# Patient Record
Sex: Male | Born: 1975 | Race: White | Hispanic: No | Marital: Married | Smoking: Never smoker
Health system: Southern US, Community
[De-identification: ages and names within clinical notes are randomized; demographics above are authoritative.]

## PROBLEM LIST (undated history)

## (undated) DIAGNOSIS — T7840XA Allergy, unspecified, initial encounter: Secondary | ICD-10-CM

## (undated) DIAGNOSIS — E119 Type 2 diabetes mellitus without complications: Secondary | ICD-10-CM

## (undated) DIAGNOSIS — Z8489 Family history of other specified conditions: Secondary | ICD-10-CM

## (undated) DIAGNOSIS — F32A Depression, unspecified: Secondary | ICD-10-CM

## (undated) DIAGNOSIS — K623 Rectal prolapse: Secondary | ICD-10-CM

## (undated) DIAGNOSIS — K219 Gastro-esophageal reflux disease without esophagitis: Secondary | ICD-10-CM

## (undated) DIAGNOSIS — S82852A Displaced trimalleolar fracture of left lower leg, initial encounter for closed fracture: Secondary | ICD-10-CM

## (undated) DIAGNOSIS — S83207A Unspecified tear of unspecified meniscus, current injury, left knee, initial encounter: Secondary | ICD-10-CM

## (undated) DIAGNOSIS — F329 Major depressive disorder, single episode, unspecified: Secondary | ICD-10-CM

## (undated) DIAGNOSIS — G4730 Sleep apnea, unspecified: Secondary | ICD-10-CM

## (undated) DIAGNOSIS — E785 Hyperlipidemia, unspecified: Secondary | ICD-10-CM

## (undated) DIAGNOSIS — F419 Anxiety disorder, unspecified: Secondary | ICD-10-CM

## (undated) DIAGNOSIS — I10 Essential (primary) hypertension: Secondary | ICD-10-CM

## (undated) DIAGNOSIS — J302 Other seasonal allergic rhinitis: Secondary | ICD-10-CM

## (undated) HISTORY — DX: Allergy, unspecified, initial encounter: T78.40XA

## (undated) HISTORY — DX: Gastro-esophageal reflux disease without esophagitis: K21.9

## (undated) HISTORY — DX: Rectal prolapse: K62.3

## (undated) HISTORY — DX: Unspecified tear of unspecified meniscus, current injury, left knee, initial encounter: S83.207A

## (undated) HISTORY — PX: FOOT SURGERY: SHX648

## (undated) HISTORY — DX: Anxiety disorder, unspecified: F41.9

## (undated) HISTORY — DX: Sleep apnea, unspecified: G47.30

---

## 2005-08-07 DIAGNOSIS — S83207A Unspecified tear of unspecified meniscus, current injury, left knee, initial encounter: Secondary | ICD-10-CM

## 2005-08-07 HISTORY — DX: Unspecified tear of unspecified meniscus, current injury, left knee, initial encounter: S83.207A

## 2008-11-07 ENCOUNTER — Emergency Department (HOSPITAL_COMMUNITY): Admission: EM | Admit: 2008-11-07 | Discharge: 2008-11-07 | Payer: Self-pay | Admitting: Emergency Medicine

## 2008-11-17 ENCOUNTER — Emergency Department (HOSPITAL_COMMUNITY): Admission: EM | Admit: 2008-11-17 | Discharge: 2008-11-17 | Payer: Self-pay | Admitting: Emergency Medicine

## 2008-12-26 ENCOUNTER — Inpatient Hospital Stay (HOSPITAL_COMMUNITY): Admission: EM | Admit: 2008-12-26 | Discharge: 2008-12-27 | Payer: Self-pay | Admitting: Emergency Medicine

## 2010-06-01 IMAGING — CR DG CHEST 1V PORT
1 series · 1 of 1 positions shown · non-contrast
Comparison: None.

CLINICAL DATA: Chest pain

CHEST - 1 VIEW

[view not recorded]
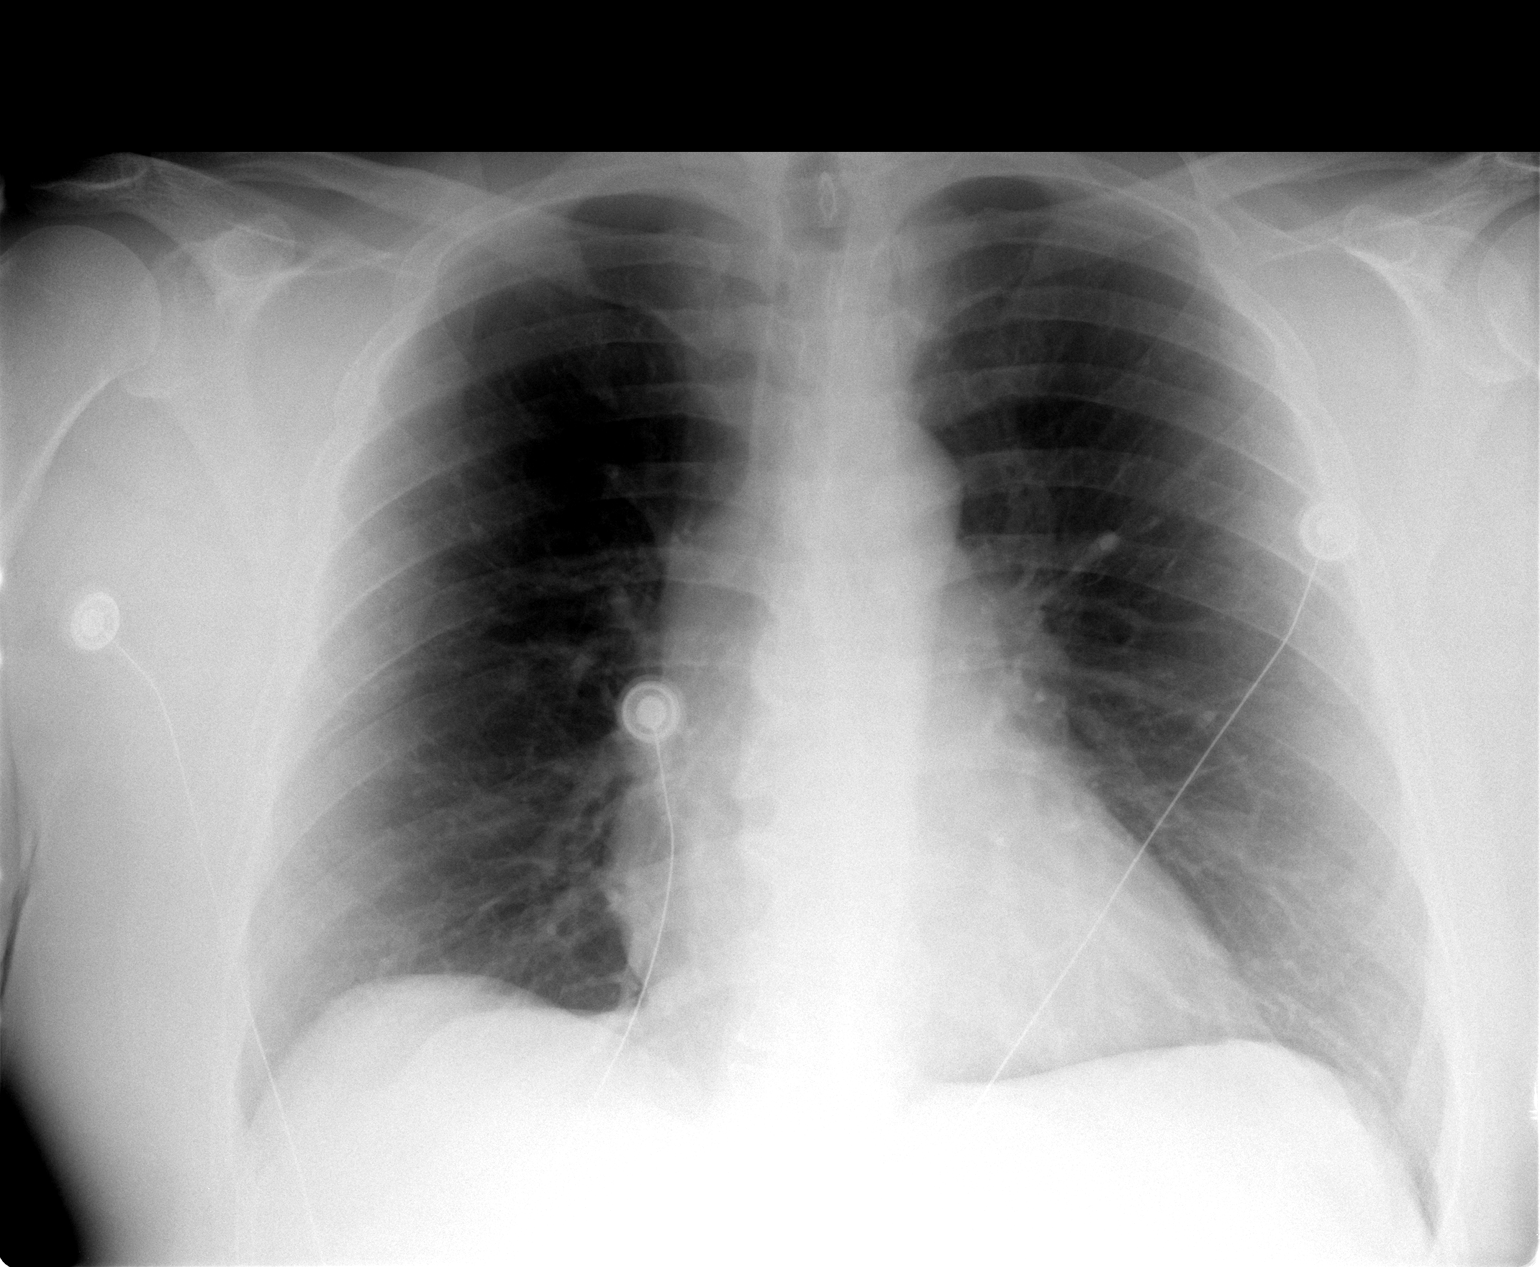

[1 of 1 positions shown; findings below may reference images not displayed]

FINDINGS: The heart size and mediastinal contours are within normal
limits.  Both lungs are clear.
IMPRESSION: No active disease.

## 2010-11-15 LAB — DIFFERENTIAL
Eosinophils Relative: 1 % (ref 0–5)
Lymphocytes Relative: 49 % — ABNORMAL HIGH (ref 12–46)
Lymphs Abs: 2.7 10*3/uL (ref 0.7–4.0)
Monocytes Absolute: 0.4 10*3/uL (ref 0.1–1.0)

## 2010-11-15 LAB — BASIC METABOLIC PANEL
BUN: 13 mg/dL (ref 6–23)
CO2: 27 mEq/L (ref 19–32)
Calcium: 9.1 mg/dL (ref 8.4–10.5)
Chloride: 101 mEq/L (ref 96–112)
Creatinine, Ser: 1.03 mg/dL (ref 0.4–1.5)
GFR calc Af Amer: >60 mL/min (ref >60)
GFR calc non Af Amer: >60 mL/min (ref >60)
Glucose, Bld: 128 mg/dL — ABNORMAL HIGH (ref 70–99)
Potassium: 3.6 mEq/L (ref 3.5–5.1)
Sodium: 135 mEq/L (ref 135–145)

## 2010-11-15 LAB — CARDIAC PANEL(CRET KIN+CKTOT+MB+TROPI)
CK, MB: 2.9 ng/mL (ref 0.3–4.0)
CK, MB: 3.8 ng/mL (ref 0.3–4.0)
Relative Index: 1.5 (ref 0.0–2.5)
Relative Index: 1.6 (ref 0.0–2.5)
Total CK: 167 U/L (ref 7–232)
Total CK: 197 U/L (ref 7–232)
Troponin I: <0.01 ng/mL (ref 0.00–0.06)
Troponin I: <0.01 ng/mL (ref 0.00–0.06)
Troponin I: <0.01 ng/mL (ref 0.00–0.06)

## 2010-11-15 LAB — CBC
HCT: 43.2 % (ref 39.0–52.0)
Hemoglobin: 14.8 g/dL (ref 13.0–17.0)
MCHC: 34.2 g/dL (ref 30.0–36.0)
MCV: 87 fL (ref 78.0–100.0)
Platelets: 256 10*3/uL (ref 150–400)
RBC: 4.97 MIL/uL (ref 4.22–5.81)
RDW: 13.1 % (ref 11.5–15.5)
WBC: 5.6 10*3/uL (ref 4.0–10.5)

## 2010-11-15 LAB — LIPID PANEL
Cholesterol: 145 mg/dL (ref 0–200)
HDL: 19 mg/dL — ABNORMAL LOW (ref >39)
LDL Cholesterol: UNDETERMINED mg/dL (ref 0–99)
Total CHOL/HDL Ratio: 7.6 RATIO
Triglycerides: 431 mg/dL — ABNORMAL HIGH (ref <150)
VLDL: UNDETERMINED mg/dL (ref 0–40)

## 2010-11-15 LAB — GLUCOSE, CAPILLARY
Glucose-Capillary: 132 mg/dL — ABNORMAL HIGH (ref 70–99)
Glucose-Capillary: 139 mg/dL — ABNORMAL HIGH (ref 70–99)
Glucose-Capillary: 143 mg/dL — ABNORMAL HIGH (ref 70–99)

## 2010-11-15 LAB — TROPONIN I: Troponin I: <0.01 ng/mL (ref 0.00–0.06)

## 2010-11-16 LAB — GLUCOSE, CAPILLARY: Glucose-Capillary: 170 mg/dL — ABNORMAL HIGH (ref 70–99)

## 2010-12-20 NOTE — Discharge Summary (Signed)
NAME:  Brad Pineda, Brad Pineda NO.:  [id number]   MEDICAL RECORD NO.:  [id number]          PATIENT TYPE:  INP   LOCATION:  [place]                         FACILITY:  [facility]   PHYSICIAN:  Jacquelynn Cree, M.D.   DATE OF BIRTH:  [birth year]   DATE OF ADMISSION:  12/26/2008  DATE OF DISCHARGE:  12/27/2008                               DISCHARGE SUMMARY   PRIMARY CARE PHYSICIAN:  Dr. Brunetta Jeans at [facility].   DISCHARGE DIAGNOSES:  1. Atypical chest pain.  2. History of hypertension.  3. Dyslipidemia.  4. Type 2 diabetes.  5. Overweight.   DISCHARGE MEDICATIONS:  1. Metformin 500 mg p.o. b.i.d.  2. Pravastatin 40 mg p.o. daily.  3. Paxil 20 mg p.o. daily.  4. Hydrochlorothiazide/lisinopril 12.5/10 mg p.o. daily.  5. Tricor 145 mg p.o. daily.  6. Aspirin 81 mg p.o. daily.   CONSULTATIONS:  None.   Brief admission HPI:  The patient is a 35 year old male with past  medical history of hypertension, type 2 diabetes, and dyslipidemia who  presented to the hospital with chief complaint of stabbing type chest  pain that was atypical and nonexertional.  The patient was admitted for  acute coronary syndrome rule-out given his medical comorbidities.  For  the full details, please see the dictated report done by Dr. Hal Hope.   PROCEDURES AND DIAGNOSTIC STUDIES:  1. CT angiogram of the chest on Dec 26, 2008 was negative for      pulmonary embolism.  2. Chest X-Ray on Dec 24, 2008 showed no active disease.   DISCHARGE LABORATORY VALUES:  Cholesterol was 145, triglycerides 431,  HDL 19, LDL not calculated.  TSH was 0.993.  Hemoglobin A1c was 5.9%.  Cardiac markers were negative times two sets.  However, the first set of  markers did show nonspecific elevation of total CK at 284 and total CK  MB at 4.8.  The index was low at 1.7.  Chemistries were unremarkable  except for a mildly elevated glucose of 128.  CBC was within normal  limits.   HOSPITAL COURSE:  1. Atypical  chest pain:  The patient was admitted secondary to his      comorbidities to rule out acute coronary syndrome.  There is no      family history of early coronary disease.  The patient's risk      factor profile included dyslipidemia, hypertension, type 2      diabetes.  It appears that his blood pressure is well-controlled      and that his diabetes is well-controlled.  A review of his      cholesterol profile does show inadequate control of the      triglycerides and the patient was subsequently put on Tricor for      better control of this.  Given the fact that he is chest pain-free,      that his pain was markedly atypical, that he has had negative      troponins, and that his 12-lead EKG showed normal sinus rhythm with      no ST or T-wave changes, the  patient is felt to be a very low risk      for a cardiac source of his pain.  The patient is therefore being      discharged home with instructions to follow up with his primary      care physician, Dr. Gretel Acre, if he should experience any problems      with ongoing chest pain to be set up for a stress test.  2. Hypertension:  The patient's blood pressure has been well      controlled on his outpatient regimen.  3. Dyslipidemia:  The patient does have an adequate triglyceride      control and he has been put on TriCor to achieve better      triglyceride control.  He was also advised to maintain a heart-      healthy diet and to incorporate exercise into his daily routine.  4. Type 2 diabetes:  The patient's hemoglobin A1c is 5.9% which      corresponds to a mean plasma glucose of 123.  He has excellent      control.  5. Overweight:  The patient does indicate that he exercises regularly      and he has been encouraged to continue to do this.   DISPOSITION:  The patient is medically stable and will be discharged  home to follow up with his primary care physician for his routine  medical care and for any return of problems with chest  pain.   Time spent coordinating care for discharge and discharge instructions  equals 25 minutes.      Jacquelynn Cree, M.D.  Electronically Signed     CR/MEDQ  D:  12/27/2008  T:  12/27/2008  Job:  [id number]   cc:   Dellis Filbert, MD [facility]

## 2010-12-20 NOTE — H&P (Signed)
NAME:  EVERRETT, LACASSE NO.:  [id number]   MEDICAL RECORD NO.:  [id number]          PATIENT TYPE:  EMS   LOCATION:  ED                           [facility]ACILITY[facility]  [facility]   PHYSICIAN:  Rise Patience, MDDATE OF BIRTH:  1976-07-25   DATE OF ADMISSION:  12/26/2008  DATE OF DISCHARGE:                              HISTORY & PHYSICAL   PRIMARY CARE PHYSICIAN:  Brunetta Jeans at [facility].   CHIEF COMPLAINT:  Chest pain.   HISTORY OF PRESENT ILLNESS:  A 35 year old male with a history of  hypertension, diabetes mellitus type 2 and hyperlipidemia presented to  the ER complaining of chest pain.  The patient started out having chest  pain early in the a.m., 6 a.m.; he was walking and he started having  some stabbing type of chest pain, diffusely over the anterior chest.  It  is not particularly related to walking or taking a rest.  It lasts a few  seconds and returns every 15 minutes and he decided to come to the ER.  Presently, the patient has chest pain and has been admitted for further  evaluation.  The patient denies any associated shortness of breath,  nausea, vomiting, diaphoresis, dizziness or loss of consciousness or any  loss of function of limbs.  Denies any headaches, fever or chills,  cough, dysuria, discharge or diarrhea.  Denies any abdominal pain.   PAST MEDICAL HISTORY:  1. Hypertension.  2. Hyperlipidemia.  3. Diabetes mellitus type 2.   PAST SURGICAL HISTORY:  None.   MEDICATIONS ON ADMISSION:  1. Metformin 5 mg p.o. b.i.d.  2. Pravastatin 40 mg daily.  3. Paxil 20 mg daily.  4. Hydrochlorothiazide/lisinopril 1.5/10 mg daily.   ALLERGIES:  NO KNOWN DRUG ALLERGIES.   FAMILY HISTORY:  Noncontributory.  Specifically there is no history of  CAD.   SOCIAL HISTORY:  The patient denies smoking cigarettes, drinking alcohol  or using illegal drugs.   REVIEW OF SYSTEMS:  Per history of presenting illness.  Nothing else of  significance.   PHYSICAL  EXAMINATION:  The patient is examined at bedside, not in acute  distress.  VITAL SIGNS: Blood pressure is 124/78, pulse 86 per minute, temperature  100, respirations 18 per minute, O2 saturation 98%. Marland Kitchen  HEENT: Anicteric.  No pallor.  CHEST:  Bilateral air entry present.  No rhonchi or crepitation.  HEART:  S1, S2 hear.  ABDOMEN:  Soft, nontender.  Bowel sounds heard.  CNS:  Alert, awake and oriented to time, place and person.  Moves upper  and lower extremities 5/5.  EXTREMITIES:  Peripheral pulses felt.  No edema.   LABS:  EKG normal sinus rhythm with no acute ST changes.  Chest X-Ray:  No active disease.  CBC: WBC is 5.6, hemoglobin 14.8, hematocrit 43.2,  platelets 256, neutrophils 43%, glucose 139.  Basic metabolic panel:  Sodium 096, potassium 3.3, chloride 101, carbon dioxide 27, glucose 128,  BUN 13, creatinine 1, CK 284, CK-MB 4.8, troponin-I less than 0.01.   ASSESSMENT:  1. Chest pain, rule-out ACS.  2. Hypertension.  3. Diabetes mellitus type  2.  4. Hyperlipidemia. Marland Kitchen   PLAN:  Admit the patient to Telemetry, cycle cardiac markers.  We will  get a 2-D echocardiogram.  Given the patient's risk factors, the patient  will definitely need a stress test.  We will also get a CT of the chest  to rule out any dissection or embolism.      Rise Patience, MD  Electronically Signed     ANK/MEDQ  D:  12/26/2008  T:  12/26/2008  Job:  [id number]

## 2011-08-21 ENCOUNTER — Encounter: Payer: Self-pay | Admitting: Physician Assistant

## 2011-08-21 DIAGNOSIS — E782 Mixed hyperlipidemia: Secondary | ICD-10-CM | POA: Insufficient documentation

## 2011-08-21 DIAGNOSIS — I10 Essential (primary) hypertension: Secondary | ICD-10-CM | POA: Insufficient documentation

## 2011-08-21 DIAGNOSIS — F419 Anxiety disorder, unspecified: Secondary | ICD-10-CM | POA: Insufficient documentation

## 2011-08-21 DIAGNOSIS — K219 Gastro-esophageal reflux disease without esophagitis: Secondary | ICD-10-CM | POA: Insufficient documentation

## 2011-08-21 DIAGNOSIS — E1165 Type 2 diabetes mellitus with hyperglycemia: Secondary | ICD-10-CM | POA: Insufficient documentation

## 2011-10-17 ENCOUNTER — Ambulatory Visit (INDEPENDENT_AMBULATORY_CARE_PROVIDER_SITE_OTHER): Payer: BC Managed Care – PPO | Admitting: Physician Assistant

## 2011-10-17 ENCOUNTER — Encounter: Payer: Self-pay | Admitting: Physician Assistant

## 2011-10-17 VITALS — BP 121/82 | HR 71 | Temp 97.9°F | Resp 18 | Ht 68.0 in | Wt 212.4 lb

## 2011-10-17 DIAGNOSIS — I10 Essential (primary) hypertension: Secondary | ICD-10-CM

## 2011-10-17 DIAGNOSIS — E782 Mixed hyperlipidemia: Secondary | ICD-10-CM

## 2011-10-17 DIAGNOSIS — Z79899 Other long term (current) drug therapy: Secondary | ICD-10-CM

## 2011-10-17 DIAGNOSIS — E785 Hyperlipidemia, unspecified: Secondary | ICD-10-CM

## 2011-10-17 DIAGNOSIS — E119 Type 2 diabetes mellitus without complications: Secondary | ICD-10-CM

## 2011-10-17 LAB — COMPREHENSIVE METABOLIC PANEL
AST: 32 U/L (ref 0–37)
BUN: 15 mg/dL (ref 6–23)
CO2: 26 mEq/L (ref 19–32)
Calcium: 9.5 mg/dL (ref 8.4–10.5)
Chloride: 101 mEq/L (ref 96–112)
Creat: 1.02 mg/dL (ref 0.50–1.35)
Glucose, Bld: 195 mg/dL — ABNORMAL HIGH (ref 70–99)

## 2011-10-17 LAB — LIPID PANEL
Cholesterol: 131 mg/dL (ref 0–200)
HDL: 24 mg/dL — ABNORMAL LOW (ref >39)
Total CHOL/HDL Ratio: 5.5 Ratio
Triglycerides: 205 mg/dL — ABNORMAL HIGH (ref <150)

## 2011-10-17 MED ORDER — METFORMIN HCL 500 MG PO TABS: 500.0000 mg | ORAL_TABLET | Freq: Two times a day (BID) | ORAL | Status: DC

## 2011-10-17 MED ORDER — METFORMIN HCL 500 MG PO TABS: 1000.0000 mg | ORAL_TABLET | Freq: Two times a day (BID) | ORAL | Status: DC

## 2011-10-17 MED ORDER — NIACIN ER (ANTIHYPERLIPIDEMIC) 500 MG PO TBCR: 500.0000 mg | EXTENDED_RELEASE_TABLET | Freq: Every day | ORAL | Status: DC

## 2011-10-17 MED ORDER — HYDROCHLOROTHIAZIDE 25 MG PO TABS: 25.0000 mg | ORAL_TABLET | Freq: Every day | ORAL | Status: DC

## 2011-10-17 MED ORDER — LOSARTAN POTASSIUM 50 MG PO TABS: 50.0000 mg | ORAL_TABLET | Freq: Every day | ORAL | Status: DC

## 2011-10-17 NOTE — Progress Notes (Signed)
  Subjective:    Patient ID: Brad Pineda, male    DOB: 11/18/1975, 36 y.o.   MRN: [id number]  HPI This patient presents for follow-up of DM, type 2, HTN, elevated lipids.  He reports feeling well, without changes in his life at home or work.  Does not check his blood sugar at home.   Review of Systems  Constitutional: Negative for activity change, appetite change, fatigue and unexpected weight change.  HENT: Negative for hearing loss, ear pain, congestion, sore throat, rhinorrhea, sneezing, mouth sores, trouble swallowing, neck stiffness, dental problem, postnasal drip and tinnitus.   Eyes: Negative for photophobia, pain, redness and visual disturbance.  Respiratory: Negative for cough, chest tightness and shortness of breath.   Cardiovascular: Negative for chest pain, palpitations and leg swelling.  Gastrointestinal: Negative for nausea, vomiting, abdominal pain, diarrhea, constipation and blood in stool.  Genitourinary: Negative for dysuria, urgency, frequency and hematuria.  Musculoskeletal: Negative for myalgias, arthralgias and gait problem.  Skin: Negative for rash.  Neurological: Negative for dizziness, speech difficulty, weakness, light-headedness, numbness and headaches.  Hematological: Negative for adenopathy.  Psychiatric/Behavioral: Negative for confusion and sleep disturbance. The patient is not nervous/anxious.        Objective:   Physical Exam  Vital signs noted. Well-developed, well nourished WM who is awake, alert and oriented, in NAD. HEENT: [place]/AT, PERRL, EOMI.  Sclera and conjunctiva are clear.  EAC are patent, TMs are normal in appearance. Nasal mucosa is pink and moist. OP is clear. Neck: supple, non-tender, no lymphadenopathey, thyromegaly. Heart: RRR, no murmur Lungs: CTA Abdomen: normo-active bowel sounds, supple, non-tender, no mass or organomegaly. Extremities: no cyanosis, clubbing or edema. Skin: warm and dry without rash.       Assessment & Plan:

## 2011-10-17 NOTE — Assessment & Plan Note (Signed)
Controlled.  Continue current treatment plan.

## 2011-10-17 NOTE — Patient Instructions (Signed)
Keep working on healthy eating and regular exercise!

## 2011-10-17 NOTE — Assessment & Plan Note (Signed)
Await labs today.  Continue current treatment plan.

## 2011-10-17 NOTE — Assessment & Plan Note (Signed)
Uncontrolled today with A1C 9.0%.  That's up from 6.0% a year ago!  Increase metformin to 1000 mg BID and re-educated on the importance of healthy eating choices and regular exercise.

## 2011-10-19 ENCOUNTER — Other Ambulatory Visit: Payer: Self-pay | Admitting: *Deleted

## 2011-10-20 ENCOUNTER — Encounter: Payer: Self-pay | Admitting: Physician Assistant

## 2011-10-23 ENCOUNTER — Other Ambulatory Visit: Payer: Self-pay | Admitting: Physician Assistant

## 2011-10-25 ENCOUNTER — Other Ambulatory Visit: Payer: Self-pay | Admitting: Physician Assistant

## 2011-12-07 ENCOUNTER — Encounter: Payer: Self-pay | Admitting: Physician Assistant

## 2011-12-09 ENCOUNTER — Other Ambulatory Visit: Payer: Self-pay | Admitting: Physician Assistant

## 2012-01-23 ENCOUNTER — Encounter: Payer: Self-pay | Admitting: Physician Assistant

## 2012-01-23 ENCOUNTER — Ambulatory Visit (INDEPENDENT_AMBULATORY_CARE_PROVIDER_SITE_OTHER): Payer: BC Managed Care – PPO | Admitting: Physician Assistant

## 2012-01-23 VITALS — BP 127/84 | HR 92 | Temp 98.4°F | Resp 16 | Ht 68.0 in | Wt 206.2 lb

## 2012-01-23 DIAGNOSIS — E785 Hyperlipidemia, unspecified: Secondary | ICD-10-CM

## 2012-01-23 DIAGNOSIS — I10 Essential (primary) hypertension: Secondary | ICD-10-CM

## 2012-01-23 DIAGNOSIS — E782 Mixed hyperlipidemia: Secondary | ICD-10-CM

## 2012-01-23 DIAGNOSIS — K219 Gastro-esophageal reflux disease without esophagitis: Secondary | ICD-10-CM

## 2012-01-23 DIAGNOSIS — F419 Anxiety disorder, unspecified: Secondary | ICD-10-CM

## 2012-01-23 DIAGNOSIS — E119 Type 2 diabetes mellitus without complications: Secondary | ICD-10-CM

## 2012-01-23 LAB — LIPID PANEL
HDL: 25 mg/dL — ABNORMAL LOW (ref >39)
LDL Cholesterol: 70 mg/dL (ref 0–99)
VLDL: 47 mg/dL — ABNORMAL HIGH (ref 0–40)

## 2012-01-23 LAB — COMPREHENSIVE METABOLIC PANEL
AST: 32 U/L (ref 0–37)
Albumin: 4.8 g/dL (ref 3.5–5.2)
Alkaline Phosphatase: 51 U/L (ref 39–117)
BUN: 19 mg/dL (ref 6–23)
Potassium: 3.6 mEq/L (ref 3.5–5.3)
Sodium: 135 mEq/L (ref 135–145)
Total Bilirubin: 0.8 mg/dL (ref 0.3–1.2)

## 2012-01-23 LAB — GLUCOSE, POCT (MANUAL RESULT ENTRY): POC Glucose: 199 mg/dl — AB (ref 70–99)

## 2012-01-23 MED ORDER — PRAVASTATIN SODIUM 40 MG PO TABS: 40.0000 mg | ORAL_TABLET | Freq: Every evening | ORAL | Status: DC

## 2012-01-23 MED ORDER — SITAGLIPTIN PHOSPHATE 100 MG PO TABS: 100.0000 mg | ORAL_TABLET | Freq: Every day | ORAL | Status: DC

## 2012-01-23 NOTE — Progress Notes (Signed)
  Subjective:    Patient ID: Brad Pineda, male    DOB: August 03, 1976, 36 y.o.   MRN: [id number]  HPI Presents for re-evaluation of DM type 2, HTN, elevated lipids (with steatohepatitis), GERD, anxiety.  Nausea and vomiting with increased dose of metformin, so went back to 500 mg BID.  Dizziness if blood sugar drops (when delays/skips meals). Doesn't check frequently.  Not current with DDS and eye exams, due to cost.  Review of Systems No chest pain, SOB, HA, dizziness, vision change, N/V, diarrhea, constipation, dysuria, urinary urgency or frequency, myalgias, arthralgias or rash.     Objective:   Physical Exam  Vital signs noted. Well-developed, well nourished WM who is awake, alert and oriented, in NAD. HEENT: [place]/AT, sclera and conjunctiva are clear.   Neck: supple, non-tender, no lymphadenopathy, thyromegaly. Heart: RRR, no murmur Lungs: CTA Extremities: no cyanosis, clubbing or edema. Skin: warm and dry without rash.  Results for orders placed in visit on 01/23/12  GLUCOSE, POCT (MANUAL RESULT ENTRY)      Component Value Range   POC Glucose 199 (*) 70 - 99 mg/dl  POCT GLYCOSYLATED HEMOGLOBIN (HGB A1C)      Component Value Range   Hemoglobin A1C 9.6          Assessment & Plan:

## 2012-01-23 NOTE — Assessment & Plan Note (Signed)
Continue increasing efforts for healthy lifestyle.  Continue current regimen.

## 2012-01-23 NOTE — Assessment & Plan Note (Signed)
Continue current treatment.

## 2012-01-23 NOTE — Assessment & Plan Note (Signed)
Controlled. Continue increasing efforts for healthy lifestyle.  Continue current regimen.

## 2012-01-23 NOTE — Assessment & Plan Note (Signed)
Intolerant to increased dose of metformin. Add Januvia, 100 mg daily.  Continue metformin at 500 mg BID.  Consider change to Janumet, or a third dose of metformin 500 mg midday.

## 2012-01-24 ENCOUNTER — Encounter: Payer: Self-pay | Admitting: Physician Assistant

## 2012-02-24 ENCOUNTER — Ambulatory Visit (INDEPENDENT_AMBULATORY_CARE_PROVIDER_SITE_OTHER): Payer: BC Managed Care – PPO | Admitting: Internal Medicine

## 2012-02-24 ENCOUNTER — Encounter: Payer: Self-pay | Admitting: Internal Medicine

## 2012-02-24 VITALS — BP 130/88 | HR 86 | Temp 98.1°F | Resp 16 | Ht 68.0 in | Wt 207.6 lb

## 2012-02-24 DIAGNOSIS — J40 Bronchitis, not specified as acute or chronic: Secondary | ICD-10-CM

## 2012-02-24 DIAGNOSIS — R5383 Other fatigue: Secondary | ICD-10-CM

## 2012-02-24 DIAGNOSIS — E119 Type 2 diabetes mellitus without complications: Secondary | ICD-10-CM

## 2012-02-24 DIAGNOSIS — R5381 Other malaise: Secondary | ICD-10-CM

## 2012-02-24 LAB — POCT CBC
HCT, POC: 50.5 % (ref 43.5–53.7)
Hemoglobin: 15.7 g/dL (ref 14.1–18.1)
MCH, POC: 27.8 pg (ref 27–31.2)
MCV: 89.5 fL (ref 80–97)
MID (cbc): 0.5 (ref 0–0.9)
Platelet Count, POC: 8.1 10*3/uL — AB (ref 142–424)
RBC: 5.64 M/uL (ref 4.69–6.13)
WBC: 9.6 10*3/uL (ref 4.6–10.2)

## 2012-02-24 LAB — POCT GLYCOSYLATED HEMOGLOBIN (HGB A1C): Hemoglobin A1C: 8.4

## 2012-02-24 MED ORDER — HYDROCODONE-ACETAMINOPHEN 7.5-500 MG/15ML PO SOLN
5.0000 mL | Freq: Four times a day (QID) | ORAL | Status: AC | PRN
Start: 2012-02-24 — End: 2012-03-05

## 2012-02-24 MED ORDER — AZITHROMYCIN 500 MG PO TABS: 500.0000 mg | ORAL_TABLET | Freq: Every day | ORAL | Status: AC

## 2012-02-24 NOTE — Progress Notes (Signed)
36 year old [place] is here today with complaints of a sorethroat, cough- brown, dizziness, hoarseness, headache,nasal - clear for 3-4 days.  Pt states he has no other complaints.

## 2012-02-24 NOTE — Progress Notes (Signed)
  Subjective:    Patient ID: Brad Pineda, male    DOB: March 16, 1976, 36 y.o.   MRN: [id number]  HPI Has cough green brown sputum, no sob or cp. NIDDM not to goal by his report. Last visit reviewed.   Review of Systems See list    Objective:   Physical Exam HEENT nl Lungs rhonchi, no rales  Results for orders placed in visit on 02/24/12  POCT CBC      Component Value Range   WBC 9.6  4.6 - 10.2 K/uL   Lymph, poc 3.4  0.6 - 3.4   POC LYMPH PERCENT 34.9  10 - 50 %L   MID (cbc) 0.5  0 - 0.9   POC MID % 4.8  0 - 12 %M   POC Granulocyte 5.8  2 - 6.9   Granulocyte percent 60.3  37 - 80 %G   RBC 5.64  4.69 - 6.13 M/uL   Hemoglobin 15.7  14.1 - 18.1 g/dL   HCT, POC 50.5  43.5 - 53.7 %   MCV 89.5  80 - 97 fL   MCH, POC 27.8  27 - 31.2 pg   MCHC 31.1 (*) 31.8 - 35.4 g/dL   RDW, POC 345     Platelet Count, POC 8.1 (*) 142 - 424 K/uL   MPV    0 - 99.8 fL  GLUCOSE, POCT (MANUAL RESULT ENTRY)      Component Value Range   POC Glucose 170 (*) 70 - 99 mg/dl  POCT GLYCOSYLATED HEMOGLOBIN (HGB A1C)      Component Value Range   Hemoglobin A1C 8.4          Assessment & Plan:  Bronchitis NIDDM

## 2012-02-24 NOTE — Patient Instructions (Addendum)

## 2012-02-29 ENCOUNTER — Ambulatory Visit: Payer: BC Managed Care – PPO

## 2012-02-29 ENCOUNTER — Ambulatory Visit (INDEPENDENT_AMBULATORY_CARE_PROVIDER_SITE_OTHER): Payer: BC Managed Care – PPO | Admitting: Emergency Medicine

## 2012-02-29 VITALS — BP 134/60 | HR 96 | Temp 98.6°F | Resp 18 | Ht 68.0 in | Wt 208.0 lb

## 2012-02-29 DIAGNOSIS — R059 Cough, unspecified: Secondary | ICD-10-CM

## 2012-02-29 DIAGNOSIS — R05 Cough: Secondary | ICD-10-CM

## 2012-02-29 DIAGNOSIS — J309 Allergic rhinitis, unspecified: Secondary | ICD-10-CM

## 2012-02-29 MED ORDER — IPRATROPIUM BROMIDE 0.03 % NA SOLN: 2.0000 | Freq: Two times a day (BID) | NASAL | Status: DC

## 2012-02-29 MED ORDER — AMOXICILLIN 875 MG PO TABS: 875.0000 mg | ORAL_TABLET | Freq: Two times a day (BID) | ORAL | Status: AC

## 2012-02-29 MED ORDER — HYDROCOD POLST-CHLORPHEN POLST 10-8 MG/5ML PO LQCR: 5.0000 mL | Freq: Two times a day (BID) | ORAL | Status: DC | PRN

## 2012-02-29 NOTE — Progress Notes (Signed)
  Subjective:    Patient ID: Brad Pineda, male    DOB: 1976/01/25, 36 y.o.   MRN: [id number]  HPI Patient presents for follow up of congestion. He was seen on 7/20 and was given a Z-pack.  Says he feels slightly better but still has nasal congestion, dry cough, sinus pressure, and fatigue.  He has been taking Lortab as needed for cough. Denies fever, chills, nausea, vomiting, otalgia, or wheezing.  Concerned that he did not get an X-ray at last visit.  Also is concerned about why he seems to be getting so many infections (1-2/year)    Review of Systems  All other systems reviewed and are negative.       Objective:   Physical Exam  Constitutional: He is oriented to person, place, and time. He appears well-developed and well-nourished.  HENT:  Head: Normocephalic and atraumatic.  Right Ear: External ear normal.  Left Ear: External ear normal.  Mouth/Throat: Oropharynx is clear and moist. No oropharyngeal exudate.  Eyes: Conjunctivae are normal.  Neck: Normal range of motion.  Cardiovascular: Normal rate, regular rhythm and normal heart sounds.   Pulmonary/Chest: Effort normal and breath sounds normal.  Musculoskeletal: Normal range of motion.  Lymphadenopathy:    He has no cervical adenopathy.  Neurological: He is alert and oriented to person, place, and time.  Psychiatric: He has a normal mood and affect. His behavior is normal. Judgment and thought content normal.    [facility] reading (PRIMARY) by  Dr. Everlene Farrier as normal CXR.       Assessment & Plan:   1. Cough  DG Chest 2 View, amoxicillin (AMOXIL) 875 MG tablet, chlorpheniramine-HYDROcodone (TUSSIONEX PENNKINETIC ER) 10-8 MG/5ML LQCR  2. Allergic rhinitis  ipratropium (ATROVENT) 0.03 % nasal spray   Will treat with Amoxicillin 875 mg bid x 7 days.  Take Tussionex as needed for cough Use Atrovent NS to help with drainage. Take OTC Zyrtec daily also If no improvement after completion of amoxicillin, refer to ENT for further  evaluation

## 2012-04-30 ENCOUNTER — Ambulatory Visit (INDEPENDENT_AMBULATORY_CARE_PROVIDER_SITE_OTHER): Payer: BC Managed Care – PPO | Admitting: Physician Assistant

## 2012-04-30 ENCOUNTER — Other Ambulatory Visit: Payer: Self-pay | Admitting: Internal Medicine

## 2012-04-30 ENCOUNTER — Encounter: Payer: Self-pay | Admitting: Physician Assistant

## 2012-04-30 VITALS — BP 124/84 | HR 90 | Temp 99.0°F | Resp 16 | Ht 67.0 in | Wt 209.2 lb

## 2012-04-30 DIAGNOSIS — I10 Essential (primary) hypertension: Secondary | ICD-10-CM

## 2012-04-30 DIAGNOSIS — E782 Mixed hyperlipidemia: Secondary | ICD-10-CM

## 2012-04-30 DIAGNOSIS — Z23 Encounter for immunization: Secondary | ICD-10-CM

## 2012-04-30 DIAGNOSIS — E119 Type 2 diabetes mellitus without complications: Secondary | ICD-10-CM

## 2012-04-30 LAB — COMPREHENSIVE METABOLIC PANEL
ALT: 66 U/L — ABNORMAL HIGH (ref 0–53)
CO2: 27 mEq/L (ref 19–32)
Creat: 0.97 mg/dL (ref 0.50–1.35)
Total Bilirubin: 0.6 mg/dL (ref 0.3–1.2)

## 2012-04-30 LAB — GLUCOSE, POCT (MANUAL RESULT ENTRY): POC Glucose: 188 mg/dl — AB (ref 70–99)

## 2012-04-30 LAB — LIPID PANEL
Cholesterol: 129 mg/dL (ref 0–200)
HDL: 26 mg/dL — ABNORMAL LOW (ref >39)
Total CHOL/HDL Ratio: 5 Ratio
VLDL: 29 mg/dL (ref 0–40)

## 2012-04-30 LAB — POCT GLYCOSYLATED HEMOGLOBIN (HGB A1C): Hemoglobin A1C: 8

## 2012-04-30 MED ORDER — SITAGLIPTIN PHOSPHATE 100 MG PO TABS: 100.0000 mg | ORAL_TABLET | Freq: Every day | ORAL | Status: DC

## 2012-04-30 MED ORDER — FENOFIBRATE 145 MG PO TABS: 145.0000 mg | ORAL_TABLET | Freq: Every day | ORAL | Status: DC

## 2012-04-30 NOTE — Assessment & Plan Note (Signed)
Reinforced the need to exercise and make healthy eating choices.  Encouraged him to check his glucose at home, which can reveal areas where he can make changes.  Goal A1C is <7.0%.  RTC 3 months.  Will increase medication regimen at that time if needed.

## 2012-04-30 NOTE — Patient Instructions (Signed)
Start checking your blood sugar, so you can use the results to change the way you eat and exercise and get healthier.

## 2012-04-30 NOTE — Assessment & Plan Note (Signed)
Controlled.  Continue current treatment.  RTC 3 months.

## 2012-04-30 NOTE — Assessment & Plan Note (Signed)
Continue current treatment pending lab results.

## 2012-04-30 NOTE — Progress Notes (Signed)
Subjective:    Patient ID: Brad Pineda, male    DOB: 06/23/1976, 36 y.o.   MRN: [id number]  HPI This 36 y.o. male presents for evaluation of DM, HTN, Lipids.  Frequency of home glucose monitoring: NONE Sees a dentist Q6 months, eye specialist annually. Checks his feet daily. Is current with influenza vaccine. Is current with pneumococcal vaccine.   Review of Systems Denies chest pain, shortness of breath, HA, dizziness, vision change, nausea, vomiting, diarrhea, constipation, melena, hematochezia, dysuria, increased urinary urgency or frequency, increased hunger or thirst, unintentional weight change, unexplained myalgias or arthralgias, rash.  Past Medical History  Diagnosis Date  . Type II or unspecified type diabetes mellitus without mention of complication, uncontrolled   . Essential hypertension, benign   . Mixed hyperlipidemia   . NASH (nonalcoholic steatohepatitis) 04/2007  . GERD (gastroesophageal reflux disease)   . Anxiety   . Glaucoma   . History of meniscal tear 2007    History reviewed. No pertinent past surgical history.  Prior to Admission medications   Medication Sig Start Date End Date Taking? Authorizing Provider  aspirin 81 MG tablet Take 81 mg by mouth once. Takes 3 qd   Yes Historical Provider, MD  fenofibrate (TRICOR) 145 MG tablet TAKE 1 TABLET EVERY DAY 10/23/11  Yes Kemper Durie, PA-C  fish oil-omega-3 fatty acids 1000 MG capsule Take 3 g by mouth daily.   Yes Historical Provider, MD  hydrochlorothiazide (HYDRODIURIL) 25 MG tablet Take 1 tablet (25 mg total) by mouth daily. 10/17/11  Yes Janequa Kipnis S Ryanne Morand, PA-C  ipratropium (ATROVENT) 0.03 % nasal spray Place 2 sprays into the nose 2 (two) times daily. 02/29/12 02/28/13 Yes Heather M Marte, PA-C  lansoprazole (PREVACID) 15 MG capsule Take 15 mg by mouth daily. Rotates with Zegrid OTC 20 mg   Yes Historical Provider, MD  losartan (COZAAR) 50 MG tablet Take 1 tablet (50 mg total) by mouth daily. 10/17/11   Yes Ephrata Verville S Joeziah Voit, PA-C  metFORMIN (GLUCOPHAGE) 500 MG tablet TAKE 1 TABLET TWICE A DAY WITH FOOD 12/09/11  Yes Shakia Sebastiano S Javel Hersh, PA-C  Multiple Vitamins-Minerals (MULTIVITAL PO) Take 1 tablet by mouth daily.   Yes Historical Provider, MD  niacin (NIASPAN) 500 MG CR tablet Take 1 tablet (500 mg total) by mouth at bedtime. 10/17/11  Yes Linley Moskal S Kielee Care, PA-C  Omeprazole-Sodium Bicarbonate (ZEGERID PO) Take by mouth once.   Yes Historical Provider, MD  PARoxetine (PAXIL) 40 MG tablet TAKE 1 TABLET EVERY DAY 10/23/11  Yes Kemper Durie, PA-C  pravastatin (PRAVACHOL) 40 MG tablet Take 1 tablet (40 mg total) by mouth every evening. 01/23/12  Yes Lusia Greis S Ralph Brouwer, PA-C  sitaGLIPtin (JANUVIA) 100 MG tablet Take 1 tablet (100 mg total) by mouth daily. 01/23/12 01/22/13 Yes Edmundo Tedesco S Manuelito Poage, PA-C    No Known Allergies  History   Social History  . Marital Status: Married    Spouse Name: [name]    Number of Children: 0  . Years of Education: N/A   Occupational History  .  [employer]   Social History Main Topics  . Smoking status: Never Smoker   . Smokeless tobacco: Never Used  . Alcohol Use: No  . Drug Use: No  . Sexually Active: Yes -- Male partner(s)   Family History  Problem Relation Age of Onset  . Kidney disease Mother   . Diabetes Father   . Hypertension Father        Objective:   Physical Exam  Blood  pressure 124/84, pulse 90, temperature 99 F (37.2 C), temperature source Oral, resp. rate 16, height 5' 7"  (1.702 m), weight 209 lb 3.2 oz (94.892 kg), SpO2 97.00%. Body mass index is 32.77 kg/(m^2). Well-developed, well nourished WM who is awake, alert and oriented, in NAD. HEENT: [place]/AT, PERRL, EOMI.  Sclera and conjunctiva are clear.  EAC are patent, TMs are normal in appearance. Nasal mucosa is pink and moist. OP is clear. Neck: supple, non-tender, no lymphadenopathy, thyromegaly. Heart: RRR, no murmur Lungs: normal effort, CTA Extremities: no cyanosis, clubbing or  edema. Skin: warm and dry without rash. See DM foot exam.  Results for orders placed in visit on 04/30/12  GLUCOSE, POCT (MANUAL RESULT ENTRY)      Component Value Range   POC Glucose 188 (*) 70 - 99 mg/dl  POCT GLYCOSYLATED HEMOGLOBIN (HGB A1C)      Component Value Range   Hemoglobin A1C 8.0         Assessment & Plan:   1. Type II or unspecified type diabetes mellitus without mention of complication, uncontrolled  POCT glucose (manual entry), POCT glycosylated hemoglobin (Hb A1C), Comprehensive metabolic panel, Microalbumin, urine  2. Essential hypertension, benign    3. Mixed hyperlipidemia  Lipid panel, fenofibrate (TRICOR) 145 MG tablet  4. Type II or unspecified type diabetes mellitus without mention of complication, not stated as uncontrolled  sitaGLIPtin (JANUVIA) 100 MG tablet  5. Need for prophylactic vaccination and inoculation against influenza  Flu vaccine greater than or equal to 3yo preservative free IM

## 2012-05-02 ENCOUNTER — Encounter: Payer: Self-pay | Admitting: Physician Assistant

## 2012-05-19 ENCOUNTER — Other Ambulatory Visit: Payer: Self-pay | Admitting: Physician Assistant

## 2012-07-23 ENCOUNTER — Ambulatory Visit (INDEPENDENT_AMBULATORY_CARE_PROVIDER_SITE_OTHER): Payer: BC Managed Care – PPO | Admitting: Physician Assistant

## 2012-07-23 ENCOUNTER — Encounter: Payer: Self-pay | Admitting: Physician Assistant

## 2012-07-23 VITALS — BP 120/84 | HR 87 | Temp 97.5°F | Resp 16 | Ht 68.5 in | Wt 216.0 lb

## 2012-07-23 DIAGNOSIS — I10 Essential (primary) hypertension: Secondary | ICD-10-CM

## 2012-07-23 DIAGNOSIS — IMO0001 Reserved for inherently not codable concepts without codable children: Secondary | ICD-10-CM

## 2012-07-23 DIAGNOSIS — K219 Gastro-esophageal reflux disease without esophagitis: Secondary | ICD-10-CM

## 2012-07-23 DIAGNOSIS — R809 Proteinuria, unspecified: Secondary | ICD-10-CM

## 2012-07-23 DIAGNOSIS — F411 Generalized anxiety disorder: Secondary | ICD-10-CM

## 2012-07-23 DIAGNOSIS — E782 Mixed hyperlipidemia: Secondary | ICD-10-CM

## 2012-07-23 DIAGNOSIS — F419 Anxiety disorder, unspecified: Secondary | ICD-10-CM

## 2012-07-23 LAB — LIPID PANEL
Cholesterol: 128 mg/dL (ref 0–200)
Total CHOL/HDL Ratio: 5.1 Ratio
Triglycerides: 170 mg/dL — ABNORMAL HIGH (ref <150)
VLDL: 34 mg/dL (ref 0–40)

## 2012-07-23 LAB — COMPREHENSIVE METABOLIC PANEL
CO2: 24 mEq/L (ref 19–32)
Creat: 1.03 mg/dL (ref 0.50–1.35)
Glucose, Bld: 184 mg/dL — ABNORMAL HIGH (ref 70–99)
Total Bilirubin: 0.7 mg/dL (ref 0.3–1.2)

## 2012-07-23 NOTE — Patient Instructions (Signed)
GO TO THE GYM! MAKE HEALTHY EATING CHOICES!

## 2012-07-23 NOTE — Progress Notes (Signed)
Subjective:    Patient ID: Brad Pineda, male    DOB: 1976-07-05, 36 y.o.   MRN: [id number]  HPI This 36 y.o. male presents for evaluation of chronic medical problems: Patient Active Problem List  Diagnosis  . Type II or unspecified type diabetes mellitus without mention of complication, uncontrolled  . Essential hypertension, benign  . Mixed hyperlipidemia  . GERD (gastroesophageal reflux disease)  . Anxiety   Frequency of home glucose monitoring: not checking Sees a dentist Q6 months (next visit 09/2012), eye specialist annually (next visit spring 2014). Checks feet daily. Is current with influenza vaccine. Is current with pneumococcal vaccine.  Also, several weeks ago at work, he bumped into something and developed a tender lump under the RIGHT knee cap.  The tenderness resolved, but he still has a bump there.  Past Medical History  Diagnosis Date  . Type II or unspecified type diabetes mellitus without mention of complication, uncontrolled   . Essential hypertension, benign   . Mixed hyperlipidemia   . NASH (nonalcoholic steatohepatitis) 04/2007  . GERD (gastroesophageal reflux disease)   . Anxiety   . Glaucoma(365)   . History of meniscal tear 2007    History reviewed. No pertinent past surgical history.  Prior to Admission medications   Medication Sig Start Date End Date Taking? Authorizing Provider  aspirin 81 MG tablet Take 81 mg by mouth once. Takes 3 qd   Yes Historical Provider, MD  fenofibrate (TRICOR) 145 MG tablet Take 1 tablet (145 mg total) by mouth daily. 04/30/12  Yes Yajayra Feldt S Amish Mintzer, PA-C  fish oil-omega-3 fatty acids 1000 MG capsule Take 3 g by mouth daily.   Yes Historical Provider, MD  hydrochlorothiazide (HYDRODIURIL) 25 MG tablet Take 1 tablet (25 mg total) by mouth daily. 10/17/11  Yes Naavya Postma S Zyion Doxtater, PA-C  ipratropium (ATROVENT) 0.03 % nasal spray Place 2 sprays into the nose 2 (two) times daily. 02/29/12 02/28/13 Yes Heather M Marte, PA-C  JANUVIA  100 MG tablet TAKE 1 TABLET (100 MG TOTAL) BY MOUTH DAILY. 05/19/12  Yes Eleanore Kurtis Bushman, PA-C  lansoprazole (PREVACID) 15 MG capsule Take 15 mg by mouth daily. Rotates with Zegrid OTC 20 mg   Yes Historical Provider, MD  losartan (COZAAR) 50 MG tablet Take 1 tablet (50 mg total) by mouth daily. 10/17/11  Yes Aayra Hornbaker S Ladavia Lindenbaum, PA-C  metFORMIN (GLUCOPHAGE) 500 MG tablet TAKE 1 TABLET TWICE A DAY WITH FOOD 12/09/11  Yes Larone Kliethermes S Lucile Hillmann, PA-C  Multiple Vitamins-Minerals (MULTIVITAL PO) Take 1 tablet by mouth daily.   Yes Historical Provider, MD  niacin (NIASPAN) 500 MG CR tablet Take 1 tablet (500 mg total) by mouth at bedtime. 10/17/11  Yes Pruitt Taboada S Lyrical Sowle, PA-C  Omeprazole-Sodium Bicarbonate (ZEGERID PO) Take by mouth once.   Yes Historical Provider, MD  PARoxetine (PAXIL) 40 MG tablet TAKE 1 TABLET EVERY DAY 10/23/11  Yes Kemper Durie, PA-C  pravastatin (PRAVACHOL) 40 MG tablet Take 1 tablet (40 mg total) by mouth every evening. 01/23/12  Yes Donnalee Cellucci S Renesmee Raine, PA-C    No Known Allergies  History   Social History  . Marital Status: Married    Spouse Name: [name]    Number of Children: 0  . 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 Smoking status: Never Smoker   . Smokeless tobacco: Never Used  . Alcohol Use: No  . Drug Use: No  . Sexually Active:  Yes -- Male partner(s)   Other Topics Concern  . Not on file   Social History Narrative   Lives with his wife.    Family History  Problem Relation Age of Onset  . Hypertension Mother   . Diabetes Mother   . Kidney disease Father     Review of Systems Denies chest pain, shortness of breath, HA, dizziness, vision change, nausea, vomiting, diarrhea, constipation, melena, hematochezia, dysuria, increased urinary urgency or frequency, increased hunger or thirst, unintentional weight change, unexplained myalgias or arthralgias, rash.     Objective:   Physical  Exam Blood pressure 120/84, pulse 87, temperature 97.5 F (36.4 C), resp. rate 16, height 5' 8.5" (1.74 m), weight 216 lb (97.977 kg). Body mass index is 32.36 kg/(m^2). Well-developed, well nourished WM who is awake, alert and oriented, in NAD. HEENT: [place]/AT, PERRL, EOMI.  Sclera and conjunctiva are clear.  EAC are patent, TMs are normal in appearance. Nasal mucosa is pink and moist. OP is clear. Neck: supple, non-tender, no lymphadenopathy, thyromegaly. Heart: RRR, no murmur Lungs: normal effort, CTA Abdomen: normo-active bowel sounds, supple, non-tender, no mass or organomegaly. Extremities: no cyanosis, clubbing or edema. There is a firm knot at the insertion of the patellar tendon on the tibia.  Non-tender.  No color changes. Skin: warm and dry without rash. Psychologic: good mood and appropriate affect, normal speech and behavior.  Results for orders placed in visit on 07/23/12  GLUCOSE, POCT (MANUAL RESULT ENTRY)      Component Value Range   POC Glucose 199 (*) 70 - 99 mg/dl  POCT GLYCOSYLATED HEMOGLOBIN (HGB A1C)      Component Value Range   Hemoglobin A1C 8.7        Assessment & Plan:   1. Type II or unspecified type diabetes mellitus without mention of complication, uncontrolled  POCT glucose (manual entry), POCT glycosylated hemoglobin (Hb A1C), Comprehensive metabolic panel; EXERCISE and HEALTHY EATING!!!  If no better at next visit, plan to D/C Januvia and start Victoza.  2. Essential hypertension, benign  Controlled.  Continue current treatment  3. Mixed hyperlipidemia  Lipid panel. Continue current treatment pending lab results.  4. Anxiety  Controlled.  Continue current regimen  5. GERD (gastroesophageal reflux disease)  Controlled.  6. Microalbuminuria  Microalbumin, urine

## 2012-07-24 ENCOUNTER — Encounter: Payer: Self-pay | Admitting: Physician Assistant

## 2012-07-24 MED ORDER — ENALAPRIL MALEATE 5 MG PO TABS: 5.0000 mg | ORAL_TABLET | Freq: Every day | ORAL | Status: DC

## 2012-07-24 NOTE — Addendum Note (Signed)
Addended by: Fara Chute on: 07/24/2012 12:47 PM   Modules accepted: Orders

## 2012-07-27 ENCOUNTER — Other Ambulatory Visit: Payer: Self-pay | Admitting: Physician Assistant

## 2012-08-01 ENCOUNTER — Other Ambulatory Visit: Payer: Self-pay | Admitting: Internal Medicine

## 2012-08-25 ENCOUNTER — Ambulatory Visit (INDEPENDENT_AMBULATORY_CARE_PROVIDER_SITE_OTHER): Payer: Managed Care, Other (non HMO) | Admitting: Family Medicine

## 2012-08-25 VITALS — BP 136/79 | HR 138 | Temp 98.6°F | Resp 16 | Ht 68.5 in | Wt 212.0 lb

## 2012-08-25 DIAGNOSIS — R05 Cough: Secondary | ICD-10-CM

## 2012-08-25 DIAGNOSIS — J069 Acute upper respiratory infection, unspecified: Secondary | ICD-10-CM

## 2012-08-25 DIAGNOSIS — E119 Type 2 diabetes mellitus without complications: Secondary | ICD-10-CM

## 2012-08-25 LAB — POCT CBC
HCT, POC: 47.5 % (ref 43.5–53.7)
Lymph, poc: 2.3 (ref 0.6–3.4)
MCHC: 32.2 g/dL (ref 31.8–35.4)
MCV: 88.9 fL (ref 80–97)
POC LYMPH PERCENT: 38.4 %L (ref 10–50)
RDW, POC: 13.4 %

## 2012-08-25 LAB — POCT URINALYSIS DIPSTICK
Blood, UA: NEGATIVE
Glucose, UA: 500
Nitrite, UA: NEGATIVE
Protein, UA: NEGATIVE
Urobilinogen, UA: 1
pH, UA: 6

## 2012-08-25 LAB — COMPREHENSIVE METABOLIC PANEL
AST: 40 U/L — ABNORMAL HIGH (ref 0–37)
Albumin: 4.7 g/dL (ref 3.5–5.2)
BUN: 15 mg/dL (ref 6–23)
Calcium: 9.4 mg/dL (ref 8.4–10.5)
Chloride: 96 mEq/L (ref 96–112)
Potassium: 4 mEq/L (ref 3.5–5.3)

## 2012-08-25 LAB — POCT INFLUENZA A/B: Influenza B, POC: NEGATIVE

## 2012-08-25 MED ORDER — GLIPIZIDE ER 5 MG PO TB24: ORAL_TABLET | ORAL | Status: DC

## 2012-08-25 MED ORDER — AMOXICILLIN-POT CLAVULANATE 875-125 MG PO TABS: 1.0000 | ORAL_TABLET | Freq: Two times a day (BID) | ORAL | Status: DC

## 2012-08-25 NOTE — Progress Notes (Signed)
[name], [name]  [address]   [phone]  Subjective:    Patient ID: Brad Pineda, male    DOB: [birth year], 37 y.o.   MRN: [id number]  HPIThis 37 y.o. male presents for evaluation of cough, sinus congestion.  Onset three days ago.  No fever; no chills/sweats.  +HA.  No ear pain.  +ST scratchy.  +rhinorrhea; +nasal congestion.  +PND.  +coughing; +SOB; no n/v/d.  No rash. Taking decongestant yesterday; Zyrtec-D two days ago with some relief.  No sugar check with acute illness.  S/p flu vaccine.  +body aches mild.    DMII:  Not checking sugars regularly; starting with nocturia x 3-4 for past two weeks.  Usually baseline nocturia x 1-2.  Polyuria and polydipsia for past few months.  Compliance with Januvia; taking Metformin.  Previously took higher dose of Metformin but suffered with GI side effects.  Has glucometer at home.  B:  Soup chicken noodle, whole sleeve of crackers, Gatorade.  Lunch:  Sub[employer] ham and [place], baked chips, diet coke.  Snack: none  Supper:  Three hamburgers from [facility], gatorade.   Snack: ice cream.      Review of Systems  Constitutional: Negative for fever, chills, diaphoresis and fatigue.  HENT: Positive for congestion, sore throat, rhinorrhea, sneezing and postnasal drip. Negative for ear pain and sinus pressure.   Respiratory: Positive for cough and shortness of breath. Negative for wheezing and stridor.   Gastrointestinal: Negative for nausea, vomiting and diarrhea.  Skin: Negative for rash.  Neurological: Negative for headaches.        Past Medical History  Diagnosis Date  . Type II or unspecified type diabetes mellitus without mention of complication, uncontrolled   . Essential hypertension, benign   . Mixed hyperlipidemia   . NASH (nonalcoholic steatohepatitis) 04/2007  . GERD (gastroesophageal reflux disease)   . Anxiety   . Glaucoma(365)   . History of meniscal tear 2007    History reviewed. No pertinent past surgical  history.  Prior to Admission medications   Medication Sig Start Date End Date Taking? Authorizing Provider  aspirin 81 MG tablet Take 81 mg by mouth once. Takes 3 qd    Historical Provider, MD  enalapril (VASOTEC) 5 MG tablet Take 1 tablet (5 mg total) by mouth daily. 07/24/12   Chelle S Jeffery, PA-C  fenofibrate (TRICOR) 145 MG tablet Take 1 tablet (145 mg total) by mouth daily. 04/30/12   Chelle Janalee Dane, PA-C  fish oil-omega-3 fatty acids 1000 MG capsule Take 3 g by mouth daily.    Historical Provider, MD  hydrochlorothiazide (HYDRODIURIL) 25 MG tablet Take 1 tablet (25 mg total) by mouth daily. 10/17/11   Chelle S Jeffery, PA-C  ipratropium (ATROVENT) 0.03 % nasal spray Place 2 sprays into the nose 2 (two) times daily. 02/29/12 02/28/13  Heather M Marte, PA-C  JANUVIA 100 MG tablet TAKE 1 TABLET (100 MG TOTAL) BY MOUTH DAILY. 05/19/12   Eleanore Kurtis Bushman, PA-C  lansoprazole (PREVACID) 15 MG capsule Take 15 mg by mouth daily. Rotates with Zegrid OTC 20 mg    Historical Provider, MD  losartan (COZAAR) 50 MG tablet Take 1 tablet (50 mg total) by mouth daily. 10/17/11   Chelle Janalee Dane, PA-C  metFORMIN (GLUCOPHAGE) 500 MG tablet TAKE 1 TABLET TWICE A DAY WITH FOOD 12/09/11   Chelle S Jeffery, PA-C  Multiple Vitamins-Minerals (MULTIVITAL PO) Take 1 tablet by mouth daily.    Historical Provider, MD  NIASPAN 500 MG CR tablet TAKE ONE TABLET BY MOUTH ONE TIME DAILY 07/27/12   Argentina Donovan, PA-C  Omeprazole-Sodium Bicarbonate (ZEGERID PO) Take by mouth once.    Historical Provider, MD  PARoxetine (PAXIL) 40 MG tablet TAKE 1 TABLET EVERY DAY 08/01/12   Chelle S Jeffery, PA-C  pravastatin (PRAVACHOL) 40 MG tablet Take 1 tablet (40 mg total) by mouth every evening. 01/23/12   Chelle Janalee Dane, PA-C    No Known Allergies  History   Social History  . Marital Status: Married    Spouse Name: [name]    Number of Children: 0  . 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 Smoking status: Never Smoker   . Smokeless tobacco: Never Used  . Alcohol Use: No  . Drug Use: No  . Sexually Active: Yes -- Male partner(s)   Other Topics Concern  . Not on file   Social History Narrative   Lives with his wife.    Family History  Problem Relation Age of Onset  . Hypertension Mother   . Diabetes Mother   . Kidney disease Father     Objective:   Physical Exam  Nursing note and vitals reviewed. Constitutional: He is oriented to person, place, and time. He appears well-developed and well-nourished. No distress.  HENT:  Head: Normocephalic and atraumatic.  Right Ear: External ear normal.  Left Ear: External ear normal.  Nose: Nose normal.  Mouth/Throat: Oropharynx is clear and moist.  Eyes: Conjunctivae normal are normal. Pupils are equal, round, and reactive to light.  Neck: Normal range of motion. Neck supple.  Cardiovascular: Regular rhythm and normal heart sounds.        Pulse 110.  Pulmonary/Chest: Effort normal and breath sounds normal. He has no decreased breath sounds. He has no wheezes. He has no rhonchi. He has no rales.  Lymphadenopathy:    He has no cervical adenopathy.  Neurological: He is alert and oriented to person, place, and time.  Skin: Skin is warm and dry. No rash noted. He is not diaphoretic.  Psychiatric: He has a normal mood and affect. His behavior is normal.   Results for orders placed in visit on 08/25/12  POCT CBC      Component Value Range   WBC 6.0  4.6 - 10.2 K/uL   Lymph, poc 2.3  0.6 - 3.4   POC LYMPH PERCENT 38.4  10 - 50 %L   MID (cbc) 0.3  0 - 0.9   POC MID % 5.2  0 - 12 %M   POC Granulocyte 3.4  2 - 6.9   Granulocyte percent 56.4  37 - 80 %G   RBC 5.34  4.69 - 6.13 M/uL   Hemoglobin 15.3  14.1 - 18.1 g/dL   HCT, POC 47.5  43.5 - 53.7 %   MCV 88.9  80 - 97 fL   MCH, POC 28.7  27 - 31.2 pg   MCHC 32.2  31.8 - 35.4 g/dL   RDW, POC 13.4     Platelet Count, POC 296  142 -  424 K/uL   MPV 8.5  0 - 99.8 fL  POCT INFLUENZA A/B      Component Value Range   Influenza A, POC Negative     Influenza B, POC Negative    GLUCOSE, POCT (MANUAL RESULT ENTRY)      Component Value Range   POC  Glucose 351 (*) 70 - 99 mg/dl        Assessment & Plan:   1. Diabetes  POCT glucose (manual entry), Comprehensive metabolic panel, glipiZIDE (GLUCOTROL XL) 5 MG 24 hr tablet, POCT urinalysis dipstick  2. Cough  POCT CBC, POCT Influenza A/B  3. Acute upper respiratory infections of unspecified site  amoxicillin-clavulanate (AUGMENTIN) 875-125 MG per tablet     1.  URI:  New.  Treat with Augmentin 882m bid.  Continue Zyrtec D and Atrovent nasal spray. 2.  DMII: uncontrolled.  ADD Glipizide ER 555mdaily.  Check sugars daily.  Dietary modification reviewed in detail; to avoid sweetened beverages; to avoid excessive carbohydrate intake.  Contact office for sugars > 400.    Meds ordered this encounter  Medications  . amoxicillin-clavulanate (AUGMENTIN) 875-125 MG per tablet    Sig: Take 1 tablet by mouth 2 (two) times daily.    Dispense:  20 tablet    Refill:  0  . glipiZIDE (GLUCOTROL XL) 5 MG 24 hr tablet    Sig: One tablet daily with largest meal of the day.    Dispense:  30 tablet    Refill:  5

## 2012-08-25 NOTE — Patient Instructions (Addendum)
1. Diabetes  POCT glucose (manual entry), Comprehensive metabolic panel, glipiZIDE (GLUCOTROL XL) 5 MG 24 hr tablet, POCT urinalysis dipstick  2. Cough  POCT CBC, POCT Influenza A/B  3. Acute upper respiratory infections of unspecified site  amoxicillin-clavulanate (AUGMENTIN) 875-125 MG per tablet     1. CALL OFFICE FOR SUGAR > 400. 2.  CHECK SUGAR DAILY.

## 2012-10-11 ENCOUNTER — Other Ambulatory Visit: Payer: Self-pay | Admitting: Physician Assistant

## 2012-10-29 ENCOUNTER — Other Ambulatory Visit: Payer: Self-pay | Admitting: *Deleted

## 2012-10-29 ENCOUNTER — Telehealth: Payer: Self-pay | Admitting: *Deleted

## 2012-10-29 ENCOUNTER — Ambulatory Visit (INDEPENDENT_AMBULATORY_CARE_PROVIDER_SITE_OTHER): Payer: Managed Care, Other (non HMO) | Admitting: Physician Assistant

## 2012-10-29 ENCOUNTER — Encounter: Payer: Self-pay | Admitting: Physician Assistant

## 2012-10-29 VITALS — BP 118/78 | HR 90 | Temp 97.9°F | Resp 16 | Ht 67.0 in | Wt 215.0 lb

## 2012-10-29 DIAGNOSIS — F518 Other sleep disorders not due to a substance or known physiological condition: Secondary | ICD-10-CM

## 2012-10-29 DIAGNOSIS — R809 Proteinuria, unspecified: Secondary | ICD-10-CM

## 2012-10-29 DIAGNOSIS — F419 Anxiety disorder, unspecified: Secondary | ICD-10-CM

## 2012-10-29 DIAGNOSIS — G479 Sleep disorder, unspecified: Secondary | ICD-10-CM

## 2012-10-29 DIAGNOSIS — IMO0001 Reserved for inherently not codable concepts without codable children: Secondary | ICD-10-CM

## 2012-10-29 DIAGNOSIS — K219 Gastro-esophageal reflux disease without esophagitis: Secondary | ICD-10-CM

## 2012-10-29 DIAGNOSIS — I10 Essential (primary) hypertension: Secondary | ICD-10-CM

## 2012-10-29 DIAGNOSIS — G4710 Hypersomnia, unspecified: Secondary | ICD-10-CM

## 2012-10-29 DIAGNOSIS — G4761 Periodic limb movement disorder: Secondary | ICD-10-CM

## 2012-10-29 DIAGNOSIS — R0683 Snoring: Secondary | ICD-10-CM

## 2012-10-29 DIAGNOSIS — E782 Mixed hyperlipidemia: Secondary | ICD-10-CM

## 2012-10-29 DIAGNOSIS — F411 Generalized anxiety disorder: Secondary | ICD-10-CM

## 2012-10-29 LAB — COMPREHENSIVE METABOLIC PANEL
Albumin: 4.9 g/dL (ref 3.5–5.2)
Alkaline Phosphatase: 41 U/L (ref 39–117)
BUN: 20 mg/dL (ref 6–23)
CO2: 28 mEq/L (ref 19–32)
Calcium: 9.9 mg/dL (ref 8.4–10.5)
Chloride: 98 mEq/L (ref 96–112)
Glucose, Bld: 132 mg/dL — ABNORMAL HIGH (ref 70–99)
Potassium: 4.1 mEq/L (ref 3.5–5.3)
Sodium: 135 mEq/L (ref 135–145)
Total Protein: 7.1 g/dL (ref 6.0–8.3)

## 2012-10-29 LAB — LIPID PANEL
HDL: 29 mg/dL — ABNORMAL LOW (ref >39)
LDL Cholesterol: 82 mg/dL (ref 0–99)
Triglycerides: 123 mg/dL (ref <150)

## 2012-10-29 MED ORDER — HYDROCHLOROTHIAZIDE 25 MG PO TABS: 25.0000 mg | ORAL_TABLET | Freq: Every day | ORAL | Status: DC

## 2012-10-29 MED ORDER — LOSARTAN POTASSIUM 50 MG PO TABS: 50.0000 mg | ORAL_TABLET | Freq: Every day | ORAL | Status: DC

## 2012-10-29 MED ORDER — METFORMIN HCL 500 MG PO TABS: 500.0000 mg | ORAL_TABLET | Freq: Two times a day (BID) | ORAL | Status: DC

## 2012-10-29 MED ORDER — ENALAPRIL MALEATE 5 MG PO TABS: 5.0000 mg | ORAL_TABLET | Freq: Every day | ORAL | Status: DC

## 2012-10-29 NOTE — Patient Instructions (Signed)
Keep up the good work-healthy eating and regular exercise are the key.  Continue your current medications. If you have not heard anything regarding the referral in 10 days, please contact our office.

## 2012-10-29 NOTE — Progress Notes (Signed)
Subjective:    Patient ID: Brad Pineda, male    DOB: [birth year], 37 y.o.   MRN: [id number]  HPI This 37 y.o. male presents for evaluation of DM type 2, HTN, hyperlipidemia.   He was seen in January for sinusitis.  Glucose was quite elevated, so glipizide ER was added to his regimen.  He's tolerating it without difficulty.    His dentist, who has made him a mouth guard due to bruxism, has suggested that he may have sleep apnea.  He reportedly snores, but denies apnea reports from his wife.  He wakes every couple of hours during the night, either to urinate or "just because I wake up." Doesn't feel well rested upon waking in the morning.  Frequency of home glucose monitoring: not checking  Sees a dentist Q6 months, eye specialist annually.  Checks feet daily.  Is current with influenza vaccine.  Is current with pneumococcal vaccine.  Past Medical History  Diagnosis Date  . Type II or unspecified type diabetes mellitus without mention of complication, uncontrolled   . Essential hypertension, benign   . Mixed hyperlipidemia   . NASH (nonalcoholic steatohepatitis) 04/2007  . GERD (gastroesophageal reflux disease)   . Anxiety   . Glaucoma(365)   . History of meniscal tear 2007    History reviewed. No pertinent past surgical history.  Prior to Admission medications   Medication Sig Start Date End Date Taking? Authorizing Provider  aspirin 81 MG tablet Take 81 mg by mouth once. Takes 3 qd   Yes Historical Provider, MD  cetirizine (ZYRTEC) 10 MG tablet Take 10 mg by mouth daily.   Yes Historical Provider, MD  enalapril (VASOTEC) 5 MG tablet Take 1 tablet (5 mg total) by mouth daily. 07/24/12  Yes Gaetano Romberger S Janard Culp, PA-C  fenofibrate (TRICOR) 145 MG tablet Take 1 tablet (145 mg total) by mouth daily. 04/30/12  Yes Ylianna Almanzar S Fue Cervenka, PA-C  fish oil-omega-3 fatty acids 1000 MG capsule Take 3 g by mouth daily.   Yes Historical Provider, MD  glipiZIDE (GLUCOTROL XL) 5 MG 24 hr tablet One  tablet daily with largest meal of the day. 08/25/12  Yes Wardell Honour, MD  hydrochlorothiazide (HYDRODIURIL) 25 MG tablet Take 1 tablet (25 mg total) by mouth daily. 10/17/11  Yes Fannie Alomar S Avelina Mcclurkin, PA-C  ipratropium (ATROVENT) 0.03 % nasal spray Place 2 sprays into the nose 2 (two) times daily. 02/29/12 02/28/13 Yes Heather M Marte, PA-C  JANUVIA 100 MG tablet TAKE 1 TABLET (100 MG TOTAL) BY MOUTH DAILY. 05/19/12  Yes Eleanore E Egan, PA-C  losartan (COZAAR) 50 MG tablet Take 1 tablet (50 mg total) by mouth daily. 10/17/11  Yes Zalen Sequeira S Dashon Mcintire, PA-C  metFORMIN (GLUCOPHAGE) 500 MG tablet TAKE 1 TABLET TWICE A DAY WITH FOOD 12/09/11  Yes Teresha Hanks S Daphene Chisholm, PA-C  Multiple Vitamins-Minerals (MULTIVITAL PO) Take 1 tablet by mouth daily.   Yes Historical Provider, MD  NIASPAN 500 MG CR tablet TAKE ONE TABLET BY MOUTH ONE TIME DAILY 07/27/12  Yes Dionne Bucy McClung, PA-C  Omeprazole-Sodium Bicarbonate (ZEGERID PO) Take by mouth once.   Yes Historical Provider, MD  PARoxetine (PAXIL) 40 MG tablet TAKE 1 TABLET EVERY DAY 08/01/12  Yes Lulia Schriner S Kayonna Lawniczak, PA-C  pravastatin (PRAVACHOL) 40 MG tablet TAKE 1 TABLET (40 MG TOTAL) BY MOUTH EVERY EVENING. 10/11/12  Yes Traycen Goyer S Cayde Held, PA-C    No Known Allergies  History   Social History  . Marital Status: Married    Spouse Name: [name]  Number of Children: 0  . 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 Smoking status: Never Smoker   . Smokeless tobacco: Never Used  . Alcohol Use: No  . Drug Use: No  . Sexually Active: Yes -- Male partner(s)   Other Topics Concern  . Not on file   Social History Narrative   Lives with his wife.    Family History  Problem Relation Age of Onset  . Hypertension Mother   . Diabetes Mother   . Kidney disease Father      Review of Systems Denies chest pain, shortness of breath, HA, dizziness, vision change, nausea, vomiting, diarrhea,  constipation, melena, hematochezia, dysuria, increased urinary urgency or frequency, increased hunger or thirst, unintentional weight change, unexplained myalgias or arthralgias, rash.     Objective:   Physical Exam Blood pressure 118/78, pulse 90, temperature 97.9 F (36.6 C), temperature source Oral, resp. rate 16, height 5' 7"  (1.702 m), weight 215 lb (97.523 kg), SpO2 96.00%. Body mass index is 33.67 kg/(m^2). Well-developed, well nourished WM who is awake, alert and oriented, in NAD. HEENT: [place]/AT, PERRL, EOMI.  Sclera and conjunctiva are clear.  EAC are patent, TMs are normal in appearance. Nasal mucosa is pink and moist. OP is clear. Neck: supple, non-tender, no lymphadenopathy, thyromegaly. Heart: RRR, no murmur Lungs: normal effort, CTA Abdomen: normo-active bowel sounds, supple, non-tender, no mass or organomegaly. Extremities: no cyanosis, clubbing or edema. See DM foot exam. Skin: warm and dry without rash. Psychologic: good mood and appropriate affect, normal speech and behavior.    Results for orders placed in visit on 10/29/12  GLUCOSE, POCT (MANUAL RESULT ENTRY)      Result Value Range   POC Glucose 132 (*) 70 - 99 mg/dl  POCT GLYCOSYLATED HEMOGLOBIN (HGB A1C)      Result Value Range   Hemoglobin A1C 7.9         Assessment & Plan:  Type II or unspecified type diabetes mellitus without mention of complication, uncontrolled - Plan: POCT glucose (manual entry), POCT glycosylated hemoglobin (Hb A1C), Comprehensive metabolic panel, Microalbumin, urine, enalapril (VASOTEC) 5 MG tablet, metFORMIN (GLUCOPHAGE) 500 MG tablet. He's unclear why his A1C has improved since January, and is surprised when I suggest that it's due to the addition of glipizide. He's going to re-invest in healthy eating and regular exercise, and if we don't see continued improvement, will increase the glipizide dose from 5 mg to 10 mg.  Essential hypertension, benign - Plan: enalapril (VASOTEC) 5 MG  tablet, hydrochlorothiazide (HYDRODIURIL) 25 MG tablet, losartan (COZAAR) 50 MG tablet  Mixed hyperlipidemia - Plan: Lipid panel  GERD (gastroesophageal reflux disease) - stable  Anxiety - stable.  Sleep disorder - Plan: Ambulatory referral to Sleep Studies. If he does have OSA and can be treated, I would expect to see an improvement in BP and glucose.

## 2012-10-29 NOTE — Telephone Encounter (Signed)
Spoke with pt, administered Epworth Sleepiness Scale:  How likely are you to doze in the following situations: 0 = not likely, 1 = slight chance, 2 = moderate chance, 3 = high chance  Sitting and Reading? 2 Watching Television? 2 Sitting inactive in a public place (theater or meeting)? 3 Lying down in the afternoon when circumstances permit? 3 Sitting and talking to someone? 1 Sitting quietly after lunch without alcohol? 1 In a car, while stopped for a few minutes in traffic? 0 As a passenger in a car for an hour without a break? 1  Total = 13/24  Pt reports the following unwanted behaviors during sleep:  Sleep talking, kicking and jerking or legs during sleep.  Discussed with pt [employer]'s auth process, due to the potential for PLMD a request will be made for an in-lab study which can take several days.  Explained to him that if they deny the auth, we may need to bring him in for a Sleep Consult appt with sleep specialist to determine additional information or proceed with Home Sleep Test which is approved through [facility] easily.  Pt understood, asked questions about our location, wrote down our phone number, will call if further questions.  -sh

## 2012-11-06 NOTE — Addendum Note (Signed)
Addended by: Constance Goltz on: 11/06/2012 07:22 PM   Modules accepted: Orders

## 2012-11-29 ENCOUNTER — Other Ambulatory Visit: Payer: Self-pay | Admitting: Physician Assistant

## 2012-11-30 ENCOUNTER — Other Ambulatory Visit: Payer: Self-pay | Admitting: Physician Assistant

## 2012-12-02 ENCOUNTER — Telehealth: Payer: Self-pay | Admitting: *Deleted

## 2012-12-02 NOTE — Telephone Encounter (Signed)
Asked patient to call back to schedule HST set up appt.  Explained Christella Scheuermann will cover 100% after $25 copay.  Asked him to call back ASAP. -sh

## 2012-12-02 NOTE — Telephone Encounter (Signed)
Message copied by Cathi Roan on Mon Dec 02, 2012  2:48 PM ------      Message from: Dory Horn      Created: Mon Dec 02, 2012 12:58 PM      Regarding: [place] patient has been approved for home sleep test.  He will have a $25 copayment and coverage is 100%.  Please call pt to set up, thanks! ------

## 2012-12-04 ENCOUNTER — Ambulatory Visit (INDEPENDENT_AMBULATORY_CARE_PROVIDER_SITE_OTHER): Payer: Managed Care, Other (non HMO) | Admitting: *Deleted

## 2012-12-04 DIAGNOSIS — G4710 Hypersomnia, unspecified: Secondary | ICD-10-CM

## 2012-12-04 DIAGNOSIS — R0989 Other specified symptoms and signs involving the circulatory and respiratory systems: Secondary | ICD-10-CM

## 2012-12-04 DIAGNOSIS — G4733 Obstructive sleep apnea (adult) (pediatric): Secondary | ICD-10-CM

## 2012-12-04 DIAGNOSIS — R0683 Snoring: Secondary | ICD-10-CM

## 2012-12-04 NOTE — Addendum Note (Signed)
Addended by: Jacklynn Bue B on: 12/04/2012 02:11 PM   Modules accepted: Orders

## 2012-12-04 NOTE — Progress Notes (Signed)
Patient arrives for HST instruction.  Patient is given written instructions, picture instructions, and a demonstration on how to use HST unit.  All questions / concerns were addressed by technologist.  Financial responsibility was explained - pt has [employer], they would not authorize an in lab attended Split Night study which was originally ordered despite the patient having history of PLMD.  Christella Scheuermann will pay 100% after $25 copay for HST to screen for OSA.  Follow up information was given to patient regarding how results would be received.  Pt will be scheduled ASAP to discuss results and recommendations with Dr. Rexene Alberts or Dr. Brett Fairy.  Copay was collected, consent obtained.  Pt planning on doing study tonight and will return unit tomorrow for analysis. -S. Manasvi Dickard, RPSGT

## 2012-12-10 ENCOUNTER — Ambulatory Visit (INDEPENDENT_AMBULATORY_CARE_PROVIDER_SITE_OTHER): Payer: Managed Care, Other (non HMO) | Admitting: Neurology

## 2012-12-10 ENCOUNTER — Encounter: Payer: Self-pay | Admitting: Neurology

## 2012-12-10 VITALS — BP 126/76 | HR 68 | Ht 68.0 in | Wt 217.0 lb

## 2012-12-10 DIAGNOSIS — G4733 Obstructive sleep apnea (adult) (pediatric): Secondary | ICD-10-CM | POA: Insufficient documentation

## 2012-12-10 DIAGNOSIS — G4730 Sleep apnea, unspecified: Secondary | ICD-10-CM

## 2012-12-10 DIAGNOSIS — G4731 Primary central sleep apnea: Secondary | ICD-10-CM

## 2012-12-10 NOTE — Progress Notes (Signed)
See media tab for full report  Pt showed overall AHI of 27/hr with O2 desaturations as low as 79%. Patient spent 51 minutes of test with saturations below 89% and 26 minutes with saturations below 88%.     This represents moderate to severe sleep apnea with obstructive and central components.  10 apneas were scored, 6 were obstructive apnea, 4 were central apneas  Long hypopneas and significant flow limitations and snoring were observed in record associated with oxygen desaturations.

## 2012-12-10 NOTE — Patient Instructions (Signed)
Exercise to Lose Weight Exercise and a healthy diet may help you lose weight. Your doctor may suggest specific exercises. EXERCISE IDEAS AND TIPS  Choose low-cost things you enjoy doing, such as walking, bicycling, or exercising to workout videos.  Take stairs instead of the elevator.  Walk during your lunch break.  Park your car further away from work or school.  Go to a gym or an exercise class.  Start with 5 to 10 minutes of exercise each day. Build up to 30 minutes of exercise 4 to 6 days a week.  Wear shoes with good support and comfortable clothes.  Stretch before and after working out.  Work out until you breathe harder and your heart beats faster.  Drink extra water when you exercise.  Do not do so much that you hurt yourself, feel dizzy, or get very short of breath. Exercises that burn about 150 calories:  Running 1  miles in 15 minutes.  Playing volleyball for 45 to 60 minutes.  Washing and waxing a car for 45 to 60 minutes.  Playing touch football for 45 minutes.  Walking 1  miles in 35 minutes.  Pushing a stroller 1  miles in 30 minutes.  Playing basketball for 30 minutes.  Raking leaves for 30 minutes.  Bicycling 5 miles in 30 minutes.  Walking 2 miles in 30 minutes.  Dancing for 30 minutes.  Shoveling snow for 15 minutes.  Swimming laps for 20 minutes.  Walking up stairs for 15 minutes.  Bicycling 4 miles in 15 minutes.  Gardening for 30 to 45 minutes.  Jumping rope for 15 minutes.  Washing windows or floors for 45 to 60 minutes. Document Released: 08/26/2010 Document Revised: 10/16/2011 Document Reviewed: 08/26/2010 [facility] Patient Information 2013 [place], [place]. 1200 Calorie Diabetic Diet The 1200 calorie diabetic diet limits calories to 1200 each day. Following this diet and making healthy meal choices can help improve overall health. It controls blood glucose (sugar) levels and can also help lower blood pressure and cholesterol.   SERVING SIZES Measuring foods and serving sizes helps to make sure you are getting the right amount of food. The list below tells how big or small some common serving sizes are.   1 oz.........4 stacked dice.  3 oz........Marland KitchenDeck of cards.  1 tsp.......Marland KitchenTip of little finger.  1 tbs......Marland KitchenMarland KitchenThumb.  2 tbs.......Marland KitchenGolf ball.   cup......Marland KitchenHalf of a fist.  1 cup.......Marland KitchenA fist. GUIDELINES FOR CHOOSING FOODS The goal of this diet is to eat a variety of foods and limit calories to 1200 each day. This can be done by choosing foods that are low in calories and fat. The diet also suggests eating small amounts of food frequently. Doing this helps control your blood glucose levels, so they do not get too high or too low. Each meal or snack may include a protein food source to help you feel more satisfied. Try to eat about the same amount of food around the same time each day. This includes weekend days, travel days, and days off work. Space your meals about 4 to 5 hours apart, and add a snack between them, if you wish.  For example, a daily food plan could include breakfast, a morning snack, lunch, dinner, and an evening snack. Healthy meals and snacks have different types of foods, including whole grains, vegetables, fruits, lean meats, poultry, fish, and dairy products. As you plan your meals, select a variety of foods. Choose from the bread and starch, vegetable, fruit, dairy, and meat/protein groups. Examples  of foods from each group are listed below, with their suggested serving sizes. Use measuring cups and spoons to become familiar with what a healthy portion looks like. Bread and Starch Each serving equals 15 grams of carbohydrate.  1 slice bread.   bagel.   cup cold cereal (unsweetened).   cup hot cereal or mashed potatoes.  1 small potato (size of a computer mouse).   cup cooked pasta or rice.   English muffin.  1 cup broth-based soup.  3 cups of popcorn.  4 to 6 whole-wheat  crackers.   cup cooked beans, peas, or corn. Vegetables Each serving equals 5 grams of carbohydrate.   cup cooked vegetables.  1 cup raw vegetables.   cup tomato or vegetable juice. Fruit Each serving equals 15 grams of carbohydrate.  1 small apple or orange.  1  cup watermelon or strawberries.   cup applesauce (no sugar added).  2 tbs raisins.   banana.   cup canned fruit, packed in water or in its own juice.   cup unsweetened fruit juice. Dairy Each serving equals 12 to 15 grams of carbohydrate.  1 cup fat-free milk.  6 oz artificially sweetened yogurt or plain yogurt.  1 cup low-fat buttermilk.  1 cup soy milk.  1 cup almond milk. Meat/Protein  1 large egg.  2 to 3 oz meat, poultry, or fish.   cup low-fat cottage cheese.  1 tbs peanut butter.  1 oz low-fat cheese.   cup tuna, packed in water.   cup tofu. Fat  1 tsp oil.  1 tsp trans-fat-free margarine.  1 tsp butter.  1 tsp mayonnaise.  2 tbs avocado.  1 tbs salad dressing.  1 tbs cream cheese.  2 tbs sour cream. SAMPLE 1200 CALORIE DIET PLAN Breakfast  1 cup fat-free milk (1 carb serving).  1 small orange (1 carb serving).  1 scrambled egg.  1 slice whole-wheat toast (1 carb serving). Lunch  Sandwich  2 slices whole-wheat bread (2 carb servings).  2 oz lean meat.  2 tsp reduced fat mayonnaise.  1 lettuce leaf.  2 slices tomato.  1 cup carrot sticks. Afternoon Snack  1 small apple (1 carb serving).  1 string cheese. Dinner  2 oz meat.  1 small baked potato (1 carb serving).  1 tsp trans-fat-free margarine.  1 cup steamed broccoli.  1 cup fat-free milk (1 carb serving). Evening Snack   small banana (1 carb serving).  6 vanilla wafers (1 carb serving). MEAL PLAN You can use this worksheet to help you make a daily meal plan based on the 1200 calorie diabetic diet suggestions. If you are using this plan to help you control your blood  glucose, you may interchange carbohydrate containing foods (dairy, starches, and fruits). Select a variety of fresh foods of varying colors and flavors. The total amount of carbohydrate in your meals or snacks is more important than making sure you include all of the food groups every time you eat. You can choose from approximately this many of the following foods to build your day's meals:  6 Starches.  3 Vegetables.  2 Fruits.  2 Dairy.  4 to 6 oz Meat/Protein.  Up to 3 Fats. Your dietician can use this worksheet to help you decide how many servings and which types of foods are right for you. BREAKFAST Food Group and Servings / Food Choice Starch _________________________________________________________ Dairy __________________________________________________________ Fruit ___________________________________________________________ Meat/Protein____________________________________________________ Fat ____________________________________________________________ LUNCH Food Group and Servings / Food Choice  Starch _________________________________________________________ Meat/Protein ___________________________________________________ Vegetables _____________________________________________________ Fruit __________________________________________________________ Dairy __________________________________________________________ Fat ____________________________________________________________ Wilhemina Bonito Food Group and Servings / Food Choice Dairy __________________________________________________________ Hortense Ramal ___________________________________________________________ Starch __________________________________________________________ Meat/Protein____________________________________________________ Wonda Cheng Food Group and Servings / Food Choice Starch _________________________________________________________ Meat/Protein ___________________________________________________ Dairy  __________________________________________________________ Vegetables _____________________________________________________ Fruit __________________________________________________________ Fat ____________________________________________________________ Fermin Schwab Food Group and Servings / Food Choice Fruit ___________________________________________________________ Meat/Protein ____________________________________________________ Dairy __________________________________________________________ Starch __________________________________________________________ DAILY TOTALS Starches _________________________ Vegetables _______________________ Fruits ____________________________ Dairy ____________________________ Meat/Protein______________________ Fats _____________________________ Document Released: 02/13/2005 Document Revised: 10/16/2011 Document Reviewed: 06/10/2009 [employer] Patient Information 2013 [place], [place].

## 2012-12-10 NOTE — Progress Notes (Addendum)
[facility]  Provider:  Dr Brett Fairy Referring Provider: Damita Lack Primary Care Physician:  JEFFERY,CHELLE, PA-C  SLEEP DISORDERED BREATHING.  HPI:  Brad Pineda is a 37 y.o. male here as a referral from [place] for evaluation of possible sleep apnea.   Prior to today's visit patient underwent a calm sleep test, but diagonals apnea was 27 apneas per hour of sleep. This would place the patient in the moderate sleep apnea category. It appears that all events were obstructive and not central apneas. The patient has an elevated body mass index, a medical history of diabetes mellitus, and depression. He is also treated for mixed hyperlipidemia and anxiety-depression. The patient endorsed the Epworth  sleepiness score at 13 point in March when he saw his primary care provider. Sleepiness score was endorsed at 5 points. 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 This in addition will contribute to his sleep disorder. He reports weight gain over the last 2 years and has elevated body mass index of the main risk factor for oSA. He endorses nocturia, loud snoring but not witnessed apneas. The patient reports that he pops in his sleep. Again his age as he confirmed the diagnosis of apnea on 12/05/2012. He used to drink caffeine in the morning to increase his alertness and wakefulness. The patient has an average bedtime at about midnight to 1 AM, but after he initiates sleep wakes promptly and often can only sleep one hour at a time. His sleep was highly fragmented as reported and was found to all related likely to apnea.  The patient reports that he is a supine sleeper.    Review of Systems: Out of a complete 14 system review, the patient complains of only the following symptoms, and all other reviewed systems are negative. Fragmented sleep, non restorative sleep, snoring. Nocturia.  Had headaches in childhood, but none since his mid twenties.  Vivid dreams.    patient reports at this time no  difficulties with sleepiness interfering with the operation of machines or a car.    History   Social History  . Marital Status: Married    Spouse Name: [name]    Number of Children: 0  . 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 Smoking status: Never Smoker   . Smokeless tobacco: Never Used  . Alcohol Use: No  . Drug Use: No  . Sexually Active: Yes -- Male partner(s)   Other Topics Concern  . Not on file   Social History Narrative   Lives with his wife.    Family History  Problem Relation Age of Onset  . Hypertension Mother   . Diabetes Mother   . Kidney disease Father     Past Medical History  Diagnosis Date  . Type II or unspecified type diabetes mellitus without mention of complication, uncontrolled   . Essential hypertension, benign   . Mixed hyperlipidemia   . NASH (nonalcoholic steatohepatitis) 04/2007  . GERD (gastroesophageal reflux disease)   . Anxiety   . Glaucoma(89)   . History of meniscal tear 2007    History reviewed. No pertinent past surgical history.  Current Outpatient Prescriptions  Medication Sig Dispense Refill  . cetirizine (ZYRTEC) 10 MG tablet Take 10 mg by mouth daily.      . enalapril (VASOTEC) 5 MG tablet Take 1 tablet (5 mg total) by mouth daily.  90 tablet  3  . hydrochlorothiazide (HYDRODIURIL) 25 MG tablet Take 1 tablet (25 mg total) by mouth daily.  90 tablet  3  . ipratropium (ATROVENT) 0.03 % nasal spray Place 2 sprays into the nose 2 (two) times daily.  30 mL  5  . JANUVIA 100 MG tablet TAKE 1 TABLET EVERY DAY  30 tablet  5  . losartan (COZAAR) 50 MG tablet Take 1 tablet (50 mg total) by mouth daily.  90 tablet  3  . metFORMIN (GLUCOPHAGE) 500 MG tablet Take 1 tablet (500 mg total) by mouth 2 (two) times daily with a meal.  180 tablet  3  . NIASPAN 500 MG CR tablet TAKE ONE TABLET BY MOUTH ONE TIME  DAILY  90 tablet  2  . PARoxetine (PAXIL) 40 MG tablet TAKE 1 TABLET EVERY DAY  90 tablet  2  . aspirin 81 MG tablet Take 81 mg by mouth once. Takes 3 qd      . fenofibrate (TRICOR) 145 MG tablet Take 1 tablet (145 mg total) by mouth daily.  90 tablet  3  . fish oil-omega-3 fatty acids 1000 MG capsule Take 3 g by mouth daily.      Marland Kitchen glipiZIDE (GLUCOTROL XL) 5 MG 24 hr tablet One tablet daily with largest meal of the day.  30 tablet  5  . Multiple Vitamins-Minerals (MULTIVITAL PO) Take 1 tablet by mouth daily.      Earney Navy Bicarbonate (ZEGERID PO) Take by mouth once.      . pravastatin (PRAVACHOL) 40 MG tablet TAKE 1 TABLET (40 MG TOTAL) BY MOUTH EVERY EVENING.  90 tablet  1   No current facility-administered medications for this visit.    Allergies as of 12/10/2012  . (No Known Allergies)    Vitals: There were no vitals taken for this visit. Last Weight:  Wt Readings from Last 1 Encounters:  10/29/12 215 lb (97.523 kg)   Last Height:   Ht Readings from Last 1 Encounters:  10/29/12 5' 7"  (1.702 m)   Vision Screening:  Left eye with correction .  Right eye with correction vitals . Marland Kitchen  Physical exam:  General: The patient is awake, alert and appears not in acute distress. The patient is well groomed. Head: Normocephalic, atraumatic. Neck is supple. Mallampati3 , neck circumference 18.5 , thick short neck, no retrognathia , nasal congestion in allergy season. Bruxism - uses mouth guard.  Cardiovascular:  Regular rate and rhythm\, without  murmurs or carotid bruit, and without distended neck veins. Respiratory: Lungs are clear to auscultation. Skin:  Without evidence of edema, or rash Trunk: BMI is\ elevated and patient  has normal posture.  Neurologic exam : The patient is awake and alert, oriented to place and time.  Memory subjective \ described as intact. There is a normal attention span & concentration ability. Speech is fluent without  dysarthria, dysphonia or  aphasia. Mood and affect are appropriate.  Cranial nerves: Pupils are equal and briskly reactive to light. Funduscopic exam without  evidence of pallor or edema. Extraocular movements  in vertical and horizontal planes intact and without nystagmus. Visual fields by finger perimetry are intact. Hearing to finger rub intact.  Facial sensation intact to fine touch. Facial motor strength is symmetric and tongue and uvula move midline.  Motor exam:   Normal tone and normal muscle bulk and symmetric normal strength in all extremities.  Sensory:  Fine touch, pinprick and vibration were tested in all extremities. Proprioception  is tested in the upper extremities only. This was  normal.  Coordination: Rapid alternating movements in the fingers/hands is tested and normal. Finger-to-nose maneuver tested and normal without evidence of ataxia, dysmetria or tremor.  Gait and station: Patient walks without assistive device and is able and assisted stool climb up to the exam table. Strength within normal limits. Stance is stable and normal. Tandem gait is normal.   Deep tendon reflexes: in the  upper and lower extremities are symmetric and intact. DTR brisk and symmetric.        Assessment:  After physical and neurologic examination, review of laboratory studies, imaging, neurophysiology testing and pre-existing records, assessment will be reviewed on the problem list.  Plan:  Treatment plan and additional workup will be reviewed under Problem List.  This patient was diagnosed by a home sleep test is moderately severe apnea. I will initiate a titration study and give some dietary and exercise protocol to facilitate weight loss.

## 2013-01-06 ENCOUNTER — Ambulatory Visit (INDEPENDENT_AMBULATORY_CARE_PROVIDER_SITE_OTHER): Payer: Managed Care, Other (non HMO) | Admitting: Neurology

## 2013-01-06 DIAGNOSIS — G4733 Obstructive sleep apnea (adult) (pediatric): Secondary | ICD-10-CM

## 2013-01-06 DIAGNOSIS — G4731 Primary central sleep apnea: Secondary | ICD-10-CM

## 2013-01-20 ENCOUNTER — Other Ambulatory Visit: Payer: Self-pay | Admitting: Neurology

## 2013-01-20 DIAGNOSIS — G4733 Obstructive sleep apnea (adult) (pediatric): Secondary | ICD-10-CM

## 2013-01-26 ENCOUNTER — Telehealth: Payer: Self-pay | Admitting: *Deleted

## 2013-01-26 NOTE — Telephone Encounter (Signed)
Called patient to discuss sleep study results from  01/06/2013.  Discussed findings, recommendations and follow up care.  Patient understood well and all questions were answered. WE DISCUSSED AUTO PAP IN DETAIL AND HOW HE WOULD GET HIS MACHINE.  ORDERS WILL BE FORWARDED ON Tuesday TO DME THAT WORKS WITH HIS INSURANCE COMPANY [employer].  We discussed FFM and he says he felt less claustrophobic with it.  Patient already has a follow up appt scheduled on Wednesday with Dr. Brett Fairy.  After our discussion, he still feels strongly that he would like to meet with her and discuss results and map out his plan of care.  I told him that was completely up to him and just fine.  We left the appt in place.  Patient understands that at his appt, he will receive a copy of the report and a letter from me with his DME company and contact information for his information.  I will be sure and leave this info with Dr. Edwena Felty clinic assistant to give to him.  He also understands that before he leaves [facility] Wednesday, he will make a follow up appointment about 6 weeks later so that Dr. Brett Fairy can review his download and see how he is doing on CPAP therapy.

## 2013-01-29 ENCOUNTER — Encounter: Payer: Self-pay | Admitting: Neurology

## 2013-01-29 ENCOUNTER — Ambulatory Visit (INDEPENDENT_AMBULATORY_CARE_PROVIDER_SITE_OTHER): Payer: Managed Care, Other (non HMO) | Admitting: Neurology

## 2013-01-29 VITALS — BP 120/81 | HR 91 | Temp 97.6°F | Ht 68.0 in | Wt 218.0 lb

## 2013-01-29 DIAGNOSIS — G4733 Obstructive sleep apnea (adult) (pediatric): Secondary | ICD-10-CM

## 2013-01-29 NOTE — Assessment & Plan Note (Signed)
Titrated on 01-06-13 to 10 cm water, ordered auto-titration for patient 8-12 cm water at home.

## 2013-01-29 NOTE — Progress Notes (Addendum)
[facility]  Provider:  Dr Raeya Merritts Referring Provider: Damita Lack Primary Care Physician:  JEFFERY,CHELLE, PA-C  Chief Complaint  Patient presents with  . Follow-up    SLEEP STUDY RESULTS, RM 10    HPI:  Brad Pineda is a 37 y.o. male here as a revisit  after recent titration study.    This  patient underwent a HOME sleep test,  AHI  27 apneas per hour of sleep. The home sleep study also showed tachybradycardia arrhythmia a very variable heart rates between 40 beats per minute and 150 beats per minute This would place the patient in the moderate sleep apnea category. It appears that all events were obstructive and not central apneas.  The patient has an elevated body mass index, a medical history of diabetes mellitus, and depression. He is also treated for mixed hyperlipidemia and anxiety-depression. The patient endorsed the Epworth sleepiness score at 13 point in March when he saw his primary care provider. Sleepiness score was endorsed at 5 points. 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 This in addition will contribute to his sleep disorder. He reports weight gain over the last 2 years and has elevated body mass index of the main risk factor for OSA. He endorses nocturia, loud snoring but not witnessed apneas. The patient reports that he pops in his sleep. Again his age as he confirmed the diagnosis of apnea on 12/05/2012. He used to drink caffeine in the morning to increase his alertness and wakefulness. The patient has an average bedtime at about midnight to 1 AM, but after he initiates sleep wakes promptly and often can only sleep one hour at a time. His sleep was highly fragmented as reported and was found to all related likely to apnea.  The patient reports that he is a supine sleeper.    The patient underwent an hour and titration to CPAP on 6-2 2014 at the time and Doris the Epworth score at 13 points. He was titrated to 10 cm  water which opiate as tolerated but he only slept a total of 10 minutes at the setting. The AHI was still 4.3 the oxygen nadir however was 92% under the setting that was no REM sleep noted and all sleep was  supine. I therefore ordered an auto-titration between 8 and 12 cm  Water ,  the patient will use this  at home , we will  follow an auto - titration  95% pressure. 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 He didn't like nasal pillows.  He has  irregular work hours, last night came home at 10.30 PM and finally went to sleep around 2 AM , slept until 7 AM.   Agent reports that during the sleep at night he finally was able to initiate sleep around midnight but once up around 1 AM and then slept until about 5:30. He had one bathroom break. Epworth today ( still without CPAP  )is 6 points.  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   Educational material about CPAP use, the nature of apnea and the risk factors for apnea as well as apnea as a risk factor for heart disease and stroke will be discussed and further information is  made available.  The patient is seen today ,after his MD visit with me, by our Sleep lab manager to discuss the  initiation of CPAP and she will offer him another mask to try , beside the FFM.  Information and education is 15 minutes in addition to the sleep study result discussion.     Review of Systems: Out of a complete 14 system review, the patient complains of only the following symptoms, and all other reviewed systems are negative.  overall low sleep hours nightly , 4-6 hours . Shift worker .   History   Social History  . Marital Status: Married    Spouse Name: [name]    Number of Children: 0  . 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 Smoking status: Never Smoker   . Smokeless  tobacco: Never Used  . Alcohol Use: No  . Drug Use: No  . Sexually Active: Yes -- Male partner(s)   Other Topics Concern  . Not on file   Social History Narrative   Lives with his wife.    Family History  Problem Relation Age of Onset  . Hypertension Mother   . Diabetes Mother   . Kidney disease Father     Past Medical History  Diagnosis Date  . Type II or unspecified type diabetes mellitus without mention of complication, uncontrolled   . Essential hypertension, benign   . Mixed hyperlipidemia   . NASH (nonalcoholic steatohepatitis) 04/2007  . GERD (gastroesophageal reflux disease)   . Anxiety   . Glaucoma   . History of meniscal tear 2007    History reviewed. No pertinent past surgical history.  Current Outpatient Prescriptions  Medication Sig Dispense Refill  . aspirin 81 MG tablet Take 81 mg by mouth once. Takes 3 qd      . cetirizine (ZYRTEC) 10 MG tablet Take 10 mg by mouth daily.      . enalapril (VASOTEC) 5 MG tablet Take 1 tablet (5 mg total) by mouth daily.  90 tablet  3  . fenofibrate (TRICOR) 145 MG tablet Take 1 tablet (145 mg total) by mouth daily.  90 tablet  3  . fish oil-omega-3 fatty acids 1000 MG capsule Take 3 g by mouth daily.      Marland Kitchen glipiZIDE (GLUCOTROL XL) 5 MG 24 hr tablet One tablet daily with largest meal of the day.  30 tablet  5  . hydrochlorothiazide (HYDRODIURIL) 25 MG tablet Take 1 tablet (25 mg total) by mouth daily.  90 tablet  3  . ipratropium (ATROVENT) 0.03 % nasal spray Place 2 sprays into the nose 2 (two) times daily.  30 mL  5  . JANUVIA 100 MG tablet TAKE 1 TABLET EVERY DAY  30 tablet  5  . losartan (COZAAR) 50 MG tablet Take 1 tablet (50 mg total) by mouth daily.  90 tablet  3  . metFORMIN (GLUCOPHAGE) 500 MG tablet Take 1 tablet (500 mg total) by mouth 2 (two) times daily with a meal.  180 tablet  3  . Multiple Vitamins-Minerals (MULTIVITAL PO) Take 1 tablet by mouth daily.      Marland Kitchen NIASPAN 500 MG CR tablet TAKE ONE TABLET BY  MOUTH ONE TIME DAILY  90 tablet  2  . Omeprazole-Sodium Bicarbonate (ZEGERID PO) Take by mouth once.      Marland Kitchen PARoxetine (PAXIL) 40 MG tablet TAKE 1 TABLET EVERY DAY  90 tablet  2  . pravastatin (PRAVACHOL) 40 MG tablet TAKE 1 TABLET (40 MG TOTAL) BY MOUTH EVERY EVENING.  90 tablet  1   No current facility-administered medications for this visit.    Allergies as of 01/29/2013 - Review Complete 01/29/2013  Allergen Reaction Noted  . Seasonal ic (cholestatin)  12/10/2012    Vitals: BP 120/81  Pulse 91  Temp(Src) 97.6 F (36.4 C) (Oral)  Ht 5' 8"  (1.727 m)  Wt 218 lb (98.884 kg)  BMI 33.15 kg/m2 Last Weight:  Wt Readings from Last 1 Encounters:  01/29/13 218 lb (98.884 kg)   Last Height:   Ht Readings from Last 1 Encounters:  01/29/13 5' 8"  (1.727 m)    Mental Status: Alert, oriented, thought content appropriate.  Speech fluent without evidence of aphasia. Able to follow 3 step commands without difficulty. Cranial Nerves: II-Discs flat bilaterally. Visual fields grossly intact. III/IV/VI-Extraocular movements intact.  Pupils reactive bilaterally. V/VII-Smile symmetric VIII-grossly intact IX/X-normal gag XI-bilateral shoulder shrug XII-midline tongue extension Motor: 5/5 bilaterally with normal tone and bulk Sensory: Pinprick and light touch intact throughout, bilaterally Deep Tendon Reflexes: 2+ and symmetric throughout Plantars: Downgoing bilaterally Cerebellar: Normal finger-to-nose, normal rapid alternating movements and normal heel-to-shin test.  Normal gait and station.    Patient will receive an auto titration machine with a window of pressure between 8 cm water and 12 cm water, he can use a mask of his choice. I like to review of the download data after 30 days of use, and assure compliance.

## 2013-01-29 NOTE — Patient Instructions (Signed)
CPAP and BIPAP CPAP and BIPAP are methods of helping you breathe. CPAP stands for "continuous positive airway pressure." BIPAP stands for "bi-level positive airway pressure." Both CPAP and BIPAP are provided by a small machine with a flexible plastic tube that attaches to a plastic mask that goes over your nose or mouth. Air is blown into your air passages through your nose or mouth. This helps to keep your airways open and helps to keep you breathing well. The amount of pressure that is used to blow the air into your air passages can be set on the machine. The pressure setting is based on your needs. With CPAP, the amount of pressure stays the same while you breathe in and out. With BIPAP, the amount of pressure changes when you inhale and exhale. Your caregiver will recommend whether CPAP or BIPAP would be more helpful for you.  CPAP and BIPAP can be helpful for both adults and children with:  Sleep apnea.  Chronic Obstructive Pulmonary Disease (COPD), a condition like emphysema.  Diseases which weaken the muscles of the chest such as muscular dystrophy or neurological diseases.  Other problems that cause breathing to be weak or difficult. USE OF CPAP OR BIPAP The respiratory therapist or technician will help you get used to wearing the mask. Some people feel claustrophobic (a trapped or closed in feeling) at first, because the mask needs to be fairly snug on your face.   It may help you to get used to the mask gradually, by first holding the mask loosely over your nose or mouth using a low pressure setting on the machine. Gradually the mask can be applied more snugly with increased pressure. You can also gradually increase the amount of time the mask is used.  People with sleep apnea will use the mask and machine at night when they are sleeping. Others, like those with ALS or other breathing difficulties, may need the CPAP or BIPAP all the time.  If the first mask you try does not fit well,  or is uncomfortable, there are other types and sizes that can be tried.  If you tend to breathe through your mouth, a chin strap may be applied to help keep your mouth closed (if you are using a nasal mask).  The CPAP and BIPAP machines have alarms that may sound if the mask comes off or develops a leak.  You should not eat or drink while the CPAP or BIPAP is on. Food or fluids could get pushed into your lungs by the pressure of the CPAP or BIPAP. Sometimes CPAP or BIPAP machines are ordered for home use. If you are going to use the CPAP or BIPAP machine at home, follow these instructions  CPAP or BIPAP machines can be rented or purchased through home health care companies. There are many different brands of machines available. If you rent a machine before purchasing you may find which particular machine works well for you.  Ask questions if there is something you do not understand when picking out your machine.  Place your CPAP or BIPAP machine on a secure table or stand near an electrical outlet.  Know where the On/Off switch is.  Follow your doctor's instructions for how to set the pressure on your machine and when you should use it.  Do not smoke! Tobacco smoke residue can damage the machine. SEEK IMMEDIATE MEDICAL CARE IF:   You have redness or open areas around your nose or mouth.  You have trouble  operating the CPAP or BIPAP machine.  You cannot tolerate wearing the CPAP or BIPAP mask.  You have any questions or concerns. Document Released: 04/21/2004 Document Revised: 10/16/2011 Document Reviewed: 07/21/2008 [facility] Patient Information 2014 [place]. Sleep Apnea Sleep apnea is disorder that affects a person's sleep. A person with sleep apnea has abnormal pauses in their breathing when they sleep. It is hard for them to get a good sleep. This makes a person tired during the day. It also can lead to other physical problems. There are three types of sleep apnea. One type  is when breathing stops for a short time because your airway is blocked (obstructive sleep apnea). Another type is when the brain sometimes fails to give the normal signal to breathe to the muscles that control your breathing (central sleep apnea). The third type is a combination of the other two types. HOME CARE  Do not sleep on your back. Try to sleep on your side.  Take all medicine as told by your doctor.  Avoid alcohol, calming medicines (sedatives), and depressant drugs.  Try to lose weight if you are overweight. Talk to your doctor about a healthy weight goal. Your doctor may have you use a device that helps to open your airway. It can help you get the air that you need. It is called a positive airway pressure (PAP) device. There are three types of PAP devices:  Continuous positive airway pressure (CPAP) device.  Nasal expiratory positive airway pressure (EPAP) device.  Bilevel positive airway pressure (BPAP) device. MAKE SURE YOU:  Understand these instructions.  Will watch your condition.  Will get help right away if you are not doing well or get worse. Document Released: 05/02/2008 Document Revised: 07/10/2012 Document Reviewed: 11/25/2011 [facility] Patient Information 2014 [place]. Exercise to Lose Weight Exercise and a healthy diet may help you lose weight. Your doctor may suggest specific exercises. EXERCISE IDEAS AND TIPS  Choose low-cost things you enjoy doing, such as walking, bicycling, or exercising to workout videos.  Take stairs instead of the elevator.  Walk during your lunch break.  Park your car further away from work or school.  Go to a gym or an exercise class.  Start with 5 to 10 minutes of exercise each day. Build up to 30 minutes of exercise 4 to 6 days a week.  Wear shoes with good support and comfortable clothes.  Stretch before and after working out.  Work out until you breathe harder and your heart beats faster.  Drink extra water  when you exercise.  Do not do so much that you hurt yourself, feel dizzy, or get very short of breath. Exercises that burn about 150 calories:  Running 1  miles in 15 minutes.  Playing volleyball for 45 to 60 minutes.  Washing and waxing a car for 45 to 60 minutes.  Playing touch football for 45 minutes.  Walking 1  miles in 35 minutes.  Pushing a stroller 1  miles in 30 minutes.  Playing basketball for 30 minutes.  Raking leaves for 30 minutes.  Bicycling 5 miles in 30 minutes.  Walking 2 miles in 30 minutes.  Dancing for 30 minutes.  Shoveling snow for 15 minutes.  Swimming laps for 20 minutes.  Walking up stairs for 15 minutes.  Bicycling 4 miles in 15 minutes.  Gardening for 30 to 45 minutes.  Jumping rope for 15 minutes.  Washing windows or floors for 45 to 60 minutes. Document Released: 08/26/2010 Document Revised: 10/16/2011  Document Reviewed: 08/26/2010 [facility] Patient Information 2014 [place].

## 2013-01-30 NOTE — Progress Notes (Signed)
See media tab for full report  

## 2013-02-02 ENCOUNTER — Other Ambulatory Visit: Payer: Self-pay | Admitting: Physician Assistant

## 2013-02-04 ENCOUNTER — Ambulatory Visit (INDEPENDENT_AMBULATORY_CARE_PROVIDER_SITE_OTHER): Payer: Managed Care, Other (non HMO) | Admitting: Physician Assistant

## 2013-02-04 ENCOUNTER — Encounter: Payer: Self-pay | Admitting: Physician Assistant

## 2013-02-04 VITALS — BP 112/68 | HR 81 | Temp 98.7°F | Resp 16 | Ht 68.0 in | Wt 218.2 lb

## 2013-02-04 DIAGNOSIS — IMO0001 Reserved for inherently not codable concepts without codable children: Secondary | ICD-10-CM

## 2013-02-04 DIAGNOSIS — E119 Type 2 diabetes mellitus without complications: Secondary | ICD-10-CM

## 2013-02-04 DIAGNOSIS — E785 Hyperlipidemia, unspecified: Secondary | ICD-10-CM

## 2013-02-04 DIAGNOSIS — I10 Essential (primary) hypertension: Secondary | ICD-10-CM

## 2013-02-04 DIAGNOSIS — R809 Proteinuria, unspecified: Secondary | ICD-10-CM

## 2013-02-04 DIAGNOSIS — E782 Mixed hyperlipidemia: Secondary | ICD-10-CM

## 2013-02-04 LAB — COMPREHENSIVE METABOLIC PANEL
Alkaline Phosphatase: 40 U/L (ref 39–117)
Glucose, Bld: 125 mg/dL — ABNORMAL HIGH (ref 70–99)
Sodium: 137 mEq/L (ref 135–145)
Total Bilirubin: 0.5 mg/dL (ref 0.3–1.2)
Total Protein: 6.7 g/dL (ref 6.0–8.3)

## 2013-02-04 LAB — GLUCOSE, POCT (MANUAL RESULT ENTRY): POC Glucose: 147 mg/dl — AB (ref 70–99)

## 2013-02-04 LAB — LIPID PANEL
HDL: 27 mg/dL — ABNORMAL LOW (ref >39)
Total CHOL/HDL Ratio: 4.9 Ratio
Triglycerides: 121 mg/dL (ref <150)
VLDL: 24 mg/dL (ref 0–40)

## 2013-02-04 MED ORDER — ENALAPRIL MALEATE 5 MG PO TABS: ORAL_TABLET | ORAL | Status: DC

## 2013-02-04 MED ORDER — FENOFIBRATE 145 MG PO TABS: 145.0000 mg | ORAL_TABLET | Freq: Every day | ORAL | Status: DC

## 2013-02-04 MED ORDER — PRAVASTATIN SODIUM 40 MG PO TABS: ORAL_TABLET | ORAL | Status: DC

## 2013-02-04 MED ORDER — ENALAPRIL MALEATE 10 MG PO TABS: 10.0000 mg | ORAL_TABLET | Freq: Every day | ORAL | Status: DC

## 2013-02-04 MED ORDER — GLIPIZIDE ER 5 MG PO TB24: ORAL_TABLET | ORAL | Status: DC

## 2013-02-04 NOTE — Progress Notes (Signed)
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  Unclear why he's been on both ACEI and ARB, neither at max dose.  D/C Losartan.  Increase enalapril to 10 mg daily.  Proceed with referral to nephrology due to 3-fold increase in urine microalbumin 10/2012 compared to 07/2012.

## 2013-02-04 NOTE — Patient Instructions (Addendum)
STOP the Losartan. I have increased the enalapril from 5 mg to 10 mg. Please take it daily.  Keep up the work on healthy eating and regular exercise.  The CPAP will help by giving you more energy and helping to improve your metabolism.

## 2013-02-04 NOTE — Progress Notes (Signed)
Subjective:    Patient ID: Brad Pineda, male    DOB: 1975/12/15, 37 y.o.   MRN: [id number]  Hypertension  Diabetes  Hyperlipidemia  Brad Pineda is a 37 YO Caucasian male presenting for a 3 month follow-up on his DM Type II, hypertension, and hyperlipidemia.  He has no current complaints.  He describes being compliant with his medications.  He has requested several refills on medications.  He does not check his blood pressure or blood sugars at home.  He has made no changes to his diet or exercise regimen since his last visit.  Denies chest pain, shortness of breath, headaches, abdominal pain, blurry vision, muscle aches, and numbness/tingling.    In June 2014, he underwent a sleep study and was diagnosed with Sleep Apnea.  Arrangements are currently being made for a CPAP.  He did not schedule with Nephrology due to a problem with his phone.  He has requested an appointment with a Nephrologist that his wife has previously seen.  Past medical history, family history, and social history reviewed.  Past Medical History  Diagnosis Date  . Type II or unspecified type diabetes mellitus without mention of complication, uncontrolled   . Essential hypertension, benign   . Mixed hyperlipidemia   . NASH (nonalcoholic steatohepatitis) 04/2007  . GERD (gastroesophageal reflux disease)   . Anxiety   . Glaucoma   . History of meniscal tear 2007  . Sleep apnea     sleep study performed - January 06, 2013   Prior to Admission medications   Medication Sig Start Date End Date Taking? Authorizing Provider  aspirin 81 MG tablet Take 81 mg by mouth once. Takes 3 qd   Yes Historical Provider, MD  cetirizine (ZYRTEC) 10 MG tablet Take 10 mg by mouth daily.   Yes Historical Provider, MD  enalapril (VASOTEC) 5 MG tablet TAKE 1 TABLET (5 MG TOTAL) BY MOUTH DAILY. 02/02/13  Yes Chelle S Jeffery, PA-C  fenofibrate (TRICOR) 145 MG tablet Take 1 tablet (145 mg total) by mouth daily. 04/30/12  Yes Chelle S  Jeffery, PA-C  fish oil-omega-3 fatty acids 1000 MG capsule Take 3 g by mouth daily.   Yes Historical Provider, MD  glipiZIDE (GLUCOTROL XL) 5 MG 24 hr tablet One tablet daily with largest meal of the day. 08/25/12  Yes Wardell Honour, MD  hydrochlorothiazide (HYDRODIURIL) 25 MG tablet Take 1 tablet (25 mg total) by mouth daily. 10/29/12  Yes Chelle S Jeffery, PA-C  ipratropium (ATROVENT) 0.03 % nasal spray Place 2 sprays into the nose 2 (two) times daily. 02/29/12 02/28/13 Yes Heather M Marte, PA-C  JANUVIA 100 MG tablet TAKE 1 TABLET EVERY DAY 11/30/12  Yes Heather M Marte, PA-C  losartan (COZAAR) 50 MG tablet Take 1 tablet (50 mg total) by mouth daily. 10/29/12  Yes Chelle S Jeffery, PA-C  metFORMIN (GLUCOPHAGE) 500 MG tablet Take 1 tablet (500 mg total) by mouth 2 (two) times daily with a meal. 10/29/12  Yes Chelle S Jeffery, PA-C  Multiple Vitamins-Minerals (MULTIVITAL PO) Take 1 tablet by mouth daily.   Yes Historical Provider, MD  NIASPAN 500 MG CR tablet TAKE ONE TABLET BY MOUTH ONE TIME DAILY 07/27/12  Yes Dionne Bucy McClung, PA-C  Omeprazole-Sodium Bicarbonate (ZEGERID PO) Take by mouth once.   Yes Historical Provider, MD  PARoxetine (PAXIL) 40 MG tablet TAKE 1 TABLET EVERY DAY 08/01/12  Yes Chelle S Jeffery, PA-C  pravastatin (PRAVACHOL) 40 MG tablet TAKE 1 TABLET (40 MG TOTAL) BY  MOUTH EVERY EVENING. 10/11/12  Yes Chelle Janalee Dane, PA-C    Allergies  Allergen Reactions  . Seasonal Ic (Cholestatin)    Family History  Problem Relation Age of Onset  . Hypertension Mother   . Diabetes Mother   . Kidney disease Father    History   Social History  . Marital Status: Married    Spouse Name: [name]    Number of Children: 0  . 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 Smoking status: Never Smoker   . Smokeless tobacco: Never Used  . Alcohol Use: No  . Drug Use: No  . Sexually Active: Yes -- Male partner(s)    Other Topics Concern  . Not on file   Social History Narrative   Lives with his wife.     Review of Systems See HPI above.      Objective:   Physical Exam   Filed Vitals:   02/04/13 0754  BP: 112/68  Pulse: 81  Temp: 98.7 F (37.1 C)  Resp: 16   General:  Overweight male sitting comfortably in no acute distress. Cardiovascular:  S1 and S2 are distinct with no murmurs, rubs or gallops. Respiratory:  CTA.  No adventitious sounds. PVS:  No carotid bruits present.  2+ pedal pulses bilaterally Skin:  No visible lesions of feet. Neuro:  Intact sensation to monofilament.  See Diabetic Foot Exam for more information.    Results for orders placed in visit on 02/04/13  GLUCOSE, POCT (MANUAL RESULT ENTRY)      Result Value Range   POC Glucose 147 (*) 70 - 99 mg/dl  POCT GLYCOSYLATED HEMOGLOBIN (HGB A1C)      Result Value Range   Hemoglobin A1C 7.2        Assessment & Plan:  Type II or unspecified type diabetes mellitus without mention of complication, uncontrolled - Plan: POCT glucose (manual entry), POCT glycosylated hemoglobin (Hb A1C), Comprehensive metabolic panel, glipiZIDE (GLUCOTROL XL) 5 MG 24 hr tablet  Essential hypertension, benign - Plan: enalapril (VASOTEC) 10 MG tablet, DISCONTINUED: enalapril (VASOTEC) 5 MG tablet, discontinue Losartan; unclear why patient was on both ACE and ARB  Mixed hyperlipidemia - Plan: Lipid panel, fenofibrate (TRICOR) 145 MG tablet, pravastatin (PRAVACHOL) 40 MG tablet  Microalbuminuria - Plan: Microalbumin, urine, Ambulatory referral to Nephrology  Continue patient education re: diabetes, hypertension, and hyperlipidemia.  Promote healthy lifestyle.  Schedule 3 mo follow-up.

## 2013-02-05 ENCOUNTER — Encounter: Payer: Self-pay | Admitting: Physician Assistant

## 2013-03-12 ENCOUNTER — Encounter: Payer: Self-pay | Admitting: Neurology

## 2013-03-12 ENCOUNTER — Ambulatory Visit (INDEPENDENT_AMBULATORY_CARE_PROVIDER_SITE_OTHER): Payer: Managed Care, Other (non HMO) | Admitting: Neurology

## 2013-03-12 VITALS — BP 124/81 | HR 88 | Resp 16 | Ht 68.0 in | Wt 220.0 lb

## 2013-03-12 DIAGNOSIS — G4733 Obstructive sleep apnea (adult) (pediatric): Secondary | ICD-10-CM

## 2013-03-12 DIAGNOSIS — G4730 Sleep apnea, unspecified: Secondary | ICD-10-CM

## 2013-03-12 NOTE — Patient Instructions (Signed)

## 2013-03-12 NOTE — Progress Notes (Signed)
Chief Complaint  Patient presents with  . Follow-up    6 weeks, rm 10   This patient underwent a HOME sleep test on 12-05-12 , documenting AHI 27 apneas per hour of sleep. The home sleep study also showed tachybradycardia arrhythmia a very variable heart rates between 40 beats per minute and 150 beats per minute  This would place the patient in the moderate sleep apnea category. It appears that all events were obstructive and not central apneas.  The patient has an elevated body mass index, a medical history of diabetes mellitus, and depression. He is also treated for mixed hyperlipidemia and anxiety-depression. The patient endorsed the Epworth sleepiness score at 13 point in March when he saw his primary care provider. Sleepiness score was endorsed at 5 points.  The patient is a shift worker, his work times  varied .  This in addition will contribute to his sleep disorder. He reports weight gain over the last 2 years and has elevated body mass index of the main risk factor for OSA. He endorses nocturia, loud snoring but not witnessed apneas. The patient reports that he pops in his sleep. Again his age as he confirmed the diagnosis of apnea on 12/05/2012. He used to drink caffeine in the morning to increase his alertness and wakefulness. The patient has an average bedtime at about midnight to 1 AM, but after he initiates sleep wakes promptly and often can only sleep one hour at a time. His sleep was highly fragmented as reported and was found to all related likely to apnea.  The patient reports that he is a supine sleeper.   The patient underwent an hour and titration to CPAP on 6-2 2014 at the time and endorsed  the Epworth score at 13 points. He was titrated to 10 cm water which opiate as tolerated but he only slept a total of 10 minutes at the setting. The AHI was still 4.3 the oxygen nadir however was 92% under the setting that was no REM sleep noted and all sleep was supine. I therefore ordered an  auto-titration between 8 and 12 cm Water , the patient will use this at home , we will follow an auto - titration 95% pressure. 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 He didn't like nasal pillows.   Today the patient brought his machine , documenting 75% compliance and residual AHI 3.2 . Average use time is  5 hrs 48 min. He used it 75% off all days in the last 2 month.  At the patient's machine does not have a humidifier, greatly surprised me.  He complains of a dry mouth and also of care leaks from a fullface mask. I think also sees can be addressed by his DME. This would also help him to achieve even higher compliance better. I had the autotitration between 8 and 12 cm water pressure, the machine is now set at 8 cm I will iallow for a varied  pressure by 7-10 cm to see if we can reduce the average, residual AHI from 3.2 any further.   Please not that the patient has the diagnosis of complex sleep apnea and that I have to operate with caution as not to trigger central apneas. He needs a humidifier.   Epworth 7 , FSS 30, and GDS 2 points,   General: The patient is awake, alert and appears not in acute distress. The patient is well  groomed. Head: Normocephalic, atraumatic. Neck is supple. No nasal deviation.  Cardiovascular:  Regular rate and rhythm, without  murmurs or carotid bruit, and without distended neck veins. Respiratory: Lungs are clear to auscultation. Skin:  Without evidence of edema, or rash Trunk: BMI is elevated and patient  has normal posture.   Neurologic exam : The patient is awake and alert, oriented to place and time.  Memory subjective described as intact. There is a normal attention span & concentration ability. Speech is fluent without dysarthria, dysphonia or aphasia. Mood and affect are appropriate. Cranial nerves: Pupils are equal and briskly reactive to light. Funduscopic exam without   evidence of pallor or edema. Extraocular movements  in vertical and horizontal planes intact and without nystagmus. Visual fields by finger perimetry are intact. Hearing to finger rub intact.  Facial sensation intact to fine touch. Facial motor strength is symmetric and tongue and uvula move midline.  Motor exam:   Normal tone and normal muscle bulk and symmetric normal strength in all extremities.  Sensory:  Fine touch, pinprick and vibration were tested in all extremities.   Coordination: Rapid alternating movements in the fingers/hands is  normal.   Gait and station: Patient walks without assistive device. Deep tendon reflexes: in the  upper and lower extremities are symmetric and intact. Babinski maneuver response is downgoing.   Assessment:  After physical and neurologic examination, review of laboratory studies, imaging, neurophysiology testing and pre-existing records, assessment will be reviewed on the problem list.   Allow for auto set window 7-10 cm water PAP and humidifier.  Compliance could be better.   Plan:  Treatment plan and additional workup will be reviewed under Problem List. Weight loss discussion and information. Exercise.

## 2013-03-13 NOTE — Progress Notes (Signed)
Pt brought over for mask fitting due to problems with leaking on his current ResMed Airfit FFM.  He says every time he puts it on, it is leaking at the top or the bottom, it is sized correctly but not sealing well.  Pt does wear some chin stubble.   Pt arrives at sleep lab for CPAP mask fitting and desensitization due to:   Marland Kitchen    CPAP Masks tried:  [employer] manager [employer] Paykel Simplus size medium  CPAP Masks preferred:  [employer] Simplus FFM size medium, pt said it already felt more comfortable than what he currently had.  Desensitization needs:  Patient was given a Risk manager [employer] Paykel Simplus FFM size medium to try at home for 1-14 nights to determine if it was something he could use all the time.  He is currently service by [employer] and he is rapidly approaching his 30 day point where he can exchange masks so if he can know right away, he will exchange for his own through them.  Otherwise, he may keep the sample mask as long as needed until his insurance can pay for another.  He was also given tubing because his own tubing did not fit the new mask.  Update:  Called patient back today 03/13/13 to see how he was doing.  He said the Simplus worked much better for him.  He is going to call [employer] to see about an exchange.  He said it sealed much better and was more comfortable.

## 2013-03-31 ENCOUNTER — Encounter: Payer: Self-pay | Admitting: Physician Assistant

## 2013-03-31 DIAGNOSIS — E118 Type 2 diabetes mellitus with unspecified complications: Secondary | ICD-10-CM

## 2013-04-04 ENCOUNTER — Ambulatory Visit (INDEPENDENT_AMBULATORY_CARE_PROVIDER_SITE_OTHER): Payer: Managed Care, Other (non HMO) | Admitting: Internal Medicine

## 2013-04-04 VITALS — BP 126/78 | HR 91 | Temp 98.8°F | Resp 18 | Ht 68.0 in | Wt 218.0 lb

## 2013-04-04 DIAGNOSIS — L723 Sebaceous cyst: Secondary | ICD-10-CM

## 2013-04-04 DIAGNOSIS — L720 Epidermal cyst: Secondary | ICD-10-CM

## 2013-04-04 MED ORDER — DOXYCYCLINE HYCLATE 100 MG PO TABS: 100.0000 mg | ORAL_TABLET | Freq: Two times a day (BID) | ORAL | Status: DC

## 2013-04-04 NOTE — Patient Instructions (Signed)
Epidermal Cyst An epidermal cyst is sometimes called a sebaceous cyst, epidermal inclusion cyst, or infundibular cyst. These cysts usually contain a substance that looks "pasty" or "cheesy" and may have a bad smell. This substance is a protein called keratin. Epidermal cysts are usually found on the face, neck, or trunk. They may also occur in the vaginal area or other parts of the genitalia of both men and women. Epidermal cysts are usually small, painless, slow-growing bumps or lumps that move freely under the skin. It is important not to try to pop them. This may cause an infection and lead to tenderness and swelling. CAUSES  Epidermal cysts may be caused by a deep penetrating injury to the skin or a plugged hair follicle, often associated with acne. SYMPTOMS  Epidermal cysts can become inflamed and cause:  Redness.  Tenderness.  Increased temperature of the skin over the bumps or lumps.  Grayish-white, bad smelling material that drains from the bump or lump. DIAGNOSIS  Epidermal cysts are easily diagnosed by your caregiver during an exam. Rarely, a tissue sample (biopsy) may be taken to rule out other conditions that may resemble epidermal cysts. TREATMENT   Epidermal cysts often get better and disappear on their own. They are rarely ever cancerous.  If a cyst becomes infected, it may become inflamed and tender. This may require opening and draining the cyst. Treatment with antibiotics may be necessary. When the infection is gone, the cyst may be removed with minor surgery.  Small, inflamed cysts can often be treated with antibiotics or by injecting steroid medicines.  Sometimes, epidermal cysts become large and bothersome. If this happens, surgical removal in your caregiver's office may be necessary. HOME CARE INSTRUCTIONS  Only take over-the-counter or prescription medicines as directed by your caregiver.  Take your antibiotics as directed. Finish them even if you start to feel  better. SEEK MEDICAL CARE IF:   Your cyst becomes tender, red, or swollen.  Your condition is not improving or is getting worse.  You have any other questions or concerns. MAKE SURE YOU:  Understand these instructions.  Will watch your condition.  Will get help right away if you are not doing well or get worse. Document Released: 06/24/2004 Document Revised: 10/16/2011 Document Reviewed: 01/30/2011 [facility] Patient Information 2014 [place], [place].

## 2013-04-04 NOTE — Progress Notes (Signed)
  Subjective:    Patient ID: Brad Pineda, male    DOB: 12-05-1975, 37 y.o.   MRN: [id number]  HPI Small cyst in right neck started getting bigger. Now 1.5 cm not tender but enlarging. Not red or warm   Review of Systems     Objective:   Physical Exam  Constitutional: He is oriented to person, place, and time. He appears well-developed.  HENT:  Head: Normocephalic.  Pulmonary/Chest: Effort normal.  Neurological: He is alert and oriented to person, place, and time. He exhibits normal muscle tone. Coordination normal.  Skin: Lesion and rash noted. Rash is nodular. Rash is not pustular. No erythema.     Cystic mass, mobile smooth  Psychiatric: He has a normal mood and affect.          Assessment & Plan:  Doxycycline/Warm compress Remove or ID if not gone 2-3 weeks

## 2013-05-09 ENCOUNTER — Encounter: Payer: Self-pay | Admitting: Neurology

## 2013-05-13 ENCOUNTER — Ambulatory Visit (INDEPENDENT_AMBULATORY_CARE_PROVIDER_SITE_OTHER): Payer: Managed Care, Other (non HMO) | Admitting: Physician Assistant

## 2013-05-13 ENCOUNTER — Encounter: Payer: Self-pay | Admitting: Physician Assistant

## 2013-05-13 ENCOUNTER — Telehealth: Payer: Self-pay

## 2013-05-13 VITALS — BP 134/88 | HR 91 | Temp 99.6°F | Resp 16 | Ht 68.5 in | Wt 213.0 lb

## 2013-05-13 DIAGNOSIS — E782 Mixed hyperlipidemia: Secondary | ICD-10-CM

## 2013-05-13 DIAGNOSIS — L720 Epidermal cyst: Secondary | ICD-10-CM

## 2013-05-13 DIAGNOSIS — Z6830 Body mass index (BMI) 30.0-30.9, adult: Secondary | ICD-10-CM | POA: Insufficient documentation

## 2013-05-13 DIAGNOSIS — R809 Proteinuria, unspecified: Secondary | ICD-10-CM

## 2013-05-13 DIAGNOSIS — F411 Generalized anxiety disorder: Secondary | ICD-10-CM

## 2013-05-13 DIAGNOSIS — G4733 Obstructive sleep apnea (adult) (pediatric): Secondary | ICD-10-CM

## 2013-05-13 DIAGNOSIS — I10 Essential (primary) hypertension: Secondary | ICD-10-CM

## 2013-05-13 DIAGNOSIS — F419 Anxiety disorder, unspecified: Secondary | ICD-10-CM

## 2013-05-13 DIAGNOSIS — E119 Type 2 diabetes mellitus without complications: Secondary | ICD-10-CM

## 2013-05-13 DIAGNOSIS — L723 Sebaceous cyst: Secondary | ICD-10-CM

## 2013-05-13 LAB — LIPID PANEL
Cholesterol: 201 mg/dL — ABNORMAL HIGH (ref 0–200)
HDL: 22 mg/dL — ABNORMAL LOW (ref >39)
Total CHOL/HDL Ratio: 9.1 Ratio
Triglycerides: 583 mg/dL — ABNORMAL HIGH (ref <150)

## 2013-05-13 LAB — COMPREHENSIVE METABOLIC PANEL
BUN: 16 mg/dL (ref 6–23)
CO2: 31 mEq/L (ref 19–32)
Calcium: 10.1 mg/dL (ref 8.4–10.5)
Chloride: 98 mEq/L (ref 96–112)
Creat: 0.99 mg/dL (ref 0.50–1.35)
Glucose, Bld: 287 mg/dL — ABNORMAL HIGH (ref 70–99)

## 2013-05-13 MED ORDER — NIACIN ER (ANTIHYPERLIPIDEMIC) 500 MG PO TBCR: EXTENDED_RELEASE_TABLET | ORAL | Status: DC

## 2013-05-13 MED ORDER — SAXAGLIPTIN HCL 2.5 MG PO TABS: 2.5000 mg | ORAL_TABLET | Freq: Every day | ORAL | Status: DC

## 2013-05-13 MED ORDER — PRAVASTATIN SODIUM 40 MG PO TABS: ORAL_TABLET | ORAL | Status: DC

## 2013-05-13 MED ORDER — ENALAPRIL MALEATE 10 MG PO TABS: 10.0000 mg | ORAL_TABLET | Freq: Every day | ORAL | Status: DC

## 2013-05-13 MED ORDER — PAROXETINE HCL 40 MG PO TABS: ORAL_TABLET | ORAL | Status: DC

## 2013-05-13 NOTE — Progress Notes (Signed)
Subjective:    Patient ID: Brad Pineda, male    DOB: [birth year], 37 y.o.   MRN: [id number]  HPI Brad Pineda is a 37 year old male who is here today for follow up on his DM, HTN, and hyperlipidemia. He has been off of his pravastatin, Niaspan, Januvia, and Enalapril for several weeks. The Januvia is too expensive so he stopped taking it. His diet and exercise are unchanged since his last visit. He believes he is sleeping better with his CPAP but no change in his energy level or fatigue.   He denies chest pain/tigthness, palpitations, SOB, edema, or swelling. He does note increased headaches but no dizziness, vision changes, or loss of sensation in extremities.   Cyst has not resolved, even though he finished the whole doxy prescription. Cyst is still bothering him. Using heating pad which didn't seem to help. Just bothers him that it is there and wants it removed. No change in appetite or weight. No night sweats, fever, chills, or myalgias.   nReview of Systems    as above Objective:   Physical Exam  Constitutional: He is oriented to person, place, and time. He appears well-developed and well-nourished.  Obese  Eyes: Conjunctivae are normal.  Fundoscopic exam:      The right eye shows no arteriolar narrowing, no AV nicking, no exudate, no hemorrhage and no papilledema. The right eye shows red reflex.       The left eye shows no arteriolar narrowing, no AV nicking, no exudate, no hemorrhage and no papilledema. The left eye shows red reflex.  Neck: Trachea normal and full passive range of motion without pain. Carotid bruit is not present. No edema present. No mass present.  Small 1 cm cyst on right medial neck, near carotid artery. Cyst is immobile, nontender. No area of errythema, rash, or swelling noted over the area  Cardiovascular: Normal rate, regular rhythm, S1 normal, S2 normal and intact distal pulses.   Pulmonary/Chest: Effort normal and breath sounds normal.  Lymphadenopathy:     He has no cervical adenopathy.  Neurological: He is alert and oriented to person, place, and time.  See Diabetic foot exam      BP 134/88  Pulse 91  Temp(Src) 99.6 F (37.6 C) (Oral)  Resp 16  Ht 5' 8.5" (1.74 m)  Wt 213 lb (96.616 kg)  BMI 31.91 kg/m2  SpO2 96%  Recent Results (from the past 2160 hour(s))  GLUCOSE, POCT (MANUAL RESULT ENTRY)     Status: Abnormal   Collection Time    05/13/13 10:08 AM      Result Value Range   POC Glucose 280 (*) 70 - 99 mg/dl  POCT GLYCOSYLATED HEMOGLOBIN (HGB A1C)     Status: None   Collection Time    05/13/13 10:08 AM      Result Value Range   Hemoglobin A1C 8.8         Assessment & Plan:  Type II or unspecified type diabetes mellitus without mention of complication, not controlled - Plan: HM Diabetes Foot Exam, POCT glucose (manual entry), POCT glycosylated hemoglobin (Hb A1C), Comprehensive metabolic panel, saxagliptin HCl (ONGLYZA) 2.5 MG TABS tablet   Microalbuminuria - evaluation by nephrology since last visit.  Continue ACEI.  Epidermal cyst of neck - schedule excision at his convenience.    Essential hypertension, benign - stable. Plan: enalapril (VASOTEC) 10 MG tablet  Mixed hyperlipidemia - Plan: Lipid panel, niacin (NIASPAN) 500 MG CR tablet, pravastatin (PRAVACHOL) 40 MG tablet  Obstructive sleep apnea - continue CPAP  Anxiety - stable. Plan: PARoxetine (PAXIL) 40 MG tablet  Continue meds and start saxagliptin. Please call if any of the meds are too expensive so we can try another option. Work on diet and exercise. F/U in 3 months for recheck on DM.

## 2013-05-13 NOTE — Telephone Encounter (Signed)
Saw Brad Pineda this morning. Decided to continue taking the Januvia instead of the other rx because it is too expensive. Call at [phone].

## 2013-05-13 NOTE — Patient Instructions (Addendum)
Restart medications, as prescribed. If something is too expensive, please contact me so that I can change them to something else. It's very important that you stay on the medications. Please also work hard on making healthy eating choices and getting regular exercise.

## 2013-05-13 NOTE — Progress Notes (Signed)
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  The cyst is small, and non-tender.  There is no erythema.  It does not get irritated or caught when he shaves.  However, he would feel better if it were not there.  He'll schedule excision at his convenience.  Diabetes is not controlled, and continues to worsen. Discussed the importance of getting and maintaining glucose control.  If the onglyza is too expensive, he'll contact me. We can increase the metformin to 1000 mg BID if he can tolerate it, &/or increase the glipizide XL from 5 mg to 10 mg daily, though my preference is to eliminate the sulfonylurea at some point.  He will get his flu vaccine at work.

## 2013-05-13 NOTE — Telephone Encounter (Signed)
Saw Chelle this morning. Decided to continue taking the

## 2013-05-14 ENCOUNTER — Encounter: Payer: Self-pay | Admitting: Physician Assistant

## 2013-06-10 ENCOUNTER — Encounter: Payer: Self-pay | Admitting: Physician Assistant

## 2013-06-10 ENCOUNTER — Ambulatory Visit (INDEPENDENT_AMBULATORY_CARE_PROVIDER_SITE_OTHER): Payer: Managed Care, Other (non HMO) | Admitting: Physician Assistant

## 2013-06-10 VITALS — BP 136/76 | HR 85 | Temp 99.0°F | Resp 16 | Ht 68.5 in | Wt 216.0 lb

## 2013-06-10 DIAGNOSIS — L720 Epidermal cyst: Secondary | ICD-10-CM

## 2013-06-10 DIAGNOSIS — L723 Sebaceous cyst: Secondary | ICD-10-CM

## 2013-06-10 NOTE — Patient Instructions (Signed)
WOUND CARE Please return in 7-10 days to have your stitches/staples removed or sooner if you have concerns. Marland Kitchen Keep area clean and dry for 24 hours. Do not remove bandage, if applied. . After 24 hours, remove bandage and wash wound gently with mild soap and warm water. Reapply a new bandage after cleaning wound, if directed. . Continue daily cleansing with soap and water until stitches/staples are removed. . Do not apply any ointments or creams to the wound while stitches/staples are in place, as this may cause delayed healing. . Notify the office if you experience any of the following signs of infection: Swelling, redness, pus drainage, streaking, fever >101.0 F . Notify the office if you experience excessive bleeding that does not stop after 15-20 minutes of constant, firm pressure.

## 2013-06-10 NOTE — Progress Notes (Signed)
SUBJECTIVE: This 37 y.o. male presents for excision of an epidermal cyst from the RIGHT anterior neck.  OBJECTIVE: BP 136/76  Pulse 85  Temp(Src) 99 F (37.2 C) (Oral)  Resp 16  Ht 5' 8.5" (1.74 m)  Wt 216 lb (97.977 kg)  BMI 32.36 kg/m2  SpO2 97%  WDWNWM, A&O x 3. 4 mm non-tender lump on the RIGHT anterior neck, consistent with previously evaluated sebaceous cyst.  PROCEDURE: Verbal consent obtained. Local anesthesia with 3 cc 2% lidocaine plain. Sterile prep and drape. Elliptical excision including central dilated pore will 15 blade. Cyst dissected from surrounding tissue, and ruptured during procedure revealing sebaceous material without purulence. Wound closed with #1 horizontal mattress and #1 simple interrupted 4-0 Ethilon suture. Area cleansed and dressed. The patient tolerated the procedure well. Local wound care reviewed.  ASSESSMENT & PLAN: Epidermal cyst of neck  Local wound care.  RTC 7-10 days for suture removal.  Fara Chute, PA-C Physician Assistant-Certified [facility]

## 2013-06-19 ENCOUNTER — Ambulatory Visit (INDEPENDENT_AMBULATORY_CARE_PROVIDER_SITE_OTHER): Payer: Managed Care, Other (non HMO) | Admitting: Physician Assistant

## 2013-06-19 VITALS — BP 122/76 | HR 113 | Temp 98.1°F | Resp 16 | Ht 68.0 in | Wt 215.0 lb

## 2013-06-19 DIAGNOSIS — L720 Epidermal cyst: Secondary | ICD-10-CM

## 2013-06-19 DIAGNOSIS — L723 Sebaceous cyst: Secondary | ICD-10-CM

## 2013-06-19 NOTE — Progress Notes (Signed)
Brad Pineda presents for suture removal.  Last week he was here for excision of a sebaceous cyst on the RIGHT anterior neck.  He's done well, without problems or concerns.  O: BP 122/76  Pulse 113  Temp(Src) 98.1 F (36.7 C) (Oral)  Resp 16  Ht 5' 8"  (1.727 m)  Wt 215 lb (97.523 kg)  BMI 32.70 kg/m2  SpO2 96% WDWNWM, A&O x 3. Well healed surgical wound.  #2 sutures intact.  Removed without incident.  A: Sebaceous cyst, RIGHT anterior neck, S/P excision  P: Local wound care.  Anticipatory guidance.  RTC as previously planned for follow-up of chronic medical problems.

## 2013-08-04 IMAGING — CR DG CHEST 2V
2 series · 2 of 2 positions shown · non-contrast
Comparison: None.

CLINICAL DATA: Cough and shortness of breath.

CHEST - 2 VIEW

[PA]
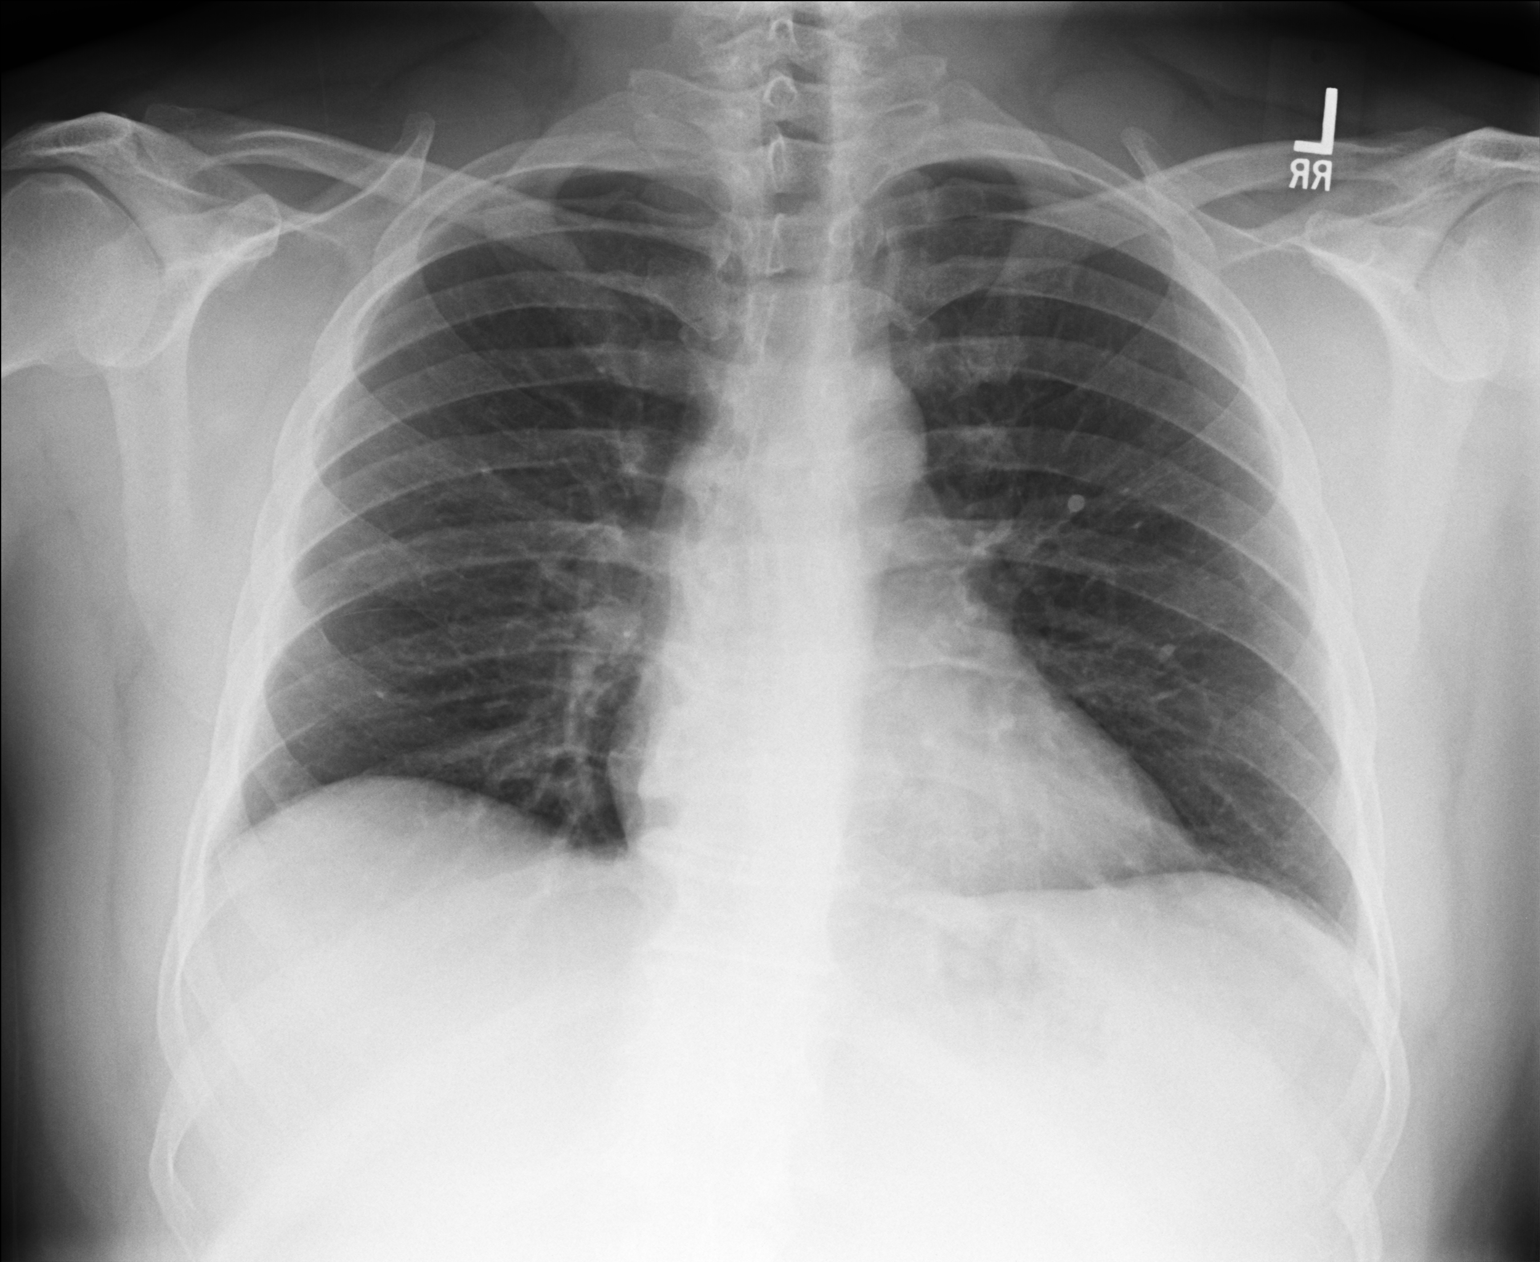

[lateral]
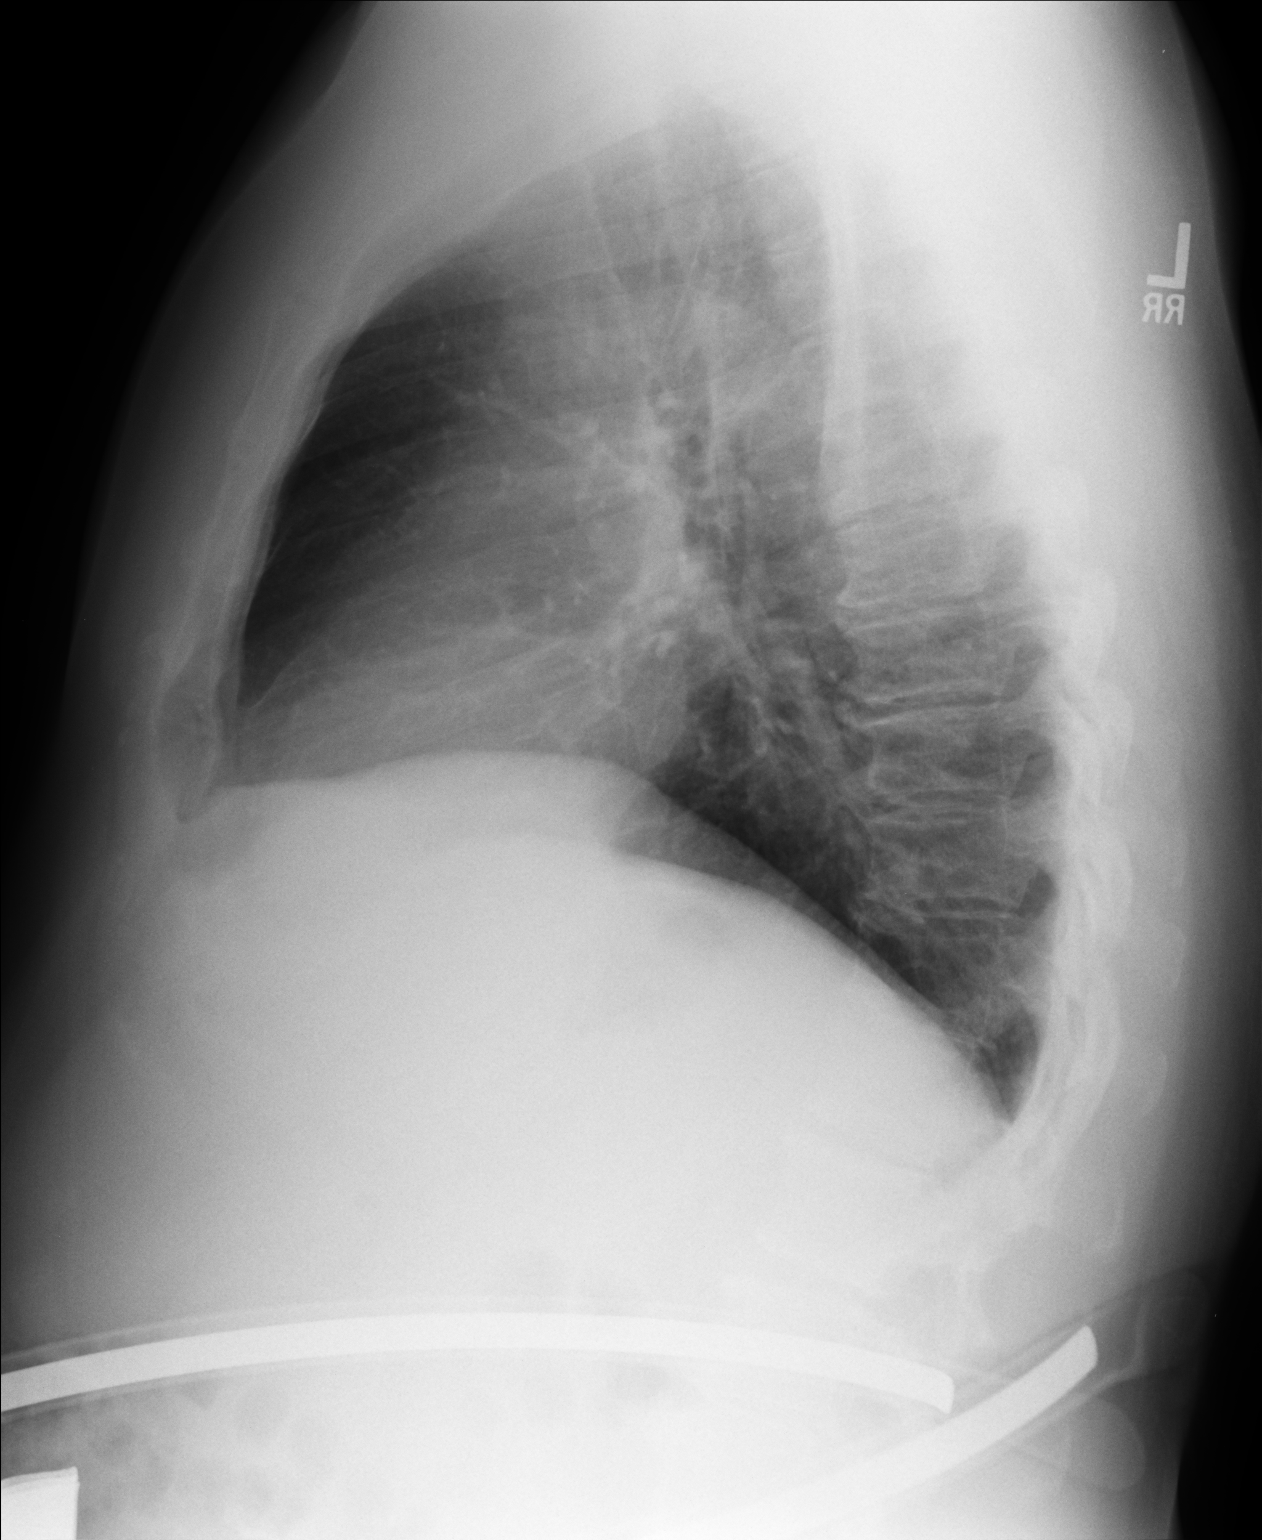

[2 of 2 positions shown; findings below may reference images not displayed]

FINDINGS: The heart size is normal.  The lungs are clear.  The
visualized soft tissues and bony thorax are unremarkable.
IMPRESSION: Negative chest.

Clinically significant discrepancy from primary report, if
provided: None

## 2013-08-19 ENCOUNTER — Ambulatory Visit (INDEPENDENT_AMBULATORY_CARE_PROVIDER_SITE_OTHER): Payer: Managed Care, Other (non HMO) | Admitting: Physician Assistant

## 2013-08-19 ENCOUNTER — Encounter: Payer: Self-pay | Admitting: Physician Assistant

## 2013-08-19 VITALS — BP 126/82 | HR 101 | Temp 97.8°F | Resp 18 | Ht 68.5 in | Wt 204.0 lb

## 2013-08-19 DIAGNOSIS — J309 Allergic rhinitis, unspecified: Secondary | ICD-10-CM

## 2013-08-19 DIAGNOSIS — L738 Other specified follicular disorders: Secondary | ICD-10-CM

## 2013-08-19 DIAGNOSIS — E782 Mixed hyperlipidemia: Secondary | ICD-10-CM

## 2013-08-19 DIAGNOSIS — E669 Obesity, unspecified: Secondary | ICD-10-CM

## 2013-08-19 DIAGNOSIS — E1165 Type 2 diabetes mellitus with hyperglycemia: Secondary | ICD-10-CM

## 2013-08-19 DIAGNOSIS — E108 Type 1 diabetes mellitus with unspecified complications: Secondary | ICD-10-CM

## 2013-08-19 DIAGNOSIS — L853 Xerosis cutis: Secondary | ICD-10-CM

## 2013-08-19 DIAGNOSIS — IMO0002 Reserved for concepts with insufficient information to code with codable children: Secondary | ICD-10-CM

## 2013-08-19 DIAGNOSIS — E1065 Type 1 diabetes mellitus with hyperglycemia: Secondary | ICD-10-CM

## 2013-08-19 DIAGNOSIS — E118 Type 2 diabetes mellitus with unspecified complications: Principal | ICD-10-CM

## 2013-08-19 DIAGNOSIS — I10 Essential (primary) hypertension: Secondary | ICD-10-CM

## 2013-08-19 LAB — COMPLETE METABOLIC PANEL WITH GFR
ALT: 72 U/L — AB (ref 0–53)
AST: 43 U/L — ABNORMAL HIGH (ref 0–37)
Albumin: 4.5 g/dL (ref 3.5–5.2)
Alkaline Phosphatase: 71 U/L (ref 39–117)
BUN: 16 mg/dL (ref 6–23)
CALCIUM: 9.5 mg/dL (ref 8.4–10.5)
CHLORIDE: 95 meq/L — AB (ref 96–112)
CO2: 26 meq/L (ref 19–32)
CREATININE: 0.93 mg/dL (ref 0.50–1.35)
GFR, Est Non African American: >89 mL/min
Glucose, Bld: 283 mg/dL — ABNORMAL HIGH (ref 70–99)
Potassium: 3.7 mEq/L (ref 3.5–5.3)
Sodium: 132 mEq/L — ABNORMAL LOW (ref 135–145)
Total Bilirubin: 0.7 mg/dL (ref 0.3–1.2)
Total Protein: 6.7 g/dL (ref 6.0–8.3)

## 2013-08-19 LAB — POCT GLYCOSYLATED HEMOGLOBIN (HGB A1C): Hemoglobin A1C: 10.3

## 2013-08-19 LAB — LIPID PANEL
CHOLESTEROL: 120 mg/dL (ref 0–200)
HDL: 22 mg/dL — ABNORMAL LOW (ref >39)
LDL CALC: 60 mg/dL (ref 0–99)
Total CHOL/HDL Ratio: 5.5 Ratio
Triglycerides: 191 mg/dL — ABNORMAL HIGH (ref <150)
VLDL: 38 mg/dL (ref 0–40)

## 2013-08-19 LAB — GLUCOSE, POCT (MANUAL RESULT ENTRY): POC Glucose: 318 mg/dl — AB (ref 70–99)

## 2013-08-19 MED ORDER — AMMONIUM LACTATE 12 % EX LOTN: 1.0000 "application " | TOPICAL_LOTION | Freq: Two times a day (BID) | CUTANEOUS | Status: DC

## 2013-08-19 MED ORDER — SITAGLIPTIN PHOSPHATE 100 MG PO TABS: 100.0000 mg | ORAL_TABLET | Freq: Every day | ORAL | Status: DC

## 2013-08-19 MED ORDER — IPRATROPIUM BROMIDE 0.03 % NA SOLN: 2.0000 | Freq: Two times a day (BID) | NASAL | Status: DC

## 2013-08-19 NOTE — Patient Instructions (Signed)
It's really important that you take your medications regularly. It's also important that you get regular exercise and make healthy eating choices. Drink plenty of water (64 ounces daily). Use the ointment and gloves on your hands every night. After washing your hands, dry them completely.  Air-drying can make them more dry. Apply hand lotion several times each day. Wear gloves at work.

## 2013-08-19 NOTE — Progress Notes (Signed)
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 Stressed the importance of healthy eating choices, regular exercise and taking his medications daily.  Encouraged him to let me know if he cannot afford something, so that I can develop an alternate plan.

## 2013-08-19 NOTE — Progress Notes (Signed)
   Subjective:    Patient ID: Brad Pineda, male    DOB: 03/17/1976, 38 y.o.   MRN: [id number]  Reviewed patients allergies, medications, past medical history, family history and social history.  HPI  Brad Pineda is a 38 year old male who is here today for follow up on his DM, HTN and hyperlipidemia.  He is doing well with Metformin and taking as prescribed. Was not able to take Januvia for 1.5 months because of cost. Started back on Januvia 08/08/2013. Hasn't been feeling as hungry and feels he has lost weight. Has not been exercising but walks frequently work.  Denies loss of sensation in extremities or changes in vision. Notes feeling of lightheadedness about once a week when standing. Will go away upon standing still for a few minutes. Reports more headaches than normal. Takes ibuprofen that seems to help.   Still using the CPAP machine but doesn't think it is helping. Wakes up every few hours.   His hands have been very dry and are cracking. Also reports cutting them at work because not using gloves.   Review of Systems As above.    Objective:   Physical Exam  Vitals reviewed. Constitutional: He is oriented to person, place, and time. He appears well-developed and well-nourished.  Eyes:  Fundoscopic exam:      The right eye shows no AV nicking, no exudate and no hemorrhage. The right eye shows red reflex.       The left eye shows no AV nicking, no exudate and no hemorrhage. The left eye shows red reflex.  Cardiovascular: Normal rate, regular rhythm, normal heart sounds and intact distal pulses.   Pulmonary/Chest: Effort normal and breath sounds normal.  Neurological: He is alert and oriented to person, place, and time.  Skin:     Psychiatric: He has a normal mood and affect. His behavior is normal. Thought content normal.   See diabetic foot exam - normal   Results for orders placed in visit on 08/19/13  POCT GLYCOSYLATED HEMOGLOBIN (HGB A1C)      Result Value Range     Hemoglobin A1C 10.3    GLUCOSE, POCT (MANUAL RESULT ENTRY)      Result Value Range   POC Glucose 318 (*) 70 - 99 mg/dl         Assessment & Plan:   1. Diabetes mellitus type 2, uncontrolled, with complications Continue to take Metformin and Januvia as prescribed. Expect improvement in glucose when regularly taking both medications. Stressed importance of exercise and healthy diet.  - HM Diabetes Foot Exam - POCT glycosylated hemoglobin (Hb A1C) - POCT glucose (manual entry) - COMPLETE METABOLIC PANEL WITH GFR - sitaGLIPtin (JANUVIA) 100 MG tablet; Take 1 tablet (100 mg total) by mouth daily.  Dispense: 90 tablet; Refill: 3  2. Essential hypertension, benign Continue taking Enalapril and HCTZ as prescribed.  3. Mixed hyperlipidemia Continue Pravastatin as prescribed.  - Lipid panel  4. Obesity (BMI 30-39.9) Encouraged regular exercise and healthy eating choices.   5. Allergic rhinitis - ipratropium (ATROVENT) 0.03 % nasal spray; Place 2 sprays into the nose 2 (two) times daily.  Dispense: 30 mL; Refill: 5  6. Dry skin Use Laclotion twice daily and completely dry hands after washing. Wear gloves at work.  - ammonium lactate (LACLOTION) 12 % lotion; Apply 1 application topically 2 (two) times daily.  Dispense: 400 g; Refill: 5

## 2013-08-22 ENCOUNTER — Encounter: Payer: Self-pay | Admitting: Physician Assistant

## 2013-09-17 ENCOUNTER — Ambulatory Visit: Payer: Managed Care, Other (non HMO) | Admitting: Neurology

## 2013-11-17 ENCOUNTER — Other Ambulatory Visit: Payer: Self-pay | Admitting: Physician Assistant

## 2013-11-25 ENCOUNTER — Encounter: Payer: Self-pay | Admitting: Physician Assistant

## 2013-11-25 ENCOUNTER — Ambulatory Visit (INDEPENDENT_AMBULATORY_CARE_PROVIDER_SITE_OTHER): Payer: Managed Care, Other (non HMO) | Admitting: Physician Assistant

## 2013-11-25 VITALS — BP 120/80 | HR 101 | Temp 98.4°F | Resp 18 | Ht 68.0 in | Wt 210.0 lb

## 2013-11-25 DIAGNOSIS — E782 Mixed hyperlipidemia: Secondary | ICD-10-CM

## 2013-11-25 DIAGNOSIS — F411 Generalized anxiety disorder: Secondary | ICD-10-CM

## 2013-11-25 DIAGNOSIS — E669 Obesity, unspecified: Secondary | ICD-10-CM

## 2013-11-25 DIAGNOSIS — E1165 Type 2 diabetes mellitus with hyperglycemia: Secondary | ICD-10-CM

## 2013-11-25 DIAGNOSIS — IMO0002 Reserved for concepts with insufficient information to code with codable children: Secondary | ICD-10-CM

## 2013-11-25 DIAGNOSIS — F419 Anxiety disorder, unspecified: Secondary | ICD-10-CM

## 2013-11-25 DIAGNOSIS — E118 Type 2 diabetes mellitus with unspecified complications: Principal | ICD-10-CM

## 2013-11-25 DIAGNOSIS — I10 Essential (primary) hypertension: Secondary | ICD-10-CM

## 2013-11-25 LAB — POCT GLYCOSYLATED HEMOGLOBIN (HGB A1C): Hemoglobin A1C: 8.1

## 2013-11-25 LAB — COMPLETE METABOLIC PANEL WITH GFR
ALK PHOS: 78 U/L (ref 39–117)
ALT: 53 U/L (ref 0–53)
AST: 22 U/L (ref 0–37)
Albumin: 4.7 g/dL (ref 3.5–5.2)
BUN: 14 mg/dL (ref 6–23)
CO2: 30 mEq/L (ref 19–32)
Calcium: 9.4 mg/dL (ref 8.4–10.5)
Chloride: 97 mEq/L (ref 96–112)
Creat: 1.22 mg/dL (ref 0.50–1.35)
GFR, EST NON AFRICAN AMERICAN: 74 mL/min
GFR, Est African American: 86 mL/min
Glucose, Bld: 359 mg/dL — ABNORMAL HIGH (ref 70–99)
POTASSIUM: 4.4 meq/L (ref 3.5–5.3)
SODIUM: 134 meq/L — AB (ref 135–145)
TOTAL PROTEIN: 7 g/dL (ref 6.0–8.3)
Total Bilirubin: 0.5 mg/dL (ref 0.2–1.2)

## 2013-11-25 LAB — LIPID PANEL
CHOL/HDL RATIO: 5.5 ratio
Cholesterol: 133 mg/dL (ref 0–200)
HDL: 24 mg/dL — ABNORMAL LOW (ref >39)
LDL CALC: 54 mg/dL (ref 0–99)
Triglycerides: 274 mg/dL — ABNORMAL HIGH (ref <150)
VLDL: 55 mg/dL — AB (ref 0–40)

## 2013-11-25 LAB — GLUCOSE, POCT (MANUAL RESULT ENTRY): POC Glucose: 401 mg/dl — AB (ref 70–99)

## 2013-11-25 MED ORDER — LOSARTAN POTASSIUM 50 MG PO TABS: ORAL_TABLET | ORAL | Status: DC

## 2013-11-25 MED ORDER — METFORMIN HCL 500 MG PO TABS: 500.0000 mg | ORAL_TABLET | Freq: Two times a day (BID) | ORAL | Status: DC

## 2013-11-25 MED ORDER — HYDROCHLOROTHIAZIDE 25 MG PO TABS: ORAL_TABLET | ORAL | Status: DC

## 2013-11-25 MED ORDER — PAROXETINE HCL 40 MG PO TABS: ORAL_TABLET | ORAL | Status: DC

## 2013-11-25 MED ORDER — NIACIN ER (ANTIHYPERLIPIDEMIC) 500 MG PO TBCR: EXTENDED_RELEASE_TABLET | ORAL | Status: DC

## 2013-11-25 NOTE — Progress Notes (Signed)
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  Return in about 3 months (around 02/24/2014) for re-evaluation.

## 2013-11-25 NOTE — Progress Notes (Signed)
Subjective:    Patient ID: Brad Pineda, male    DOB: 14-Sep-1975, 38 y.o.   MRN: [id number]  Diabetes   37y.o male pt with hx of Type II DM, HTN, hyperlipidemia, anxiety presents to clinic for refills of metformin, HCTZ, losartan, paxil, and niacin.  Pt also in clinic today to follow up for diabetes.  Pt states he never checks his blood sugar, does not alter diet to control sugar, however he does take medication regularly.  Pt admits to being too lazy to modify lifestyle.  Denies increase thirst or urinary frequency.  States he does not see an eye doctor.  Pt has been staying active with new dog as of 06/2013.      Review of Systems  Constitutional: Negative.   HENT: Negative.   Respiratory: Negative.   Cardiovascular: Negative.   Gastrointestinal: Negative.   Endocrine: Negative.   Genitourinary: Negative.   Musculoskeletal: Negative.   Skin: Negative for color change and rash.  Allergic/Immunologic: Positive for environmental allergies.  Neurological: Negative.   Hematological: Negative.        Objective:   Physical Exam  Constitutional: He is oriented to person, place, and time. He appears well-developed and well-nourished. No distress.  BP 120/80  Pulse 101  Temp(Src) 98.4 F (36.9 C) (Oral)  Resp 18  Ht 5' 8"  (1.727 m)  Wt 210 lb (95.255 kg)  BMI 31.94 kg/m2  SpO2 97%    HENT:  Head: Normocephalic.  Eyes: Conjunctivae are normal. Pupils are equal, round, and reactive to light.  Cardiovascular: Normal rate, regular rhythm, normal heart sounds and intact distal pulses.  Exam reveals no gallop and no friction rub.   No murmur heard. Pulmonary/Chest: Effort normal and breath sounds normal. No respiratory distress. He has no wheezes.  Neurological: He is alert and oriented to person, place, and time.  Skin: Skin is warm and dry. No rash noted.  Psychiatric: He has a normal mood and affect. His behavior is normal.    Diabetic foot exam: normal  Results for  orders placed in visit on 11/25/13  GLUCOSE, POCT (MANUAL RESULT ENTRY)      Result Value Ref Range   POC Glucose 401 (*) 70 - 99 mg/dl  POCT GLYCOSYLATED HEMOGLOBIN (HGB A1C)      Result Value Ref Range   Hemoglobin A1C 8.1         Assessment & Plan:   1. Type II or unspecified type diabetes mellitus with unspecified complication, uncontrolled 5. Obesity (BMI 30-39.9) Glucose over 400 in office however A1C improved from 10.3 to 8.1.  Discussed importance to monitor glucose.  Pt agreed with being referred for diabetic education with his wife.  Will follow up in 3 months. - POCT glucose (manual entry) - POCT glycosylated hemoglobin (Hb A1C) - COMPLETE METABOLIC PANEL WITH GFR - HM Diabetes Foot Exam - metFORMIN (GLUCOPHAGE) 500 MG tablet; Take 1 tablet (500 mg total) by mouth 2 (two) times daily with a meal.  Dispense: 180 tablet; Refill: 3 - Ambulatory referral to diabetic education  2. Mixed hyperlipidemia Continue current treatment plan - COMPLETE METABOLIC PANEL WITH GFR - Lipid panel - niacin (NIASPAN) 500 MG CR tablet; TAKE ONE TABLET BY MOUTH ONE TIME DAILY  Dispense: 90 tablet; Refill: 2  3. Anxiety Continue current treatment plan - PARoxetine (PAXIL) 40 MG tablet; TAKE 1 TABLET EVERY DAY  Dispense: 90 tablet; Refill: 2  4. Essential hypertension, benign Continue current treatment plan. - losartan (COZAAR) 50 MG  tablet; TAKE 1 TABLET (50 MG TOTAL) BY MOUTH DAILY.  Dispense: 90 tablet; Refill: 3 - hydrochlorothiazide (HYDRODIURIL) 25 MG tablet; TAKE 1 TABLET (25 MG TOTAL) BY MOUTH DAILY.  Dispense: 90 tablet; Refill: 3

## 2013-11-25 NOTE — Patient Instructions (Addendum)
You need to schedule an appointment with an eye specialist. It's important that you make healthy eating choices. I've referred you to a Diabetes Educator to learn some ways to make some healthy changes.  I will contact you with your lab results as soon as they are available.   If you have not heard from me in 2 weeks, please contact me.  The fastest way to get your results is to register for My Chart (see the instructions on the last page of this printout).

## 2013-11-27 ENCOUNTER — Encounter: Payer: Self-pay | Admitting: Physician Assistant

## 2013-12-10 ENCOUNTER — Ambulatory Visit (INDEPENDENT_AMBULATORY_CARE_PROVIDER_SITE_OTHER): Payer: Managed Care, Other (non HMO) | Admitting: Neurology

## 2013-12-10 ENCOUNTER — Encounter: Payer: Self-pay | Admitting: Neurology

## 2013-12-10 VITALS — BP 119/73 | HR 96 | Resp 18 | Ht 68.25 in | Wt 208.0 lb

## 2013-12-10 DIAGNOSIS — G4726 Circadian rhythm sleep disorder, shift work type: Secondary | ICD-10-CM

## 2013-12-10 DIAGNOSIS — G4733 Obstructive sleep apnea (adult) (pediatric): Secondary | ICD-10-CM | POA: Insufficient documentation

## 2013-12-10 DIAGNOSIS — G4710 Hypersomnia, unspecified: Secondary | ICD-10-CM

## 2013-12-10 DIAGNOSIS — Z9989 Dependence on other enabling machines and devices: Principal | ICD-10-CM

## 2013-12-10 MED ORDER — NUVIGIL 250 MG PO TABS: 250.0000 mg | ORAL_TABLET | Freq: Every day | ORAL | Status: DC

## 2013-12-10 NOTE — Progress Notes (Signed)
Chief Complaint  Patient presents with  . Follow-up    Room 11  . Sleep Apnea   Patient seen on 5-6 -15 for CPAP compliance, patient of [place]unch, [place]  .  He has used CPAP after titration, and after a HST confirmed OSA and was supposed to be sen for a compliance visit. He forgot his machine and chip card and stated he was not reminded to bring them.  Brad Pineda confirms today of fatigue severity score of 16 points, and an Epworth sleepiness score of 9 points. This is much better than initially prior to titration, however he doesn't feel that CPAP he was has provided him with more energy subjectively. He wakes some mornings feeling refreshed, he naps rarely. He does this after a very early shifts beginning  at 6 AM - 4 Pm.  He has full facial hair . We discussed using Nuvigil in daytime for OSA with hypersomnia. He  has to bring his memory chip to the office, auto titrator.  CD      Past history ( CD) :This patient underwent a HOME sleep test on 12-05-12 , documenting AHI 27 apneas per hour of sleep. The home sleep study also showed tachybradycardia arrhythmia.  a very variable heart rate between 40 beats per minute and 150 beats per minute  This would place the patient in the moderate sleep apnea category. It appears that all events were obstructive and not central apneas.  The patient has an elevated body mass index, a medical history of diabetes mellitus, and depression. He is also treated for mixed hyperlipidemia and anxiety-depression. The patient endorsed the Epworth sleepiness score at 13 point in March when he saw his primary care provider. Sleepiness score was endorsed at 5 points.  The patient is a shift worker, his work times  varied .   The patient underwent an hour and titration to CPAP on 6-2 2014. At the time  endorsed \ the Epworth score at 13 points. He was titrated to 10 cm water which opiate as tolerated but he only slept a total of 10 minutes at the setting. The AHI was  still 4.3 the oxygen nadir however was 92% under the setting that was no REM sleep noted and all sleep was supine. I therefore ordered an auto-titration between 8 and 12 cm Water , the patient will use this at home , we will follow an auto - titration 95% pressure. During the full night titration a full face mask was chosen by the patient a Brad Pineda in [employer]. He didn't like nasal pillows. Today the patient brought his machine , documenting 75% compliance and residual AHI 3.2 . Average use time is  5 hrs 48 min. He used it 75% off all days in the last 2 month.  The  patient's machine does now  have a humidifier,I had the autotitration between 8 and 12 cm water pressure, the machine is now set at 8 cm. varied  pressure by 7-10 cm to see if we can reduce the average, residual AHI from 3.2 any further. Epworth 7 , FSS 30, and GDS 2 points,   General: The patient is awake, alert and appears not in acute distress. The patient is well groomed. Head: Normocephalic, atraumatic. Neck is supple. No nasal deviation.  Cardiovascular:  Regular rate and rhythm, without  murmurs or carotid bruit, and without distended neck veins. Respiratory: Lungs are clear to auscultation. Skin:  Without evidence of edema, or rash Trunk: BMI is  elevated and patient  has normal posture.   Neurologic exam : The patient is awake and alert, oriented to place and time.  Memory subjective described as intact. There is a normal attention span & concentration ability. Speech is fluent without dysarthria, dysphonia or aphasia. Mood and affect are appropriate. Cranial nerves: Pupils are equal and briskly reactive to light. Funduscopic exam without  evidence of pallor or edema. Extraocular movements  in vertical and horizontal planes intact and without nystagmus. Visual fields by finger perimetry are intact. Hearing to finger rub intact.  Facial sensation intact to fine touch. Facial motor strength is symmetric and  tongue and uvula move midline.  Motor exam:   Normal tone and normal muscle bulk and symmetric normal strength in all extremities.  Sensory:  Fine touch, pinprick and vibration were tested in all extremities.   Coordination: Rapid alternating movements in the fingers/hands is  normal.   Gait and station: Patient walks without assistive device. Deep tendon reflexes: in the  upper and lower extremities are symmetric and intact. Babinski maneuver response is downgoing.   Assessment:  After physical and neurologic examination, review of laboratory studies, imaging, neurophysiology testing and pre-existing records, assessment will be reviewed on the problem list. 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 Compliance could not be established , he forgot the machine and chip. DME [facility] . Plan:  Treatment plan and additional workup will be reviewed under Problem List. Start Nuvigil for residual sleepiness in treated OSA.  Weight loss discussion and information. Exercise.

## 2013-12-10 NOTE — Patient Instructions (Signed)
Calorie Counting Diet A calorie counting diet requires you to eat the number of calories that are right for you in a day. Calories are the measurement of how much energy you get from the food you eat. Eating the right amount of calories is important for staying at a healthy weight. If you eat too many calories, your body will store them as fat and you may gain weight. If you eat too few calories, you may lose weight. Counting the number of calories you eat during a day will help you know if you are eating the right amount. A Registered Dietitian can determine how many calories you need in a day. The amount of calories needed varies from person to person. If your goal is to lose weight, you will need to eat fewer calories. Losing weight can benefit you if you are overweight or have health problems such as heart disease, high blood pressure, or diabetes. If your goal is to gain weight, you will need to eat more calories. Gaining weight may be necessary if you have a certain health problem that causes your body to need more energy. TIPS Whether you are increasing or decreasing the number of calories you eat during a day, it may be hard to get used to changes in what you eat and drink. The following are tips to help you keep track of the number of calories you eat.  Measure foods at home with measuring cups. This helps you know the amount of food and number of calories you are eating.  Restaurants often serve food in amounts that are larger than 1 serving. While eating out, estimate how many servings of a food you are given. For example, a serving of cooked rice is  cup or about the size of half of a fist. Knowing serving sizes will help you be aware of how much food you are eating at restaurants.  Ask for smaller portion sizes or child-size portions at restaurants.  Plan to eat half of a meal at a restaurant. Take the rest home or share the other half with a friend.  Read the Nutrition Facts panel on  food labels for calorie content and serving size. You can find out how many servings are in a package, the size of a serving, and the number of calories each serving has.  For example, a package might contain 3 cookies. The Nutrition Facts panel on that package says that 1 serving is 1 cookie. Below that, it will say there are 3 servings in the container. The calories section of the Nutrition Facts label says there are 90 calories. This means there are 90 calories in 1 cookie (1 serving). If you eat 1 cookie you have eaten 90 calories. If you eat all 3 cookies, you have eaten 270 calories (3 servings x 90 calories = 270 calories). The list below tells you how big or small some common portion sizes are.  1 oz.........4 stacked dice.  3 oz........Marland KitchenDeck of cards.  1 tsp.......Marland KitchenTip of little finger.  1 tbs......Marland KitchenMarland KitchenThumb.  2 tbs.......Marland KitchenGolf ball.   cup......Marland KitchenHalf of a fist.  1 cup.......Marland KitchenA fist. KEEP A FOOD LOG Write down every food item you eat, the amount you eat, and the number of calories in each food you eat during the day. At the end of the day, you can add up the total number of calories you have eaten. It may help to keep a list like the one below. Find out the calorie information by reading the  Nutrition Facts panel on food labels. Breakfast  Bran cereal (1 cup, 110 calories).  Fat-free milk ( cup, 45 calories). Snack  Apple (1 medium, 80 calories). Lunch  Spinach (1 cup, 20 calories).  Tomato ( medium, 20 calories).  Chicken breast strips (3 oz, 165 calories).  Shredded cheddar cheese ( cup, 110 calories).  Light [place] dressing (2 tbs, 60 calories).  Whole-wheat bread (1 slice, 80 calories).  Tub margarine (1 tsp, 35 calories).  Vegetable soup (1 cup, 160 calories). Dinner  Pork chop (3 oz, 190 calories).  Brown rice (1 cup, 215 calories).  Steamed broccoli ( cup, 20 calories).  Strawberries (1  cup, 65 calories).  Whipped cream (1 tbs, 50  calories). Daily Calorie Total: 8614 Document Released: 07/24/2005 Document Revised: 10/16/2011 Document Reviewed: 01/18/2007 [facility] Patient Information 2014 [place].

## 2014-02-06 ENCOUNTER — Emergency Department (HOSPITAL_COMMUNITY): Payer: Managed Care, Other (non HMO)

## 2014-02-06 ENCOUNTER — Emergency Department (HOSPITAL_COMMUNITY)
Admission: EM | Admit: 2014-02-06 | Discharge: 2014-02-06 | Disposition: A | Discharge status: Home / Self Care | Payer: Managed Care, Other (non HMO) | Attending: Emergency Medicine | Admitting: Emergency Medicine

## 2014-02-06 ENCOUNTER — Encounter (HOSPITAL_COMMUNITY): Payer: Self-pay | Admitting: Emergency Medicine

## 2014-02-06 DIAGNOSIS — Z7982 Long term (current) use of aspirin: Secondary | ICD-10-CM | POA: Insufficient documentation

## 2014-02-06 DIAGNOSIS — S82852A Displaced trimalleolar fracture of left lower leg, initial encounter for closed fracture: Secondary | ICD-10-CM

## 2014-02-06 DIAGNOSIS — G4730 Sleep apnea, unspecified: Secondary | ICD-10-CM | POA: Insufficient documentation

## 2014-02-06 DIAGNOSIS — Z79899 Other long term (current) drug therapy: Secondary | ICD-10-CM | POA: Insufficient documentation

## 2014-02-06 DIAGNOSIS — Y929 Unspecified place or not applicable: Secondary | ICD-10-CM | POA: Insufficient documentation

## 2014-02-06 DIAGNOSIS — S82853A Displaced trimalleolar fracture of unspecified lower leg, initial encounter for closed fracture: Secondary | ICD-10-CM | POA: Insufficient documentation

## 2014-02-06 DIAGNOSIS — K219 Gastro-esophageal reflux disease without esophagitis: Secondary | ICD-10-CM | POA: Insufficient documentation

## 2014-02-06 DIAGNOSIS — W010XXA Fall on same level from slipping, tripping and stumbling without subsequent striking against object, initial encounter: Secondary | ICD-10-CM | POA: Insufficient documentation

## 2014-02-06 DIAGNOSIS — F329 Major depressive disorder, single episode, unspecified: Secondary | ICD-10-CM | POA: Insufficient documentation

## 2014-02-06 DIAGNOSIS — E119 Type 2 diabetes mellitus without complications: Secondary | ICD-10-CM | POA: Insufficient documentation

## 2014-02-06 DIAGNOSIS — E785 Hyperlipidemia, unspecified: Secondary | ICD-10-CM | POA: Insufficient documentation

## 2014-02-06 DIAGNOSIS — Z8781 Personal history of (healed) traumatic fracture: Secondary | ICD-10-CM | POA: Insufficient documentation

## 2014-02-06 DIAGNOSIS — F3289 Other specified depressive episodes: Secondary | ICD-10-CM | POA: Insufficient documentation

## 2014-02-06 DIAGNOSIS — I10 Essential (primary) hypertension: Secondary | ICD-10-CM | POA: Insufficient documentation

## 2014-02-06 DIAGNOSIS — F411 Generalized anxiety disorder: Secondary | ICD-10-CM | POA: Insufficient documentation

## 2014-02-06 DIAGNOSIS — Y9389 Activity, other specified: Secondary | ICD-10-CM | POA: Insufficient documentation

## 2014-02-06 DIAGNOSIS — Z9981 Dependence on supplemental oxygen: Secondary | ICD-10-CM | POA: Insufficient documentation

## 2014-02-06 HISTORY — DX: Displaced trimalleolar fracture of left lower leg, initial encounter for closed fracture: S82.852A

## 2014-02-06 LAB — CBG MONITORING, ED: Glucose-Capillary: 190 mg/dL — ABNORMAL HIGH (ref 70–99)

## 2014-02-06 LAB — CBC WITH DIFFERENTIAL/PLATELET
BASOS PCT: 0 % (ref 0–1)
Basophils Absolute: 0 10*3/uL (ref 0.0–0.1)
Eosinophils Absolute: 0 10*3/uL (ref 0.0–0.7)
Eosinophils Relative: 1 % (ref 0–5)
HCT: 40 % (ref 39.0–52.0)
Hemoglobin: 13.7 g/dL (ref 13.0–17.0)
Lymphocytes Relative: 41 % (ref 12–46)
Lymphs Abs: 2.1 10*3/uL (ref 0.7–4.0)
MCH: 28.8 pg (ref 26.0–34.0)
MCHC: 34.3 g/dL (ref 30.0–36.0)
MCV: 84.2 fL (ref 78.0–100.0)
Monocytes Absolute: 0.5 10*3/uL (ref 0.1–1.0)
Monocytes Relative: 9 % (ref 3–12)
NEUTROS ABS: 2.5 10*3/uL (ref 1.7–7.7)
NEUTROS PCT: 49 % (ref 43–77)
PLATELETS: 245 10*3/uL (ref 150–400)
RBC: 4.75 MIL/uL (ref 4.22–5.81)
RDW: 12.9 % (ref 11.5–15.5)
WBC: 5.1 10*3/uL (ref 4.0–10.5)

## 2014-02-06 LAB — COMPREHENSIVE METABOLIC PANEL
ALBUMIN: 4 g/dL (ref 3.5–5.2)
ALK PHOS: 58 U/L (ref 39–117)
ALT: 55 U/L — ABNORMAL HIGH (ref 0–53)
AST: 30 U/L (ref 0–37)
Anion gap: 13 (ref 5–15)
BILIRUBIN TOTAL: 0.3 mg/dL (ref 0.3–1.2)
BUN: 16 mg/dL (ref 6–23)
CHLORIDE: 98 meq/L (ref 96–112)
CO2: 25 mEq/L (ref 19–32)
Calcium: 9 mg/dL (ref 8.4–10.5)
Creatinine, Ser: 0.98 mg/dL (ref 0.50–1.35)
GFR calc Af Amer: >90 mL/min (ref >90)
GFR calc non Af Amer: >90 mL/min (ref >90)
Glucose, Bld: 243 mg/dL — ABNORMAL HIGH (ref 70–99)
POTASSIUM: 4.1 meq/L (ref 3.7–5.3)
SODIUM: 136 meq/L — AB (ref 137–147)
TOTAL PROTEIN: 6.8 g/dL (ref 6.0–8.3)

## 2014-02-06 LAB — TROPONIN I: Troponin I: <0.3 ng/mL (ref <0.30)

## 2014-02-06 MED ORDER — FENTANYL CITRATE 0.05 MG/ML IJ SOLN
100.0000 ug | Freq: Once | INTRAMUSCULAR | Status: AC
  Administered 2014-02-06: 100 ug via INTRAVENOUS

## 2014-02-06 MED ORDER — HYDROMORPHONE HCL PF 1 MG/ML IJ SOLN
1.0000 mg | Freq: Once | INTRAMUSCULAR | Status: AC
  Administered 2014-02-06: 1 mg via INTRAVENOUS
  Filled 2014-02-06: qty 1

## 2014-02-06 MED ORDER — MIDAZOLAM HCL 2 MG/2ML IJ SOLN
1.0000 mg | Freq: Once | INTRAMUSCULAR | Status: AC
  Administered 2014-02-06: 1 mg via INTRAVENOUS

## 2014-02-06 MED ORDER — OXYCODONE-ACETAMINOPHEN 5-325 MG PO TABS
2.0000 | ORAL_TABLET | ORAL | Status: DC | PRN
Start: 2014-02-06 — End: 2014-09-08

## 2014-02-06 NOTE — ED Notes (Signed)
Per EMS, Pt, from home, c/o L ankle injury after a slip and fall.  Pain score 10/10.  519mg Fentanyl given en route.  Pt reports that he slipped and fell while walking his dog.

## 2014-02-06 NOTE — ED Provider Notes (Signed)
5:43 PM Care transferred from Dr. Wyvonnia Dusky on 38 y.o. male who presented w/ a displaced L ankle fx/dislocation. He is now s/p two reduction attempts with about 73m improvement of displacement from first attempt on post-reduction films. Will follow d/c plan as outlined by Dr. RWyvonnia Duskyfor Pt to f/u with Dr. MPercell Milleron Monday.  NWB LLE.   6:10 PM Plan confirmed w/ Dr. MPercell Miller he will go to office 8:30am.   1. Trimalleolar fracture of ankle, closed, left, initial encounter      MNeta Ehlers MD 02/06/14 1811

## 2014-02-06 NOTE — Discharge Instructions (Signed)
Ankle Fracture Follow up with Dr. Percell Miller on Monday. Return to the ED if you develop worsening pain, swelling, numbness, tingling, color or temperature change, or any other concerns. A fracture is a break in a bone. The ankle joint is made up of three bones. These include the lower (distal)sections of your lower leg bones, called the tibia and fibula, along with a bone in your foot, called the talus. Depending on how bad the break is and if more than one ankle joint bone is broken, a cast or splint is used to protect and keep your injured bone from moving while it heals. Sometimes, surgery is required to help the fracture heal properly.  There are two general types of fractures:  Stable fracture. This includes a single fracture line through one bone, with no injury to ankle ligaments. A fracture of the talus that does not have any displacement (movement of the bone on either side of the fracture line) is also stable.  Unstable fracture. This includes more than one fracture line through one or more bones in the ankle joint. It also includes fractures that have displacement of the bone on either side of the fracture line. CAUSES  A direct blow to the ankle.   Quickly and severely twisting your ankle.  Trauma, such as a car accident or falling from a significant height. RISK FACTORS You may be at a higher risk of ankle fracture if:  You have certain medical conditions.  You are involved in high-impact sports.  You are involved in a high-impact car accident. SIGNS AND SYMPTOMS   Tender and swollen ankle.  Bruising around the injured ankle.  Pain on movement of the ankle.  Difficulty walking or putting weight on the ankle.  A cold foot below the site of the ankle injury. This can occur if the blood vessels passing through your injured ankle were also damaged.  Numbness in the foot below the site of the ankle injury. DIAGNOSIS  An ankle fracture is usually diagnosed with a physical  exam and X-rays. A CT scan may also be required for complex fractures. TREATMENT  Stable fractures are treated with a cast or splint and using crutches to avoid putting weight on your injured ankle. This is followed by an ankle strengthening program. Some patients require a special type of cast, depending on other medical problems they may have. Unstable fractures require surgery to ensure the bones heal properly. Your health care provider will tell you what type of fracture you have and the best treatment for your condition. HOME CARE INSTRUCTIONS   Review correct crutch use with your health care provider and use your crutches as directed. Safe use of crutches is extremely important. Misuse of crutches can cause you to fall or cause injury to nerves in your hands or armpits.  Do not put weight or pressure on the injured ankle until directed by your health care provider.  To lessen the swelling, keep the injured leg elevated while sitting or lying down.  Apply ice to the injured area:  Put ice in a plastic bag.  Place a towel between your cast and the bag.  Leave the ice on for 20 minutes, 2-3 times a day.  If you have a plaster or fiberglass cast:  Do not try to scratch the skin under the cast with any objects. This can increase your risk of skin infection.  Check the skin around the cast every day. You may put lotion on any red or sore  areas.  Keep your cast dry and clean.  If you have a plaster splint:  Wear the splint as directed.  You may loosen the elastic around the splint if your toes become numb, tingle, or turn cold or blue.  Do not put pressure on any part of your cast or splint; it may break. Rest your cast only on a pillow the first 24 hours until it is fully hardened.  Your cast or splint can be protected during bathing with a plastic bag sealed to your skin with medical tape. Do not lower the cast or splint into water.  Take medicines as directed by your health  care provider. Only take over-the-counter or prescription medicines for pain, discomfort, or fever as directed by your health care provider.  Do not drive a vehicle until your health care provider specifically tells you it is safe to do so.  If your health care provider has given you a follow-up appointment, it is very important to keep that appointment. Not keeping the appointment could result in a chronic or permanent injury, pain, and disability. If you have any problem keeping the appointment, call the facility for assistance. SEEK MEDICAL CARE IF: You develop increased swelling or discomfort. SEEK IMMEDIATE MEDICAL CARE IF:   Your cast gets damaged or breaks.  You have continued severe pain.  You develop new pain or swelling after the cast was put on.  Your skin or toenails below the injury turn blue or gray.  Your skin or toenails below the injury feel cold, numb, or have loss of sensitivity to touch.  There is a bad smell or pus draining from under the cast. MAKE SURE YOU:   Understand these instructions.  Will watch your condition.  Will get help right away if you are not doing well or get worse. Document Released: 07/21/2000 Document Revised: 07/29/2013 Document Reviewed: 02/20/2013 [facility] Patient Information 2015 [place], [place]. This information is not intended to replace advice given to you by your health care provider. Make sure you discuss any questions you have with your health care provider.

## 2014-02-06 NOTE — ED Notes (Signed)
Ortho tech at bedside 

## 2014-02-06 NOTE — ED Notes (Signed)
Bed: RESB Expected date:  Expected time:  Means of arrival:  Comments: EMS- ankle deformity
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
## 2014-02-06 NOTE — ED Notes (Signed)
Pt escorted to discharge window. Verbalized understanding discharge instructions. In no acute distress.

## 2014-02-06 NOTE — ED Notes (Signed)
Pt ankle reduced and ortho tech at bedside placing splint

## 2014-02-06 NOTE — ED Notes (Signed)
New splint applied. Pain current 10/10.

## 2014-02-06 NOTE — ED Notes (Addendum)
Dr Wyvonnia Dusky at pt bedside with ortho tech to reduce pt ankle. All pts pulses have been found using doppler by Dr. Wyvonnia Dusky

## 2014-02-06 NOTE — ED Notes (Addendum)
Ortho tech reports that the Pt is ready for discharge.   Per Tawnya Crook MD, she is consulting Ortho, again, before Pt is discharged.

## 2014-02-06 NOTE — ED Notes (Signed)
Pt able to wiggle toes, has good pulses and good color to L foot

## 2014-02-09 ENCOUNTER — Encounter (HOSPITAL_BASED_OUTPATIENT_CLINIC_OR_DEPARTMENT_OTHER): Payer: Self-pay | Admitting: *Deleted

## 2014-02-09 NOTE — H&P (Signed)
ORTHOPAEDIC CONSULTATION  REQUESTING PHYSICIAN: Renette Butters, MD  Chief Complaint: Left trimal ankle fx  HPI: Brad Pineda. is a 38 y.o. male who complains of  pain  Past Medical History  Diagnosis Date  . GERD (gastroesophageal reflux disease)   . Anxiety   . Hyperlipidemia   . Depression   . Sleep apnea     uses CPAP nightly  . Hypertension     under control with meds., has been on med. since 2009  . Diabetes mellitus type 2, noninsulin dependent   . Seasonal allergies   . Trimalleolar fracture of left ankle 02/06/2014  . Family history of anesthesia complication     states father is hard to wake up post-op   Past Surgical History  Procedure Laterality Date  . No past surgeries     History   Social History  . Marital Status: Married    Spouse Name: Dorine    Number of Children: 0  . Years of Education: 16   Occupational History  . Hardware Associate [employer]   Social History Main Topics  . Smoking status: Never Smoker   . Smokeless tobacco: Never Used  . Alcohol Use: No  . Drug Use: No  . Sexual Activity: Yes    Partners: Female   Other Topics Concern  . None   Social History Narrative   Patient is married (Dorine) and Lives with his wife.   Patient is working full-time.   Patient has a BA degree.   Patient is right-handed.   Patient drinks two cups of soda and tea occasionally.   Family History  Problem Relation Age of Onset  . Hypertension Mother   . Diabetes Mother   . Kidney disease Father   . Anesthesia problems Father     hard to wake up post-op   No Known Allergies Prior to Admission medications   Medication Sig Start Date End Date Taking? Authorizing Provider  aspirin 81 MG tablet Take 162 mg by mouth once. Takes 3 qd   Yes Historical Provider, MD  cetirizine (ZYRTEC) 10 MG tablet Take 10 mg by mouth daily.   Yes Historical Provider, MD  enalapril (VASOTEC) 10 MG tablet Take 1 tablet (10 mg total) by mouth  daily. 05/13/13  Yes Chelle S Jeffery, PA-C  fenofibrate (TRICOR) 145 MG tablet Take 1 tablet (145 mg total) by mouth daily. 02/04/13  Yes Chelle S Jeffery, PA-C  fish oil-omega-3 fatty acids 1000 MG capsule Take 1 g by mouth daily.    Yes Historical Provider, MD  glipiZIDE (GLUCOTROL XL) 5 MG 24 hr tablet Take 5 mg by mouth daily. With largest meal   Yes Historical Provider, MD  hydrochlorothiazide (HYDRODIURIL) 25 MG tablet Take 25 mg by mouth daily.   Yes Historical Provider, MD  ibuprofen (ADVIL,MOTRIN) 200 MG tablet Take 200 mg by mouth every 6 (six) hours as needed.   Yes Historical Provider, MD  ipratropium (ATROVENT) 0.03 % nasal spray Place 2 sprays into the nose 2 (two) times daily. 08/19/13 08/19/14 Yes Chelle S Jeffery, PA-C  lansoprazole (PREVACID) 15 MG capsule Take 15 mg by mouth daily.   Yes Historical Provider, MD  losartan (COZAAR) 50 MG tablet Take 50 mg by mouth daily.   Yes Historical Provider, MD  metFORMIN (GLUCOPHAGE) 500 MG tablet Take 1 tablet (500 mg total) by mouth 2 (two) times daily with a meal. 11/25/13  Yes Chelle S Jeffery, PA-C  niacin (NIASPAN) 500  MG CR tablet Take 500 mg by mouth daily.   Yes Historical Provider, MD  NUVIGIL 250 MG tablet Take 1 tablet (250 mg total) by mouth daily. 12/10/13  Yes Asencion Partridge Dohmeier, MD  oxyCODONE-acetaminophen (PERCOCET/ROXICET) 5-325 MG per tablet Take 2 tablets by mouth every 4 (four) hours as needed for severe pain. 02/06/14  Yes Ezequiel Essex, MD  PARoxetine (PAXIL) 40 MG tablet Take 40 mg by mouth daily.   Yes Historical Provider, MD  pravastatin (PRAVACHOL) 40 MG tablet Take 40 mg by mouth daily.   Yes Historical Provider, MD  sitaGLIPtin (JANUVIA) 100 MG tablet Take 1 tablet (100 mg total) by mouth daily. 08/19/13  Yes Chelle S Jeffery, PA-C   No results found.  Positive ROS: All other systems have been reviewed and were otherwise negative with the exception of those mentioned in the HPI and as above.  Labs cbc  Recent Labs   02/06/14 1506  WBC 5.1  HGB 13.7  HCT 40.0  PLT 245    Labs inflam No results found for this basename: ESR, CRP,  in the last 72 hours  Labs coag No results found for this basename: INR, PT, PTT,  in the last 72 hours   Recent Labs  02/06/14 1506  NA 136*  K 4.1  CL 98  CO2 25  GLUCOSE 243*  BUN 16  CREATININE 0.98  CALCIUM 9.0    Physical Exam: There were no vitals filed for this visit. General: Alert, no acute distress Cardiovascular: No pedal edema Respiratory: No cyanosis, no use of accessory musculature GI: No organomegaly, abdomen is soft and non-tender Skin: No lesions in the area of chief complaint Neurologic: Sensation intact distally Psychiatric: Patient is competent for consent with normal mood and affect Lymphatic: No axillary or cervical lymphadenopathy  MUSCULOSKELETAL:  NVI, small medial blister Other extremities are atraumatic with painless ROM and NVI.  Assessment: Left trimal  Plan: ORIF    Edmonia Lynch, D, MD Cell [phone]   02/09/2014 2:44 PM

## 2014-02-10 ENCOUNTER — Ambulatory Visit (HOSPITAL_BASED_OUTPATIENT_CLINIC_OR_DEPARTMENT_OTHER): Payer: Managed Care, Other (non HMO) | Admitting: Anesthesiology

## 2014-02-10 ENCOUNTER — Encounter (HOSPITAL_BASED_OUTPATIENT_CLINIC_OR_DEPARTMENT_OTHER): Payer: Self-pay | Admitting: Certified Registered"

## 2014-02-10 ENCOUNTER — Ambulatory Visit (HOSPITAL_BASED_OUTPATIENT_CLINIC_OR_DEPARTMENT_OTHER)
Admission: RE | Admit: 2014-02-10 | Discharge: 2014-02-10 | Disposition: A | Discharge status: Home / Self Care | Payer: Managed Care, Other (non HMO) | Source: Ambulatory Visit | Attending: Orthopedic Surgery | Admitting: Orthopedic Surgery

## 2014-02-10 ENCOUNTER — Other Ambulatory Visit: Payer: Self-pay | Admitting: Physician Assistant

## 2014-02-10 ENCOUNTER — Encounter (HOSPITAL_BASED_OUTPATIENT_CLINIC_OR_DEPARTMENT_OTHER)
Admission: RE | Disposition: A | Discharge status: Home / Self Care | Payer: Self-pay | Source: Ambulatory Visit | Attending: Orthopedic Surgery

## 2014-02-10 ENCOUNTER — Encounter (HOSPITAL_BASED_OUTPATIENT_CLINIC_OR_DEPARTMENT_OTHER): Payer: Managed Care, Other (non HMO) | Admitting: Anesthesiology

## 2014-02-10 DIAGNOSIS — Z7982 Long term (current) use of aspirin: Secondary | ICD-10-CM | POA: Insufficient documentation

## 2014-02-10 DIAGNOSIS — F3289 Other specified depressive episodes: Secondary | ICD-10-CM | POA: Insufficient documentation

## 2014-02-10 DIAGNOSIS — K219 Gastro-esophageal reflux disease without esophagitis: Secondary | ICD-10-CM | POA: Insufficient documentation

## 2014-02-10 DIAGNOSIS — F329 Major depressive disorder, single episode, unspecified: Secondary | ICD-10-CM | POA: Insufficient documentation

## 2014-02-10 DIAGNOSIS — E785 Hyperlipidemia, unspecified: Secondary | ICD-10-CM | POA: Insufficient documentation

## 2014-02-10 DIAGNOSIS — S82853A Displaced trimalleolar fracture of unspecified lower leg, initial encounter for closed fracture: Secondary | ICD-10-CM | POA: Insufficient documentation

## 2014-02-10 DIAGNOSIS — I10 Essential (primary) hypertension: Secondary | ICD-10-CM | POA: Insufficient documentation

## 2014-02-10 DIAGNOSIS — G4730 Sleep apnea, unspecified: Secondary | ICD-10-CM | POA: Insufficient documentation

## 2014-02-10 DIAGNOSIS — F411 Generalized anxiety disorder: Secondary | ICD-10-CM | POA: Insufficient documentation

## 2014-02-10 DIAGNOSIS — E119 Type 2 diabetes mellitus without complications: Secondary | ICD-10-CM | POA: Insufficient documentation

## 2014-02-10 DIAGNOSIS — X58XXXA Exposure to other specified factors, initial encounter: Secondary | ICD-10-CM | POA: Insufficient documentation

## 2014-02-10 HISTORY — DX: Depression, unspecified: F32.A

## 2014-02-10 HISTORY — DX: Major depressive disorder, single episode, unspecified: F32.9

## 2014-02-10 HISTORY — DX: Type 2 diabetes mellitus without complications: E11.9

## 2014-02-10 HISTORY — DX: Hyperlipidemia, unspecified: E78.5

## 2014-02-10 HISTORY — DX: Displaced trimalleolar fracture of left lower leg, initial encounter for closed fracture: S82.852A

## 2014-02-10 HISTORY — DX: Family history of other specified conditions: Z84.89

## 2014-02-10 HISTORY — DX: Other seasonal allergic rhinitis: J30.2

## 2014-02-10 HISTORY — DX: Essential (primary) hypertension: I10

## 2014-02-10 HISTORY — PX: ORIF ANKLE FRACTURE: SHX5408

## 2014-02-10 LAB — GLUCOSE, CAPILLARY
GLUCOSE-CAPILLARY: 272 mg/dL — AB (ref 70–99)
GLUCOSE-CAPILLARY: 273 mg/dL — AB (ref 70–99)
Glucose-Capillary: 239 mg/dL — ABNORMAL HIGH (ref 70–99)

## 2014-02-10 LAB — POCT HEMOGLOBIN-HEMACUE: HEMOGLOBIN: 14.2 g/dL (ref 13.0–17.0)

## 2014-02-10 SURGERY — OPEN REDUCTION INTERNAL FIXATION (ORIF) ANKLE FRACTURE
Anesthesia: Regional | Site: Ankle | Laterality: Left

## 2014-02-10 MED ORDER — PROPOFOL 10 MG/ML IV BOLUS
INTRAVENOUS | Status: DC | PRN
  Administered 2014-02-10: 200 mg via INTRAVENOUS

## 2014-02-10 MED ORDER — MIDAZOLAM HCL 2 MG/2ML IJ SOLN
1.0000 mg | INTRAMUSCULAR | Status: DC | PRN
  Administered 2014-02-10: 2 mg via INTRAVENOUS

## 2014-02-10 MED ORDER — ONDANSETRON HCL 4 MG/2ML IJ SOLN
INTRAMUSCULAR | Status: DC | PRN
  Administered 2014-02-10: 4 mg via INTRAVENOUS

## 2014-02-10 MED ORDER — PROPOFOL 10 MG/ML IV EMUL
INTRAVENOUS | Status: AC
  Filled 2014-02-10: qty 50

## 2014-02-10 MED ORDER — FENTANYL CITRATE 0.05 MG/ML IJ SOLN
50.0000 ug | INTRAMUSCULAR | Status: DC | PRN
  Administered 2014-02-10: 100 ug via INTRAVENOUS

## 2014-02-10 MED ORDER — FENTANYL CITRATE 0.05 MG/ML IJ SOLN
INTRAMUSCULAR | Status: AC
  Filled 2014-02-10: qty 2

## 2014-02-10 MED ORDER — CEFAZOLIN SODIUM-DEXTROSE 2-3 GM-% IV SOLR
2.0000 g | INTRAVENOUS | Status: AC
  Administered 2014-02-10: 2 g via INTRAVENOUS

## 2014-02-10 MED ORDER — MIDAZOLAM HCL 2 MG/ML PO SYRP: 12.0000 mg | ORAL_SOLUTION | Freq: Once | ORAL | Status: DC | PRN

## 2014-02-10 MED ORDER — MIDAZOLAM HCL 2 MG/2ML IJ SOLN
INTRAMUSCULAR | Status: AC
  Filled 2014-02-10: qty 2

## 2014-02-10 MED ORDER — LIDOCAINE HCL (CARDIAC) 20 MG/ML IV SOLN
INTRAVENOUS | Status: DC | PRN
  Administered 2014-02-10: 40 mg via INTRAVENOUS

## 2014-02-10 MED ORDER — FENTANYL CITRATE 0.05 MG/ML IJ SOLN
INTRAMUSCULAR | Status: AC
  Filled 2014-02-10: qty 6

## 2014-02-10 MED ORDER — DEXTROSE-NACL 5-0.45 % IV SOLN: 100.0000 mL/h | INTRAVENOUS | Status: DC

## 2014-02-10 MED ORDER — HYDROMORPHONE HCL PF 1 MG/ML IJ SOLN
0.2500 mg | INTRAMUSCULAR | Status: DC | PRN
  Administered 2014-02-10 (×2): 0.5 mg via INTRAVENOUS

## 2014-02-10 MED ORDER — ACETAMINOPHEN 500 MG PO TABS
1000.0000 mg | ORAL_TABLET | Freq: Once | ORAL | Status: AC
  Administered 2014-02-10: 1000 mg via ORAL

## 2014-02-10 MED ORDER — DOCUSATE SODIUM 100 MG PO CAPS: 100.0000 mg | ORAL_CAPSULE | Freq: Two times a day (BID) | ORAL | Status: DC

## 2014-02-10 MED ORDER — SUCCINYLCHOLINE CHLORIDE 20 MG/ML IJ SOLN
INTRAMUSCULAR | Status: AC
  Filled 2014-02-10: qty 1

## 2014-02-10 MED ORDER — HYDROMORPHONE HCL PF 1 MG/ML IJ SOLN
INTRAMUSCULAR | Status: AC
  Filled 2014-02-10: qty 1

## 2014-02-10 MED ORDER — FENTANYL CITRATE 0.05 MG/ML IJ SOLN
INTRAMUSCULAR | Status: DC | PRN
  Administered 2014-02-10: 25 ug via INTRAVENOUS

## 2014-02-10 MED ORDER — OXYCODONE HCL 5 MG PO TABS
ORAL_TABLET | ORAL | Status: AC
  Filled 2014-02-10: qty 1

## 2014-02-10 MED ORDER — OXYCODONE HCL 5 MG PO TABS
5.0000 mg | ORAL_TABLET | Freq: Once | ORAL | Status: AC
  Administered 2014-02-10: 5 mg via ORAL

## 2014-02-10 MED ORDER — CEFAZOLIN SODIUM-DEXTROSE 2-3 GM-% IV SOLR
INTRAVENOUS | Status: AC
  Filled 2014-02-10: qty 50

## 2014-02-10 MED ORDER — ONDANSETRON HCL 4 MG PO TABS: 4.0000 mg | ORAL_TABLET | Freq: Three times a day (TID) | ORAL | Status: DC | PRN

## 2014-02-10 MED ORDER — LACTATED RINGERS IV SOLN
INTRAVENOUS | Status: DC
  Administered 2014-02-10 (×4): via INTRAVENOUS

## 2014-02-10 MED ORDER — OXYCODONE-ACETAMINOPHEN 5-325 MG PO TABS: 2.0000 | ORAL_TABLET | ORAL | Status: DC | PRN

## 2014-02-10 MED ORDER — ACETAMINOPHEN 500 MG PO TABS
ORAL_TABLET | ORAL | Status: AC
  Filled 2014-02-10: qty 2

## 2014-02-10 SURGICAL SUPPLY — 83 items
BANDAGE ELASTIC 4 VELCRO ST LF (GAUZE/BANDAGES/DRESSINGS) ×2 IMPLANT
BANDAGE ELASTIC 6 VELCRO ST LF (GAUZE/BANDAGES/DRESSINGS) ×2 IMPLANT
BANDAGE ESMARK 6X9 LF (GAUZE/BANDAGES/DRESSINGS) ×1 IMPLANT
BIT DRILL 2.5X125 (BIT) ×2 IMPLANT
BIT DRILL 3.5X125 (BIT) ×1 IMPLANT
BIT DRILL CANN 2.7 (BIT) ×2
BIT DRILL SRG 2.7XCANN AO CPLG (BIT) ×2 IMPLANT
BIT DRL SRG 2.7XCANN AO CPLNG (BIT) ×2
BLADE SURG 15 STRL LF DISP TIS (BLADE) ×2 IMPLANT
BLADE SURG 15 STRL SS (BLADE) ×2
BNDG COHESIVE 4X5 TAN STRL (GAUZE/BANDAGES/DRESSINGS) ×2 IMPLANT
BNDG ESMARK 6X9 LF (GAUZE/BANDAGES/DRESSINGS) ×2
CHLORAPREP W/TINT 26ML (MISCELLANEOUS) ×2 IMPLANT
COUNTERSINK ×2 IMPLANT
COVER MAYO STAND STRL (DRAPES) ×2 IMPLANT
COVER TABLE BACK 60X90 (DRAPES) ×2 IMPLANT
CUFF TOURNIQUET SINGLE 24IN (TOURNIQUET CUFF) ×2 IMPLANT
CUFF TOURNIQUET SINGLE 34IN LL (TOURNIQUET CUFF) IMPLANT
DECANTER SPIKE VIAL GLASS SM (MISCELLANEOUS) IMPLANT
DRAPE C-ARM 42X72 X-RAY (DRAPES) IMPLANT
DRAPE C-ARMOR (DRAPES) IMPLANT
DRAPE EXTREMITY T 121X128X90 (DRAPE) ×2 IMPLANT
DRAPE OEC MINIVIEW 54X84 (DRAPES) ×2 IMPLANT
DRAPE U 20/CS (DRAPES) ×2 IMPLANT
DRAPE U-SHAPE 47X51 STRL (DRAPES) ×2 IMPLANT
DRILL BIT 3.5X125 (BIT) ×1
DRSG EMULSION OIL 3X3 NADH (GAUZE/BANDAGES/DRESSINGS) ×2 IMPLANT
ELECT REM PT RETURN 9FT ADLT (ELECTROSURGICAL) ×2
ELECTRODE REM PT RTRN 9FT ADLT (ELECTROSURGICAL) ×1 IMPLANT
GAUZE SPONGE 4X4 12PLY STRL (GAUZE/BANDAGES/DRESSINGS) ×2 IMPLANT
GLOVE BIO SURGEON STRL SZ7.5 (GLOVE) ×2 IMPLANT
GLOVE BIO SURGEON STRL SZ8 (GLOVE) ×2 IMPLANT
GLOVE BIOGEL PI IND STRL 6.5 (GLOVE) ×1 IMPLANT
GLOVE BIOGEL PI IND STRL 7.0 (GLOVE) ×1 IMPLANT
GLOVE BIOGEL PI IND STRL 8 (GLOVE) ×1 IMPLANT
GLOVE BIOGEL PI INDICATOR 6.5 (GLOVE) ×1
GLOVE BIOGEL PI INDICATOR 7.0 (GLOVE) ×1
GLOVE BIOGEL PI INDICATOR 8 (GLOVE) ×1
GLOVE ECLIPSE 6.5 STRL STRAW (GLOVE) ×2 IMPLANT
GLOVE ECLIPSE 7.0 STRL STRAW (GLOVE) ×2 IMPLANT
GOWN STRL REUS W/ TWL LRG LVL3 (GOWN DISPOSABLE) ×3 IMPLANT
GOWN STRL REUS W/ TWL XL LVL3 (GOWN DISPOSABLE) IMPLANT
GOWN STRL REUS W/TWL LRG LVL3 (GOWN DISPOSABLE) ×3
GOWN STRL REUS W/TWL XL LVL3 (GOWN DISPOSABLE)
K-WIRE ORTHOPEDIC 1.4X150L (WIRE) ×4
KWIRE ORTHOPEDIC 1.4X150L (WIRE) ×2 IMPLANT
NEEDLE HYPO 22GX1.5 SAFETY (NEEDLE) IMPLANT
NS IRRIG 1000ML POUR BTL (IV SOLUTION) ×2 IMPLANT
PACK BASIN DAY SURGERY FS (CUSTOM PROCEDURE TRAY) ×2 IMPLANT
PAD CAST 4YDX4 CTTN HI CHSV (CAST SUPPLIES) ×1 IMPLANT
PADDING CAST COTTON 4X4 STRL (CAST SUPPLIES) ×1
PADDING CAST COTTON 6X4 STRL (CAST SUPPLIES) ×2 IMPLANT
PENCIL BUTTON HOLSTER BLD 10FT (ELECTRODE) ×2 IMPLANT
PLATE TUBUAL 1/3 6H (Plate) ×2 IMPLANT
SCREW CANC FT 16X4X2.5XHEX (Screw) ×1 IMPLANT
SCREW CANCELLOUS 4.0X16MM (Screw) ×1 IMPLANT
SCREW CANN 4.0X46MM (Screw) ×2 IMPLANT
SCREW CANN 44X15X4XSLF DRL (Screw) ×2 IMPLANT
SCREW CANNULATED 4.0X44MM (Screw) ×2 IMPLANT
SCREW CORTEX ST MATTA 3.5X12MM (Screw) ×4 IMPLANT
SCREW CORTEX ST MATTA 3.5X14 (Screw) ×2 IMPLANT
SCREW CORTEX ST MATTA 3.5X16MM (Screw) ×2 IMPLANT
SCREW CORTEX ST MATTA 3.5X18MM (Screw) ×4 IMPLANT
SLEEVE SCD COMPRESS KNEE MED (MISCELLANEOUS) IMPLANT
SPLINT FAST PLASTER 5X30 (CAST SUPPLIES)
SPLINT PLASTER CAST FAST 5X30 (CAST SUPPLIES) IMPLANT
SPONGE LAP 4X18 X RAY DECT (DISPOSABLE) ×2 IMPLANT
STRIP CLOSURE SKIN 1/2X4 (GAUZE/BANDAGES/DRESSINGS) ×2 IMPLANT
SUCTION FRAZIER TIP 10 FR DISP (SUCTIONS) IMPLANT
SUT ETHILON 3 0 PS 1 (SUTURE) ×2 IMPLANT
SUT FIBERWIRE #2 38 T-5 BLUE (SUTURE) ×2
SUT MON AB 2-0 CT1 36 (SUTURE) ×4 IMPLANT
SUT MON AB 4-0 PC3 18 (SUTURE) ×2 IMPLANT
SUT VIC AB 0 SH 27 (SUTURE) ×2 IMPLANT
SUT VIC AB 2-0 SH 27 (SUTURE) ×1
SUT VIC AB 2-0 SH 27XBRD (SUTURE) ×1 IMPLANT
SUTURE FIBERWR #2 38 T-5 BLUE (SUTURE) ×1 IMPLANT
SYR BULB 3OZ (MISCELLANEOUS) ×2 IMPLANT
SYRINGE 20CC LL (MISCELLANEOUS) IMPLANT
TOWEL OR 17X24 6PK STRL BLUE (TOWEL DISPOSABLE) ×4 IMPLANT
TOWEL OR NON WOVEN STRL DISP B (DISPOSABLE) ×2 IMPLANT
TUBE CONNECTING 20X1/4 (TUBING) ×2 IMPLANT
UNDERPAD 30X30 INCONTINENT (UNDERPADS AND DIAPERS) ×2 IMPLANT

## 2014-02-10 NOTE — Transfer of Care (Signed)
Immediate Anesthesia Transfer of Care Note  Patient: Brad Pineda.  Procedure(s) Performed: Procedure(s): LEFT ANKLE: FRACTURE OPEN TREATMENT TRIMALLEOLAR ANKLE INCLUDES INTERNAL FIXATION WITH FIXATION OF POSTERIOR LIP (Left)  Patient Location: PACU  Anesthesia Type:GA combined with regional for post-op pain  Level of Consciousness: awake, alert  and patient cooperative  Airway & Oxygen Therapy: Patient Spontanous Breathing and Patient connected to face mask oxygen  Post-op Assessment: Report given to PACU RN, Post -op Vital signs reviewed and stable and Patient moving all extremities  Post vital signs: Reviewed and stable  Complications: No apparent anesthesia complications

## 2014-02-10 NOTE — Discharge Instructions (Signed)
ELEVATE leg with toes above nose as much as possible  No weight on left leg  Regional Anesthesia Blocks  1. Numbness or the inability to move the "blocked" extremity may last from 3-48 hours after placement. The length of time depends on the medication injected and your individual response to the medication. If the numbness is not going away after 48 hours, call your surgeon.  2. The extremity that is blocked will need to be protected until the numbness is gone and the  Strength has returned. Because you cannot feel it, you will need to take extra care to avoid injury. Because it may be weak, you may have difficulty moving it or using it. You may not know what position it is in without looking at it while the block is in effect.  3. For blocks in the legs and feet, returning to weight bearing and walking needs to be done carefully. You will need to wait until the numbness is entirely gone and the strength has returned. You should be able to move your leg and foot normally before you try and bear weight or walk. You will need someone to be with you when you first try to ensure you do not fall and possibly risk injury.  4. Bruising and tenderness at the needle site are common side effects and will resolve in a few days.  5. Persistent numbness or new problems with movement should be communicated to the surgeon or the [place] [phone] [place] [phone]).   Post Anesthesia Home Care Instructions  Activity: Get plenty of rest for the remainder of the day. A responsible adult should stay with you for 24 hours following the procedure.  For the next 24 hours, DO NOT: -Drive a car -Paediatric nurse -Drink alcoholic beverages -Take any medication unless instructed by your physician -Make any legal decisions or sign important papers.  Meals: Start with liquid foods such as gelatin or soup. Progress to regular foods as tolerated. Avoid greasy, spicy,  heavy foods. If nausea and/or vomiting occur, drink only clear liquids until the nausea and/or vomiting subsides. Call your physician if vomiting continues.  Special Instructions/Symptoms: Your throat may feel dry or sore from the anesthesia or the breathing tube placed in your throat during surgery. If this causes discomfort, gargle with warm salt water. The discomfort should disappear within 24 hours.

## 2014-02-10 NOTE — Interval H&P Note (Signed)
History and Physical Interval Note:  02/10/2014 7:51 AM  Brad Pineda.  has presented today for surgery, with the diagnosis of Left Ankle: Fracture Ankle Trimalleolar  The various methods of treatment have been discussed with the patient and family. After consideration of risks, benefits and other options for treatment, the patient has consented to  Procedure(s): LEFT ANKLE: FRACTURE OPEN TREATMENT TRIMALLEOLAR ANKLE INCLUDES INTERNAL FIXATION WITH FIXATION OF POSTERIOR LIP (Left) as a surgical intervention .  The patient's history has been reviewed, patient examined, no change in status, stable for surgery.  I have reviewed the patient's chart and labs.  Questions were answered to the patient's satisfaction.     Kaleigha Chamberlin, D

## 2014-02-10 NOTE — Progress Notes (Signed)
Assisted Dr. Oletta Lamas with left, ultrasound guided, popliteal block. Side rails up, monitors on throughout procedure. See vital signs in flow sheet. Tolerated Procedure well.

## 2014-02-10 NOTE — Anesthesia Procedure Notes (Addendum)
Anesthesia Regional Block:  Popliteal block  Pre-Anesthetic Checklist: ,, timeout performed, Correct Patient, Correct Site, Correct Laterality, Correct Procedure, Correct Position, site marked, Risks and benefits discussed,  Surgical consent,  Pre-op evaluation,  At surgeon's request and post-op pain management  Laterality: Left  Prep: chloraprep       Needles:  Injection technique: Single-shot  Needle Type: Stimulator Needle - 80          Additional Needles:  Procedures: Doppler guided, ultrasound guided (picture in chart) and nerve stimulator Popliteal block  Nerve Stimulator or Paresthesia:  Response: 0.5 mA,   Additional Responses:   Narrative:  Start time: 02/10/2014 9:15 AM End time: 02/10/2014 9:35 AM Injection made incrementally with aspirations every 5 mL.  Performed by: Personally  Anesthesiologist: Dr. Oletta Lamas   Procedure Name: LMA Insertion Date/Time: 02/10/2014 10:42 AM Performed by: Baxter Flattery Pre-anesthesia Checklist: Patient identified, Emergency Drugs available, Suction available and Patient being monitored Patient Re-evaluated:Patient Re-evaluated prior to inductionOxygen Delivery Method: Circle System Utilized Preoxygenation: Pre-oxygenation with 100% oxygen Intubation Type: IV induction Ventilation: Mask ventilation without difficulty LMA: LMA inserted LMA Size: 5.0 Number of attempts: 1 Airway Equipment and Method: bite block Placement Confirmation: positive ETCO2 and breath sounds checked- equal and bilateral Tube secured with: Tape Dental Injury: Teeth and Oropharynx as per pre-operative assessment

## 2014-02-10 NOTE — Anesthesia Preprocedure Evaluation (Signed)
Anesthesia Evaluation  Patient identified by MRN, date of birth, ID band  Reviewed: Allergy & Precautions, H&P , NPO status , Patient's Chart, lab work & pertinent test results  Airway Mallampati: II      Dental   Pulmonary sleep apnea ,          Cardiovascular hypertension,     Neuro/Psych    GI/Hepatic GERD-  ,  Endo/Other  diabetes  Renal/GU      Musculoskeletal   Abdominal   Peds  Hematology   Anesthesia Other Findings   Reproductive/Obstetrics                           Anesthesia Physical Anesthesia Plan  ASA: III  Anesthesia Plan: General   Post-op Pain Management:    Induction: Intravenous  Airway Management Planned: LMA  Additional Equipment:   Intra-op Plan:   Post-operative Plan: Extubation in OR  Informed Consent: I have reviewed the patients History and Physical, chart, labs and discussed the procedure including the risks, benefits and alternatives for the proposed anesthesia with the patient or authorized representative who has indicated his/her understanding and acceptance.   Dental advisory given  Plan Discussed with: CRNA and Anesthesiologist  Anesthesia Plan Comments:         Anesthesia Quick Evaluation

## 2014-02-10 NOTE — Op Note (Signed)
02/10/2014  1:16 PM  PATIENT:  Brad Pineda.    PRE-OPERATIVE DIAGNOSIS:  Left Ankle: Fracture Ankle Trimalleolar  POST-OPERATIVE DIAGNOSIS:  Same  PROCEDURE:  LEFT ANKLE: FRACTURE OPEN TREATMENT TRIMALLEOLAR ANKLE INCLUDES INTERNAL FIXATION WITH FIXATION OF POSTERIOR LIP  SURGEON:  Edmonia Lynch, D, MD  ASSISTANT: Joya Gaskins OPA  ANESTHESIA:   Gen  PREOPERATIVE INDICATIONS:  [name]. is a  38 y.o. male with a diagnosis of Left Ankle: Fracture Ankle Trimalleolar who failed conservative measures and elected for surgical management.    The risks benefits and alternatives were discussed with the patient preoperatively including but not limited to the risks of infection, bleeding, nerve injury, cardiopulmonary complications, the need for revision surgery, among others, and the patient was willing to proceed.  OPERATIVE IMPLANTS: [employer] ankle solutions  OPERATIVE FINDINGS: Unstable ankle fracture. Stable syndesmosis post op  BLOOD LOSS: 40  COMPLICATIONS: none  TOURNIQUET TIME: 137mn  OPERATIVE PROCEDURE:  Patient was identified in the preoperative holding area and site was marked by me He was transported to the operating theater and placed on the table in supine position taking care to pad all bony prominences. After a preincinduction time out anesthesia was induced. The left lower extremity was prepped and draped in normal sterile fashion and a pre-incision timeout was performed. [name] received ancef for preoperative antibiotics.   I made a lateral incision of roughly 7 cm dissection was carried down sharply to the distal fibula and then spreading dissection was used proximally to protect the superficial peroneal nerve. I sharply incised the periosteum and took care to protect the peroneal tendons. I then debrided the fracture site and performed a reduction maneuver which was held in place with a clamp.   I placed a lag screw across each of the fracture  planes  I then selected a 6-hole one third tubular plate and placed in a neutralization fashion care was taken distally so as not to penetrate the joint with the cancellus screws.  I then turned my attention medially where I created a 4 cm incision and dissected sharply down to the medial Mal fracture taking care to protect the saphenous vein. I debrided the fracture and reduced and held in place with a tenaculum. I then drilled and placed 1 partially threaded 45 mm cannulated screw. There was some comminution so to prevent blow out of the medial cortex I places a #2 fiber wire tension band.   I then stressed the syndesmosis and it was stable  I then placed a cobb posterior to the fibular and anterior to the peroneal tendons and reduced the post mal piece percutaneously. It did not have far to go but I also wanted to keep pressure on it while placing the lag screw. I percutaneously placed an A to P cannulated lag screw.   I was happy with the post op films  The wound was then thoroughly irrigated and closed using a 0 Vicryl and absorbable Monocryl sutures. He was placed in a short leg splint.   POST OPERATIVE PLAN: Non-weightbearing. DVT prophylaxis will consist of ASA 81 daily

## 2014-02-10 NOTE — Anesthesia Postprocedure Evaluation (Signed)
  Anesthesia Post-op Note  Patient: Brad Pineda.  Procedure(s) Performed: Procedure(s): LEFT ANKLE: FRACTURE OPEN TREATMENT TRIMALLEOLAR ANKLE INCLUDES INTERNAL FIXATION WITH FIXATION OF POSTERIOR LIP (Left)  Patient Location: PACU  Anesthesia Type:GA combined with regional for post-op pain  Level of Consciousness: awake, alert  and oriented  Airway and Oxygen Therapy: Patient Spontanous Breathing  Post-op Pain: mild  Post-op Assessment: Post-op Vital signs reviewed  Post-op Vital Signs: Reviewed  Last Vitals:  Filed Vitals:   02/10/14 1415  BP: 116/69  Pulse: 101  Temp:   Resp: 12    Complications: No apparent anesthesia complications

## 2014-02-11 ENCOUNTER — Encounter (HOSPITAL_BASED_OUTPATIENT_CLINIC_OR_DEPARTMENT_OTHER): Payer: Self-pay | Admitting: Orthopedic Surgery

## 2014-02-24 ENCOUNTER — Ambulatory Visit: Payer: Managed Care, Other (non HMO) | Admitting: Physician Assistant

## 2014-02-24 ENCOUNTER — Ambulatory Visit: Payer: Managed Care, Other (non HMO) | Admitting: Family Medicine

## 2014-05-29 ENCOUNTER — Other Ambulatory Visit: Payer: Self-pay | Admitting: Physician Assistant

## 2014-06-12 ENCOUNTER — Ambulatory Visit: Payer: Managed Care, Other (non HMO) | Admitting: Nurse Practitioner

## 2014-06-27 ENCOUNTER — Other Ambulatory Visit: Payer: Self-pay | Admitting: Physician Assistant

## 2014-07-27 ENCOUNTER — Other Ambulatory Visit: Payer: Self-pay | Admitting: Physician Assistant

## 2014-08-25 LAB — HM DIABETES EYE EXAM

## 2014-08-26 ENCOUNTER — Encounter: Payer: Self-pay | Admitting: Physician Assistant

## 2014-09-04 ENCOUNTER — Other Ambulatory Visit: Payer: Self-pay | Admitting: Physician Assistant

## 2014-09-07 NOTE — Telephone Encounter (Signed)
Pt has appt tomorrow. RFd x 1 week

## 2014-09-08 ENCOUNTER — Ambulatory Visit (INDEPENDENT_AMBULATORY_CARE_PROVIDER_SITE_OTHER): Payer: Managed Care, Other (non HMO) | Admitting: Physician Assistant

## 2014-09-08 ENCOUNTER — Encounter: Payer: Self-pay | Admitting: Physician Assistant

## 2014-09-08 VITALS — BP 132/81 | HR 92 | Temp 98.6°F | Resp 16 | Ht 68.5 in | Wt 210.0 lb

## 2014-09-08 DIAGNOSIS — IMO0002 Reserved for concepts with insufficient information to code with codable children: Secondary | ICD-10-CM

## 2014-09-08 DIAGNOSIS — Z125 Encounter for screening for malignant neoplasm of prostate: Secondary | ICD-10-CM

## 2014-09-08 DIAGNOSIS — E1165 Type 2 diabetes mellitus with hyperglycemia: Secondary | ICD-10-CM

## 2014-09-08 DIAGNOSIS — E782 Mixed hyperlipidemia: Secondary | ICD-10-CM

## 2014-09-08 DIAGNOSIS — I10 Essential (primary) hypertension: Secondary | ICD-10-CM

## 2014-09-08 DIAGNOSIS — F419 Anxiety disorder, unspecified: Secondary | ICD-10-CM

## 2014-09-08 DIAGNOSIS — Z0000 Encounter for general adult medical examination without abnormal findings: Secondary | ICD-10-CM

## 2014-09-08 DIAGNOSIS — E118 Type 2 diabetes mellitus with unspecified complications: Secondary | ICD-10-CM

## 2014-09-08 LAB — CBC WITH DIFFERENTIAL/PLATELET
Basophils Absolute: 0 10*3/uL (ref 0.0–0.1)
Basophils Relative: 1 % (ref 0–1)
EOS ABS: 0 10*3/uL (ref 0.0–0.7)
EOS PCT: 1 % (ref 0–5)
HEMATOCRIT: 45.1 % (ref 39.0–52.0)
Hemoglobin: 15.6 g/dL (ref 13.0–17.0)
LYMPHS PCT: 48 % — AB (ref 12–46)
Lymphs Abs: 2.3 10*3/uL (ref 0.7–4.0)
MCH: 28.6 pg (ref 26.0–34.0)
MCHC: 34.6 g/dL (ref 30.0–36.0)
MCV: 82.8 fL (ref 78.0–100.0)
MONO ABS: 0.4 10*3/uL (ref 0.1–1.0)
MPV: 9.3 fL (ref 8.6–12.4)
Monocytes Relative: 8 % (ref 3–12)
NEUTROS ABS: 2 10*3/uL (ref 1.7–7.7)
Neutrophils Relative %: 42 % — ABNORMAL LOW (ref 43–77)
PLATELETS: 259 10*3/uL (ref 150–400)
RBC: 5.45 MIL/uL (ref 4.22–5.81)
RDW: 13.7 % (ref 11.5–15.5)
WBC: 4.7 10*3/uL (ref 4.0–10.5)

## 2014-09-08 LAB — LIPID PANEL
Cholesterol: 135 mg/dL (ref 0–200)
HDL: 29 mg/dL — ABNORMAL LOW (ref >39)
LDL CALC: 82 mg/dL (ref 0–99)
Total CHOL/HDL Ratio: 4.7 Ratio
Triglycerides: 122 mg/dL (ref <150)
VLDL: 24 mg/dL (ref 0–40)

## 2014-09-08 LAB — COMPLETE METABOLIC PANEL WITH GFR
ALK PHOS: 47 U/L (ref 39–117)
ALT: 64 U/L — AB (ref 0–53)
AST: 35 U/L (ref 0–37)
Albumin: 4.4 g/dL (ref 3.5–5.2)
BUN: 18 mg/dL (ref 6–23)
CO2: 26 mEq/L (ref 19–32)
CREATININE: 1.19 mg/dL (ref 0.50–1.35)
Calcium: 9.2 mg/dL (ref 8.4–10.5)
Chloride: 103 mEq/L (ref 96–112)
GFR, EST NON AFRICAN AMERICAN: 76 mL/min
GFR, Est African American: 88 mL/min
Glucose, Bld: 209 mg/dL — ABNORMAL HIGH (ref 70–99)
Potassium: 4 mEq/L (ref 3.5–5.3)
Sodium: 138 mEq/L (ref 135–145)
Total Bilirubin: 0.6 mg/dL (ref 0.2–1.2)
Total Protein: 7.1 g/dL (ref 6.0–8.3)

## 2014-09-08 LAB — POCT GLYCOSYLATED HEMOGLOBIN (HGB A1C): HEMOGLOBIN A1C: 9.2

## 2014-09-08 LAB — GLUCOSE, POCT (MANUAL RESULT ENTRY): POC Glucose: 228 mg/dl — AB (ref 70–99)

## 2014-09-08 MED ORDER — FENOFIBRATE 145 MG PO TABS
145.0000 mg | ORAL_TABLET | Freq: Every day | ORAL | Status: DC
Start: 2014-09-08 — End: 2015-11-18

## 2014-09-08 MED ORDER — GLIPIZIDE ER 10 MG PO TB24: 10.0000 mg | ORAL_TABLET | Freq: Every day | ORAL | Status: DC

## 2014-09-08 MED ORDER — PAROXETINE HCL 40 MG PO TABS: 40.0000 mg | ORAL_TABLET | Freq: Every day | ORAL | Status: DC

## 2014-09-08 MED ORDER — SITAGLIPTIN PHOSPHATE 100 MG PO TABS: 100.0000 mg | ORAL_TABLET | Freq: Every day | ORAL | Status: DC

## 2014-09-08 MED ORDER — PRAVASTATIN SODIUM 40 MG PO TABS: 40.0000 mg | ORAL_TABLET | Freq: Every day | ORAL | Status: DC

## 2014-09-08 MED ORDER — ENALAPRIL MALEATE 10 MG PO TABS: 10.0000 mg | ORAL_TABLET | Freq: Every day | ORAL | Status: DC

## 2014-09-08 MED ORDER — GLIPIZIDE ER 5 MG PO TB24: 5.0000 mg | ORAL_TABLET | Freq: Every day | ORAL | Status: DC

## 2014-09-08 NOTE — Progress Notes (Signed)
Subjective:    Patient ID: Brad Pineda., male    DOB: November 21, 1975, 39 y.o.   MRN: [id number]   PCP: Adonai Helzer, PA-C  Chief Complaint  Patient presents with  . Annual Exam    No Known Allergies  Patient Active Problem List   Diagnosis Date Noted  . OSA on CPAP 12/10/2013  . Obesity (BMI 30-39.9) 05/13/2013  . Obstructive sleep apnea 12/10/2012  . Diabetes mellitus type 2, uncontrolled, with complications   . Essential hypertension, benign   . Mixed hyperlipidemia   . GERD (gastroesophageal reflux disease)   . Anxiety     Prior to Admission medications   Medication Sig Start Date End Date Taking? Authorizing Provider  aspirin 81 MG tablet Take 162 mg by mouth once. Takes 3 qd   Yes Historical Provider, MD  cetirizine (ZYRTEC) 10 MG tablet Take 10 mg by mouth daily.   Yes Historical Provider, MD  enalapril (VASOTEC) 10 MG tablet TAKE 1 TABLET (10 MG TOTAL) BY MOUTH DAILY. NO MORE REFILLS WITHOUT OFFICE VISIT - 2ND NOTICE 09/07/14  Yes Thomasa Heidler S Cheril Slattery, PA-C  fenofibrate (TRICOR) 145 MG tablet Take 145 mg by mouth daily.   Yes Historical Provider, MD  fish oil-omega-3 fatty acids 1000 MG capsule Take 1 g by mouth daily.    Yes Historical Provider, MD  glipiZIDE (GLUCOTROL XL) 5 MG 24 hr tablet TAKE ONE TABLET DAILY WITH LARGEST MEAL OF THE DAY.   Yes Mancel Bale, PA-C  hydrochlorothiazide (HYDRODIURIL) 25 MG tablet Take 25 mg by mouth daily.   Yes Historical Provider, MD  ipratropium (ATROVENT) 0.03 % nasal spray Place 2 sprays into the nose 2 (two) times daily. 08/19/13 09/08/14 Yes Pravin Perezperez S Aradhana Gin, PA-C  lansoprazole (PREVACID) 15 MG capsule Take 15 mg by mouth daily.   Yes Historical Provider, MD  losartan (COZAAR) 50 MG tablet Take 50 mg by mouth daily.   Yes Historical Provider, MD  metFORMIN (GLUCOPHAGE) 500 MG tablet Take 1 tablet (500 mg total) by mouth 2 (two) times daily with a meal. 11/25/13  Yes Aeriana Speece S Gayatri Teasdale, PA-C  niacin (NIASPAN) 500 MG CR tablet Take 500  mg by mouth daily.   Yes Historical Provider, MD  PARoxetine (PAXIL) 40 MG tablet Take 40 mg by mouth daily.   Yes Historical Provider, MD  pravastatin (PRAVACHOL) 40 MG tablet Take 1 tablet (40 mg total) by mouth daily. NO MORE REFILLS WITHOUT OFFICE VISIT - 2ND NOTICE 07/28/14  Yes Mancel Bale, PA-C  sitaGLIPtin (JANUVIA) 100 MG tablet Take 1 tablet (100 mg total) by mouth daily. 08/19/13  Yes Giacomo Valone S Matthews Franks, PA-C  ibuprofen (ADVIL,MOTRIN) 200 MG tablet Take 200 mg by mouth every 6 (six) hours as needed.    Historical Provider, MD      Past Medical History  Diagnosis Date  . GERD (gastroesophageal reflux disease)   . Anxiety   . Hyperlipidemia   . Depression   . Sleep apnea     uses CPAP nightly  . Hypertension     under control with meds., has been on med. since 2009  . Diabetes mellitus type 2, noninsulin dependent   . Seasonal allergies   . Trimalleolar fracture of left ankle 02/06/2014  . Family history of anesthesia complication     states father is hard to wake up post-op     Past Surgical History  Procedure Laterality Date  . Orif ankle fracture Left 02/10/2014    Procedure: LEFT ANKLE: FRACTURE  OPEN TREATMENT TRIMALLEOLAR ANKLE INCLUDES INTERNAL FIXATION WITH FIXATION OF POSTERIOR LIP;  Surgeon: Renette Butters, MD;  Location: [facility];  Service: Orthopedics;  Laterality: Left;  . Foot surgery      Family History  Problem Relation Age of Onset  . Hypertension Mother   . Diabetes Mother   . Kidney disease Father   . Anesthesia problems Father     hard to wake up post-op    History   Social History  . Marital Status: Married    Spouse Name: Dorine    Number of Children: 0  . Years of Education: 16   Occupational History  . Hardware Associate [employer]   Social History Main Topics  . Smoking status: Never Smoker   . Smokeless tobacco: Never Used  . Alcohol Use: No  . Drug Use: No  . Sexual Activity:    Partners: Female    Other Topics Concern  . None   Social History Narrative   Patient is married (Dorine) and Lives with his wife.   Patient is working full-time.   Patient has a BA degree.   Patient is right-handed.   Patient drinks two cups of soda and tea occasionally.      HPI  Presents for Annual Wellness Exam. Was due to see me in July for follow-up of DM, lipids, HTN, but broke his ankle, requiring surgery. Has not followed up since then. Prior to that his DM was poorly controlled, HTN was well controlled, and lipids have been good except triglycerides which have been elevated for the past year, though better controlled that 05/2013 when TG 583.  Ran out of enalapril 2 weeks ago.  Frequency of home glucose monitoring: not checking Sees a dentist Q6 months, eye specialist annually (08/2014). Checks feet daily. Is current with influenza vaccine. Is current with pneumococcal vaccine.  He's generally feeling well, without any specific complaints. Tolerating medications without adverse effects.   Review of Systems  Constitutional: Negative.   HENT: Negative.   Eyes: Negative.   Respiratory: Negative.   Cardiovascular: Negative.   Gastrointestinal: Negative.   Endocrine: Negative.   Genitourinary: Negative.   Musculoskeletal: Positive for arthralgias (LEFT ankle remains stiff and painful, but is improving.). Negative for myalgias, back pain, joint swelling, gait problem, neck pain and neck stiffness.  Skin: Negative.   Allergic/Immunologic: Negative.   Neurological: Negative.   Hematological: Negative.   Psychiatric/Behavioral: Negative.        Objective:   Physical Exam  Constitutional: He is oriented to person, place, and time. Vital signs are normal. He appears well-developed and well-nourished. He is active and cooperative.  Non-toxic appearance. He does not have a sickly appearance. He does not appear ill. No distress.  BP 132/81 mmHg  Pulse 92  Temp(Src) 98.6 F (37 C)   Resp 16  Ht 5' 8.5" (1.74 m)  Wt 210 lb (95.255 kg)  BMI 31.46 kg/m2  SpO2 96%   HENT:  Head: Normocephalic and atraumatic.  Right Ear: Hearing, tympanic membrane, external ear and ear canal normal.  Left Ear: Hearing, tympanic membrane, external ear and ear canal normal.  Nose: Nose normal.  Mouth/Throat: Uvula is midline, oropharynx is clear and moist and mucous membranes are normal. He does not have dentures. No oral lesions. No trismus in the jaw. Normal dentition. No dental abscesses, uvula swelling, lacerations or dental caries.  Eyes: Conjunctivae, EOM and lids are normal. Pupils are equal, round, and reactive to light. Right  eye exhibits no discharge. Left eye exhibits no discharge. No scleral icterus.  Fundoscopic exam:      The right eye shows no arteriolar narrowing, no AV nicking, no exudate, no hemorrhage and no papilledema.       The left eye shows no arteriolar narrowing, no AV nicking, no exudate, no hemorrhage and no papilledema.  Neck: Normal range of motion, full passive range of motion without pain and phonation normal. Neck supple. No spinous process tenderness and no muscular tenderness present. No rigidity. No tracheal deviation, no edema, no erythema and normal range of motion present. No thyromegaly present.  Cardiovascular: Normal rate, regular rhythm, S1 normal, S2 normal, normal heart sounds, intact distal pulses and normal pulses.  Exam reveals no gallop and no friction rub.   No murmur heard. Pulmonary/Chest: Effort normal and breath sounds normal. No respiratory distress. He has no wheezes. He has no rales.  Abdominal: Soft. Normal appearance and bowel sounds are normal. He exhibits no distension and no mass. There is no hepatosplenomegaly. There is no tenderness. There is no rebound and no guarding. No hernia. Hernia confirmed negative in the right inguinal area and confirmed negative in the left inguinal area.  Genitourinary: Rectum normal, prostate normal,  testes normal and penis normal. Circumcised. No phimosis, paraphimosis, hypospadias, penile erythema or penile tenderness. No discharge found.  Musculoskeletal: Normal range of motion. He exhibits no edema or tenderness.       Cervical back: Normal. He exhibits normal range of motion, no tenderness, no bony tenderness, no swelling, no edema, no deformity, no laceration, no pain, no spasm and normal pulse.       Thoracic back: Normal.       Lumbar back: Normal.  Lymphadenopathy:       Head (right side): No submental, no submandibular, no tonsillar, no preauricular, no posterior auricular and no occipital adenopathy present.       Head (left side): No submental, no submandibular, no tonsillar, no preauricular, no posterior auricular and no occipital adenopathy present.    He has no cervical adenopathy.       Right: No inguinal and no supraclavicular adenopathy present.       Left: No inguinal and no supraclavicular adenopathy present.  Neurological: He is alert and oriented to person, place, and time. He has normal strength and normal reflexes. He displays no tremor. No cranial nerve deficit. He exhibits normal muscle tone. Coordination and gait normal.  Skin: Skin is warm, dry and intact. No abrasion, no ecchymosis, no laceration, no lesion and no rash noted. He is not diaphoretic. No cyanosis or erythema. No pallor. Nails show no clubbing.  Psychiatric: He has a normal mood and affect. His speech is normal and behavior is normal. Judgment and thought content normal. Cognition and memory are normal.   Diabetic Foot Exam - Simple   Simple Foot Form  Diabetic Foot exam was performed with the following findings:  Yes 09/08/2014 10:55 AM  Visual Inspection  No deformities, no ulcerations, no other skin breakdown bilaterally:  Yes  Sensation Testing  Intact to touch and monofilament testing bilaterally:  Yes  Pulse Check  Posterior Tibialis and Dorsalis pulse intact bilaterally:  Yes  Comments             Assessment & Plan:  1. Annual physical exam Age appropriate anticipatory guidance provided.  2. Diabetes mellitus type 2, uncontrolled, with complications Stressed the importance of improved glucose control. Healthier eating and regular exercise are necessary. Refer  to DM education. Increase glipizide ER from 5 mg to 10 mg. If unable to improve control, plan referral to endocrinology. - POCT glucose (manual entry) - POCT glycosylated hemoglobin (Hb A1C) - Microalbumin, urine - sitaGLIPtin (JANUVIA) 100 MG tablet; Take 1 tablet (100 mg total) by mouth daily.  Dispense: 90 tablet; Refill: 3 - glipiZIDE (GLUCOTROL XL) 10 MG 24 hr tablet; Take 1 tablet (10 mg total) by mouth daily with breakfast.  Dispense: 90 tablet; Refill: 3 - Referral to Nutrition and Diabetes Services  3. Mixed hyperlipidemia Continue current treatment. Needs improved glucose control. Await lipid profile. - Lipid panel - pravastatin (PRAVACHOL) 40 MG tablet; Take 1 tablet (40 mg total) by mouth daily.  Dispense: 90 tablet; Refill: 3 - fenofibrate (TRICOR) 145 MG tablet; Take 1 tablet (145 mg total) by mouth daily.  Dispense: 90 tablet; Refill: 3  4. Essential hypertension, benign Controlled. Continue current treatment. - COMPLETE METABOLIC PANEL WITH GFR - CBC with Differential/Platelet - enalapril (VASOTEC) 10 MG tablet; Take 1 tablet (10 mg total) by mouth daily.  Dispense: 90 tablet; Refill: 3  5. Anxiety Controlled. Continue current treatment. - PARoxetine (PAXIL) 40 MG tablet; Take 1 tablet (40 mg total) by mouth daily.  Dispense: 90 tablet; Refill: 3  6. Screening for prostate cancer Await lab results. - PSA  Return in about 3 months (around 12/07/2014).  Fara Chute, PA-C Physician Assistant-Certified [facility]

## 2014-09-08 NOTE — Patient Instructions (Addendum)
I will contact you with your lab results as soon as they are available.   If you have not heard from me in 2 weeks, please contact me.  The fastest way to get your results is to register for My Chart (see the instructions on the last page of this printout).  Your blood sugar isn't controlled. We'll increase the glipizide to 10 mg each day. In 3 months, if it's still not where we need to be, I will recommend that you see an endocrinologist. In the meantime, I'm referring you to diabetes education to see if that will help, too.    Keeping you healthy  Get these tests  Blood pressure- Have your blood pressure checked once a year by your healthcare provider.  Normal blood pressure is 120/80.  Weight- Have your body mass index (BMI) calculated to screen for obesity.  BMI is a measure of body fat based on height and weight. You can also calculate your own BMI at [employer].it.  Cholesterol- Have your cholesterol checked regularly starting at age 29, sooner may be necessary if you have diabetes, high blood pressure, if a family member developed heart diseases at an early age or if you smoke.   Chlamydia, HIV, and other sexual transmitted disease- Get screened each year until the age of 24 then within three months of each new sexual partner.  Diabetes- Have your blood sugar checked regularly if you have high blood pressure, high cholesterol, a family history of diabetes or if you are overweight.  Get these vaccines  Flu shot- Every fall.  Tetanus shot- Every 10 years.  Menactra- Single dose; prevents meningitis.  Take these steps  Don't smoke- If you do smoke, ask your healthcare provider about quitting. For tips on how to quit, go to www.smokefree.gov or call 1-800-QUIT-NOW.  Be physically active- Exercise 5 days a week for at least 30 minutes.  If you are not already physically active start slow and gradually work up to 30 minutes of moderate physical activity.  Examples of  moderate activity include walking briskly, mowing the yard, dancing, swimming bicycling, etc.  Eat a healthy diet- Eat a variety of healthy foods such as fruits, vegetables, low fat milk, low fat cheese, yogurt, lean meats, poultry, fish, beans, tofu, etc.  For more information on healthy eating, go to www.thenutritionsource.org  Drink alcohol in moderation- Limit alcohol intake two drinks or less a day.  Never drink and drive.  Dentist- Brush and floss teeth twice daily; visit your dentis twice a year.  Depression-Your emotional health is as important as your physical health.  If you're feeling down, losing interest in things you normally enjoy please talk with your healthcare provider.  Gun Safety- If you keep a gun in your home, keep it unloaded and with the safety lock on.  Bullets should be stored separately.  Helmet use- Always wear a helmet when riding a motorcycle, bicycle, rollerblading or skateboarding.  Safe sex- If you may be exposed to a sexually transmitted infection, use a condom  Seat belts- Seat bels can save your life; always wear one.  Smoke/Carbon Monoxide detectors- These detectors need to be installed on the appropriate level of your home.  Replace batteries at least once a year.  Skin Cancer- When out in the sun, cover up and use sunscreen SPF 15 or higher.  Violence- If anyone is threatening or hurting you, please tell your healthcare provider.

## 2014-09-09 LAB — PSA: PSA: 0.27 ng/mL (ref <=4.00)

## 2014-09-10 ENCOUNTER — Telehealth: Payer: Self-pay

## 2014-09-10 ENCOUNTER — Other Ambulatory Visit (INDEPENDENT_AMBULATORY_CARE_PROVIDER_SITE_OTHER): Payer: Managed Care, Other (non HMO)

## 2014-09-10 DIAGNOSIS — E1165 Type 2 diabetes mellitus with hyperglycemia: Secondary | ICD-10-CM

## 2014-09-10 DIAGNOSIS — IMO0002 Reserved for concepts with insufficient information to code with codable children: Secondary | ICD-10-CM

## 2014-09-10 DIAGNOSIS — E118 Type 2 diabetes mellitus with unspecified complications: Secondary | ICD-10-CM

## 2014-09-10 NOTE — Telephone Encounter (Signed)
Urine not sent for microalbumin. Will have pt RTC to give another urine sample.   Spoke with pt. He will come back in tonight for a lab only no charge to give a urine sample--future order placed

## 2014-09-11 LAB — MICROALBUMIN, URINE: MICROALB UR: 2.5 mg/dL — AB (ref <2.0)

## 2014-09-11 NOTE — Telephone Encounter (Signed)
Noted! Thank you

## 2014-09-13 ENCOUNTER — Encounter: Payer: Self-pay | Admitting: Physician Assistant

## 2014-09-15 ENCOUNTER — Other Ambulatory Visit: Payer: Self-pay | Admitting: Physician Assistant

## 2014-11-19 ENCOUNTER — Other Ambulatory Visit: Payer: Self-pay | Admitting: Physician Assistant

## 2014-12-08 ENCOUNTER — Encounter: Payer: Self-pay | Admitting: Physician Assistant

## 2014-12-08 ENCOUNTER — Ambulatory Visit (INDEPENDENT_AMBULATORY_CARE_PROVIDER_SITE_OTHER): Payer: Managed Care, Other (non HMO) | Admitting: Physician Assistant

## 2014-12-08 VITALS — BP 122/80 | HR 91 | Temp 98.3°F | Resp 16 | Ht 68.5 in | Wt 210.6 lb

## 2014-12-08 DIAGNOSIS — IMO0002 Reserved for concepts with insufficient information to code with codable children: Secondary | ICD-10-CM

## 2014-12-08 DIAGNOSIS — E118 Type 2 diabetes mellitus with unspecified complications: Secondary | ICD-10-CM

## 2014-12-08 DIAGNOSIS — E782 Mixed hyperlipidemia: Secondary | ICD-10-CM

## 2014-12-08 DIAGNOSIS — I10 Essential (primary) hypertension: Secondary | ICD-10-CM | POA: Diagnosis not present

## 2014-12-08 DIAGNOSIS — J309 Allergic rhinitis, unspecified: Secondary | ICD-10-CM

## 2014-12-08 DIAGNOSIS — G4733 Obstructive sleep apnea (adult) (pediatric): Secondary | ICD-10-CM | POA: Diagnosis not present

## 2014-12-08 DIAGNOSIS — Z114 Encounter for screening for human immunodeficiency virus [HIV]: Secondary | ICD-10-CM

## 2014-12-08 DIAGNOSIS — E669 Obesity, unspecified: Secondary | ICD-10-CM | POA: Diagnosis not present

## 2014-12-08 DIAGNOSIS — E1165 Type 2 diabetes mellitus with hyperglycemia: Secondary | ICD-10-CM

## 2014-12-08 MED ORDER — NIACIN ER (ANTIHYPERLIPIDEMIC) 500 MG PO TBCR: 500.0000 mg | EXTENDED_RELEASE_TABLET | Freq: Every day | ORAL | Status: DC

## 2014-12-08 MED ORDER — IPRATROPIUM BROMIDE 0.03 % NA SOLN: 2.0000 | Freq: Two times a day (BID) | NASAL | Status: DC

## 2014-12-08 MED ORDER — METFORMIN HCL 500 MG PO TABS: 500.0000 mg | ORAL_TABLET | Freq: Two times a day (BID) | ORAL | Status: DC

## 2014-12-08 MED ORDER — LOSARTAN POTASSIUM 50 MG PO TABS: 50.0000 mg | ORAL_TABLET | Freq: Every day | ORAL | Status: DC

## 2014-12-08 MED ORDER — HYDROCHLOROTHIAZIDE 25 MG PO TABS
25.0000 mg | ORAL_TABLET | Freq: Every day | ORAL | Status: DC
Start: 2014-12-08 — End: 2015-01-27

## 2014-12-08 NOTE — Progress Notes (Signed)
Subjective:    Patient ID: Brad Caldron., male    DOB: Dec 17, 1975, 39 y.o.   MRN: [id number]  HPI  Brad Pineda is a 39 year old male who presents today for 3 month follow up of diabetes, hypertension, and hyperlipidemia.  He does not regularly check his blood sugar at home. He notes that he will occasionally feel lightheaded and weak if he doesn't eat a snack during the work day, but after he eats a snack he is fine. Snacks usually consist of grapes or strawberries. 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 Denies any syncopal episodes. Does perform regular foot exams. Does note increased frequency urinating during the night. Denies any burning, urgency, or hematuria.   Does not regularly check blood pressure at home. Denies any headaches, dizziness, chest pain, palpitations. He also does not adhere to a healthy diet or exercise regimen. Typically will eat fast-food or something quick for lunch and dinner. Does not regularly cook meals; "I'm just too lazy." The exercise he gets is from walking all day at work. He does not do any additional exercise.  He also does not regularly use his CPAP machine to sleep at night. He stopped wearing it last summer when he broke his ankle, and tried one time to start wearing it again, but doesn't like it. He knows he should use it at night, but doesn't like how it feels. He does not sleep through the night, partially due to waking up to urinate.   He is also in need of medication refills.   Review of Systems  Constitutional: Negative for fever, chills, diaphoresis and fatigue.  HENT: Negative.   Respiratory: Negative.  Negative for cough and shortness of breath.   Cardiovascular: Negative for chest pain and palpitations.  Gastrointestinal: Negative for nausea, vomiting, abdominal pain, diarrhea and constipation.  Endocrine: Positive for polyuria.    Genitourinary: Negative for dysuria, urgency and hematuria.  Musculoskeletal: Negative.   Neurological: Negative for dizziness, weakness, light-headedness and headaches.       Objective:   Physical Exam  Constitutional: He is oriented to person, place, and time. He appears well-developed and well-nourished. No distress.  BP 122/80 mmHg  Pulse 91  Temp(Src) 98.3 F (36.8 C) (Oral)  Resp 16  Ht 5' 8.5" (1.74 m)  Wt 210 lb 9.6 oz (95.528 kg)  BMI 31.55 kg/m2  SpO2 97%  HENT:  Head: Normocephalic and atraumatic.  Right Ear: Hearing, tympanic membrane, external ear and ear canal normal.  Left Ear: Hearing, tympanic membrane, external ear and ear canal normal.  Nose: Nose normal.  Mouth/Throat: Uvula is midline, oropharynx is clear and moist and mucous membranes are normal. No oropharyngeal exudate, posterior oropharyngeal edema or posterior oropharyngeal erythema.  Eyes: Conjunctivae are normal. Pupils are equal, round, and reactive to light. No scleral icterus.  Neck: Normal range of motion. Neck supple.  Cardiovascular: Normal rate, regular rhythm, S1 normal, S2 normal, normal heart sounds and intact distal pulses.  Exam reveals no gallop and no friction rub.   No murmur heard. Pulses:      Dorsalis pedis pulses are 2+ on the right side, and 2+ on the left side.       Posterior tibial pulses are 2+ on the right side, and 2+ on the left side.  Pulmonary/Chest: Effort normal and breath sounds normal. He has no wheezes. He  has no rhonchi. He has no rales.  Lymphadenopathy:       Head (right side): No submental, no submandibular and no tonsillar adenopathy present.       Head (left side): No submental, no submandibular and no tonsillar adenopathy present.    He has no cervical adenopathy.       Right: No supraclavicular adenopathy present.       Left: No supraclavicular adenopathy present.  Neurological: He is alert and oriented to person, place, and time.  Skin: Skin is warm and  dry.  Psychiatric: He has a normal mood and affect. His behavior is normal.      Assessment & Plan:  1. Diabetes mellitus type 2, uncontrolled, with complications Discussed in detail with patient the importance of adhered to a healthy diet and exercise in order to reduce the complications from diabetes. He says he is "too lazy" to take care of himself, but knows what he needs to do. Discussed having him follow-up more frequently to help with monitoring of his diet and exercise. - HM Diabetes Foot Exam - hydrochlorothiazide (HYDRODIURIL) 25 MG tablet; Take 1 tablet (25 mg total) by mouth daily.  Dispense: 90 tablet; Refill: 3 - metFORMIN (GLUCOPHAGE) 500 MG tablet; Take 1 tablet (500 mg total) by mouth 2 (two) times daily with a meal.  Dispense: 180 tablet; Refill: 3 - POCT glycosylated hemoglobin (Hb A1C) - POCT glucose (manual entry) - Comprehensive metabolic panel - Microalbumin, urine  2. Essential hypertension, benign Stable, well-controlled.  - losartan (COZAAR) 50 MG tablet; Take 1 tablet (50 mg total) by mouth daily.  Dispense: 90 tablet; Refill: 3  3. Mixed hyperlipidemia Stable. Recheck lipid levels. - Lipid panel  4. Obesity (BMI 30-39.9) Discussed the importance of diet, exercise and weight loss in regards to his overall health.  - niacin (NIASPAN) 500 MG CR tablet; Take 1 tablet (500 mg total) by mouth daily.  Dispense: 90 tablet; Refill: 3  5. Screening for HIV (human immunodeficiency virus) - HIV antibody  6. Allergic rhinitis, unspecified allergic rhinitis type Stable. No complaints at this time, just needed a refill to keep his symptoms subsided.  - ipratropium (ATROVENT) 0.03 % nasal spray; Place 2 sprays into both nostrils every 12 (twelve) hours.  Dispense: 30 mL; Refill: 12  7. Obstructive sleep apnea Discussed the importance of wearing his CPAP machine at night. He doesn't like wearing the mask, but knows that he needs to start wearing it again.

## 2014-12-08 NOTE — Patient Instructions (Signed)
Focus on healthier meal options. Check your blood sugar in regards to eating strawberries versus bugles to see which one you can tolerate better.  Start wearing your CPAP at night to help reduce fatigue and tiredness during the day.

## 2014-12-09 LAB — MICROALBUMIN, URINE: Microalb, Ur: 2.7 mg/dL — ABNORMAL HIGH (ref <2.0)

## 2014-12-10 NOTE — Progress Notes (Signed)
Patient ID: Brad Pineda., male    DOB: 06/15/1976, 39 y.o.   MRN: [id number]  PCP: Virgen Belland, PA-C  Subjective:   Chief Complaint  Patient presents with  . Hypertension  . Diabetes  . Hyperlipidemia    HPI Presents for evaluation of diabetes, hypertension, and hyperlipidemia.   He does not regularly check his blood sugar at home. He notes that he will occasionally feel lightheaded and weak if he doesn't eat a snack during the work day, but after he eats a snack he is fine. Snacks usually consist of grapes or strawberries. 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 Denies any syncopal episodes. Does perform regular foot exams. Does note increased frequency urinating during the night. Denies any burning, urgency, or hematuria.  Does not regularly check blood pressure at home. Denies any headaches, dizziness, chest pain, palpitations. He also does not adhere to a healthy diet or exercise regimen. Typically will eat fast-food or something quick for lunch and dinner. Does not regularly cook meals; "I'm just too lazy." The exercise he gets is from walking all day at work. He does not do any additional exercise.   He also does not regularly use his CPAP machine to sleep at night. He stopped wearing it last summer when he broke his ankle, and tried one time to start wearing it again, but doesn't like it. He knows he should use it at night, but doesn't like how it feels. He does not sleep through the night, partially due to waking up to urinate.   He is also in need of medication refills.    Review of Systems Constitutional: Negative for fever, chills, diaphoresis and fatigue.  HENT: Negative.  Respiratory: Negative. Negative for cough and shortness of breath.  Cardiovascular: Negative for chest pain and palpitations.  Gastrointestinal: Negative for nausea, vomiting, abdominal  pain, diarrhea and constipation.  Endocrine: Positive for polyuria.  Genitourinary: Negative for dysuria, urgency and hematuria.  Musculoskeletal: Negative.  Neurological: Negative for dizziness, weakness, light-headedness and headaches.      Patient Active Problem List   Diagnosis Date Noted  . OSA on CPAP 12/10/2013  . Obesity (BMI 30-39.9) 05/13/2013  . Obstructive sleep apnea 12/10/2012  . Diabetes mellitus type 2, uncontrolled, with complications   . Essential hypertension, benign   . Mixed hyperlipidemia   . GERD (gastroesophageal reflux disease)   . Anxiety      Prior to Admission medications   Medication Sig Start Date End Date Taking? Authorizing Provider  aspirin 81 MG tablet Take 162 mg by mouth once. Takes 3 qd   Yes Historical Provider, MD  cetirizine (ZYRTEC) 10 MG tablet Take 10 mg by mouth daily.   Yes Historical Provider, MD  enalapril (VASOTEC) 10 MG tablet Take 1 tablet (10 mg total) by mouth daily. 09/08/14  Yes Squire Withey S Alasdair Kleve, PA-C  fenofibrate (TRICOR) 145 MG tablet Take 1 tablet (145 mg total) by mouth daily. 09/08/14  Yes Saki Legore S Jaquavion Mccannon, PA-C  fish oil-omega-3 fatty acids 1000 MG capsule Take 1 g by mouth daily.    Yes Historical Provider, MD  glipiZIDE (GLUCOTROL XL) 10 MG 24 hr tablet Take 1 tablet (10 mg total) by mouth daily with breakfast. 09/08/14  Yes Rayneisha Bouza S Nakai Pollio, PA-C  hydrochlorothiazide (HYDRODIURIL) 25 MG tablet Take 1 tablet (25 mg total) by mouth daily. 12/08/14  Yes Scarlet Abad S  Jacqulynn Cadet, PA-C  ibuprofen (ADVIL,MOTRIN) 200 MG tablet Take 200 mg by mouth every 6 (six) hours as needed.   Yes Historical Provider, MD  ipratropium (ATROVENT) 0.03 % nasal spray Place 2 sprays into both nostrils every 12 (twelve) hours. 12/08/14  Yes Marvelene Stoneberg S Travanti Mcmanus, PA-C  lansoprazole (PREVACID) 15 MG capsule Take 15 mg by mouth daily.   Yes Historical Provider, MD  losartan (COZAAR) 50 MG tablet Take 1 tablet (50 mg total) by mouth daily. 12/08/14  Yes Darreon Lutes S Keani Gotcher,  PA-C  metFORMIN (GLUCOPHAGE) 500 MG tablet Take 1 tablet (500 mg total) by mouth 2 (two) times daily with a meal. 12/08/14  Yes Donae Kueker S Steffanie Mingle, PA-C  niacin (NIASPAN) 500 MG CR tablet Take 1 tablet (500 mg total) by mouth daily. 12/08/14  Yes Crescent Gotham S Lloyde Ludlam, PA-C  PARoxetine (PAXIL) 40 MG tablet Take 1 tablet (40 mg total) by mouth daily. 09/08/14  Yes Reese Stockman S Avary Eichenberger, PA-C  pravastatin (PRAVACHOL) 40 MG tablet Take 1 tablet (40 mg total) by mouth daily. 09/08/14  Yes Fatina Sprankle S Agnes Probert, PA-C  sitaGLIPtin (JANUVIA) 100 MG tablet Take 1 tablet (100 mg total) by mouth daily. 09/08/14  Yes Mc Hollen S Abbe Bula, PA-C     No Known Allergies     Objective:  Physical Exam  Constitutional: He is oriented to person, place, and time. Vital signs are normal. He appears well-developed and well-nourished. He is active and cooperative. No distress.  BP 122/80 mmHg  Pulse 91  Temp(Src) 98.3 F (36.8 C) (Oral)  Resp 16  Ht 5' 8.5" (1.74 m)  Wt 210 lb 9.6 oz (95.528 kg)  BMI 31.55 kg/m2  SpO2 97%  HENT:  Head: Normocephalic and atraumatic.  Right Ear: Hearing normal.  Left Ear: Hearing normal.  Eyes: Conjunctivae are normal. No scleral icterus.  Neck: Normal range of motion. Neck supple. No thyromegaly present.  Cardiovascular: Normal rate, regular rhythm and normal heart sounds.   Pulses:      Radial pulses are 2+ on the right side, and 2+ on the left side.  Pulmonary/Chest: Effort normal and breath sounds normal.  Lymphadenopathy:       Head (right side): No tonsillar, no preauricular, no posterior auricular and no occipital adenopathy present.       Head (left side): No tonsillar, no preauricular, no posterior auricular and no occipital adenopathy present.    He has no cervical adenopathy.       Right: No supraclavicular adenopathy present.       Left: No supraclavicular adenopathy present.  Neurological: He is alert and oriented to person, place, and time. No sensory deficit.  Skin: Skin is warm,  dry and intact. No rash noted. No cyanosis or erythema. Nails show no clubbing.  Psychiatric: He has a normal mood and affect. His speech is normal and behavior is normal.           Assessment & Plan:   1. Diabetes mellitus type 2, uncontrolled, with complications Uncontrolled. Unmotivated to take care of himself. Says that the knows what to do, just isn't doing it. Declines referral to diabetes education, stating it would be a waste of time and money. Encouraged him to think of ways that I can be helpful to him in becoming more motivated. - HM Diabetes Foot Exam - hydrochlorothiazide (HYDRODIURIL) 25 MG tablet; Take 1 tablet (25 mg total) by mouth daily.  Dispense: 90 tablet; Refill: 3 - metFORMIN (GLUCOPHAGE) 500 MG tablet; Take 1 tablet (500 mg total) by  mouth 2 (two) times daily with a meal.  Dispense: 180 tablet; Refill: 3 - POCT glycosylated hemoglobin (Hb A1C) - POCT glucose (manual entry) - Comprehensive metabolic panel - Microalbumin, urine  2. Essential hypertension, benign Controlled. Continue current treatment. - losartan (COZAAR) 50 MG tablet; Take 1 tablet (50 mg total) by mouth daily.  Dispense: 90 tablet; Refill: 3  3. Mixed hyperlipidemia Await labs. Adjust regimen if needed. - Lipid panel - niacin (NIASPAN) 500 MG CR tablet; Take 1 tablet (500 mg total) by mouth daily.  Dispense: 90 tablet; Refill: 3  4. Obesity (BMI 30-39.9) See above.  5. Screening for HIV (human immunodeficiency virus) - HIV antibody  6. Allergic rhinitis, unspecified allergic rhinitis type Stable - ipratropium (ATROVENT) 0.03 % nasal spray; Place 2 sprays into both nostrils every 12 (twelve) hours.  Dispense: 30 mL; Refill: 12  7. Obstructive sleep apnea Resume CPAP.  Return in about 2 months (around 02/07/2015) for follow-up healthy lifestyle changes.   Fara Chute, PA-C Physician Assistant-Certified [facility] .

## 2014-12-17 ENCOUNTER — Ambulatory Visit (INDEPENDENT_AMBULATORY_CARE_PROVIDER_SITE_OTHER): Payer: Managed Care, Other (non HMO) | Admitting: Physician Assistant

## 2014-12-17 VITALS — BP 110/64 | HR 96 | Temp 97.4°F | Resp 16 | Ht 68.0 in | Wt 210.0 lb

## 2014-12-17 DIAGNOSIS — R0981 Nasal congestion: Secondary | ICD-10-CM

## 2014-12-17 DIAGNOSIS — J029 Acute pharyngitis, unspecified: Secondary | ICD-10-CM | POA: Diagnosis not present

## 2014-12-17 DIAGNOSIS — R05 Cough: Secondary | ICD-10-CM

## 2014-12-17 DIAGNOSIS — R059 Cough, unspecified: Secondary | ICD-10-CM

## 2014-12-17 MED ORDER — BENZONATATE 100 MG PO CAPS: 100.0000 mg | ORAL_CAPSULE | Freq: Three times a day (TID) | ORAL | Status: DC | PRN

## 2014-12-17 NOTE — Patient Instructions (Signed)
I think you have a virus or allergies causing your symptoms.  Mucinex-d every day for next few days with lots of water should help with the congestion.  For the sore throat --> tylenol as needed, lozenges, humidifier at night may all help.  Get plenty of rest and hydration important.  Please take the tessalon twice daily as needed for the cough.  For your allergies, continue with the zyrtec and flonase.

## 2014-12-17 NOTE — Progress Notes (Signed)
   Subjective:    Patient ID: Brad Lopezmartinez., male    DOB: [birth year], 39 y.o.   MRN: [id number]  Chief Complaint  Patient presents with  . Sore Throat  . COugh    Brown. Productive, Causes chest pain  . Nasal Congestion  . Headache    Describes pain as pressure  . Fatigue   Patient Active Problem List   Diagnosis Date Noted  . OSA on CPAP 12/10/2013  . Obesity (BMI 30-39.9) 05/13/2013  . Diabetes mellitus type 2, uncontrolled, with complications   . Essential hypertension, benign   . Mixed hyperlipidemia   . GERD (gastroesophageal reflux disease)   . Anxiety    Medications, allergies, past medical history, surgical history, family history, social history and problem list reviewed and updated.  HPI  55 yom presents with st, cough, nasal congestion, head pressure.   Sx started few days ago with nasal congestion, scratchy throat, mild head pressure. Ears feels congested. Yest and today has had mild cough which has been prod 1-2 times with brownish sputum that he thinks was actually saliva. Pressure behind eyes intermittent past couple days. No assoc vision changes. No fevers, chills.   Fatigue past couple days. No tick exposures. No rashes. Denies abd pain, n/v, diarrhea, cp, sob.   Has allergic rhinitis. Takes zyrtec qd. Nasal steroid most days.   Review of Systems See HPI.     Objective:   Physical Exam  Constitutional: He appears well-developed and well-nourished.  Non-toxic appearance. He does not have a sickly appearance. He does not appear ill. No distress.  BP 110/64 mmHg  Pulse 96  Temp(Src) 97.4 F (36.3 C) (Oral)  Resp 16  Ht 5' 8"  (1.727 m)  Wt 210 lb (95.255 kg)  BMI 31.94 kg/m2  SpO2 97%   HENT:  Right Ear: A middle ear effusion is present.  Left Ear: A middle ear effusion is present.  Nose: Mucosal edema and rhinorrhea present. Right sinus exhibits no maxillary sinus tenderness and no frontal sinus tenderness. Left sinus exhibits no maxillary sinus  tenderness and no frontal sinus tenderness.  Mouth/Throat: Uvula is midline, oropharynx is clear and moist and mucous membranes are normal. No oropharyngeal exudate, posterior oropharyngeal edema, posterior oropharyngeal erythema or tonsillar abscesses.  75% cerumen impaction bilaterally.   Neck: No Brudzinski's sign noted.  Pulmonary/Chest: Effort normal. No tachypnea. He has no decreased breath sounds. He has no wheezes. He has no rhonchi. He has no rales.  Lymphadenopathy:       Head (right side): Submandibular adenopathy present. No submental and no tonsillar adenopathy present.       Head (left side): Submandibular adenopathy present. No submental and no tonsillar adenopathy present.    He has no cervical adenopathy.      Assessment & Plan:   76 yom presents with st, cough, nasal congestion, head pressure.   Sore throat Cough - Plan: benzonatate (TESSALON) 100 MG capsule Head congestion --doubt bacterial etiology with 3 day sx, no fevers, no sinus ttp, normal exam --likely viral/allergic --> mucinex, tylenol prn, lozenges, humidifier at night, rest, hydration, continue zyrtec/nasal steroid --tessalon as needed for cough --let us know if not improved one week  Julieta Gutting, PA-C Physician Assistant-Certified [facility]  12/17/2014 7:19 PM

## 2015-01-20 ENCOUNTER — Other Ambulatory Visit: Payer: Self-pay | Admitting: Physician Assistant

## 2015-01-27 ENCOUNTER — Other Ambulatory Visit: Payer: Self-pay | Admitting: Physician Assistant

## 2015-02-09 ENCOUNTER — Ambulatory Visit (INDEPENDENT_AMBULATORY_CARE_PROVIDER_SITE_OTHER): Payer: Managed Care, Other (non HMO) | Admitting: Physician Assistant

## 2015-02-09 ENCOUNTER — Encounter: Payer: Self-pay | Admitting: Physician Assistant

## 2015-02-09 VITALS — BP 104/70 | HR 90 | Temp 98.8°F | Resp 16 | Ht 68.0 in | Wt 206.4 lb

## 2015-02-09 DIAGNOSIS — E782 Mixed hyperlipidemia: Secondary | ICD-10-CM

## 2015-02-09 DIAGNOSIS — E118 Type 2 diabetes mellitus with unspecified complications: Secondary | ICD-10-CM | POA: Diagnosis not present

## 2015-02-09 DIAGNOSIS — Z114 Encounter for screening for human immunodeficiency virus [HIV]: Secondary | ICD-10-CM

## 2015-02-09 DIAGNOSIS — E1165 Type 2 diabetes mellitus with hyperglycemia: Secondary | ICD-10-CM

## 2015-02-09 DIAGNOSIS — I10 Essential (primary) hypertension: Secondary | ICD-10-CM | POA: Diagnosis not present

## 2015-02-09 DIAGNOSIS — IMO0002 Reserved for concepts with insufficient information to code with codable children: Secondary | ICD-10-CM

## 2015-02-09 LAB — LIPID PANEL
CHOL/HDL RATIO: 5.5 ratio
Cholesterol: 121 mg/dL (ref 0–200)
HDL: 22 mg/dL — AB (ref >=40)
LDL CALC: 71 mg/dL (ref 0–99)
Triglycerides: 142 mg/dL (ref <150)
VLDL: 28 mg/dL (ref 0–40)

## 2015-02-09 LAB — GLUCOSE, POCT (MANUAL RESULT ENTRY): POC GLUCOSE: 212 mg/dL — AB (ref 70–99)

## 2015-02-09 LAB — POCT GLYCOSYLATED HEMOGLOBIN (HGB A1C): HEMOGLOBIN A1C: 8.3

## 2015-02-09 LAB — HIV ANTIBODY (ROUTINE TESTING W REFLEX): HIV 1&2 Ab, 4th Generation: NONREACTIVE

## 2015-02-09 MED ORDER — METFORMIN HCL 500 MG PO TABS: 500.0000 mg | ORAL_TABLET | Freq: Three times a day (TID) | ORAL | Status: DC

## 2015-02-09 NOTE — Progress Notes (Signed)
Patient ID: Brad Kurtzman., male    DOB: 09/28/1975, 39 y.o.   MRN: [id number]  PCP: JEFFERY,CHELLE, PA-C   Subjective:  HPI Pt is a 39 y/o male presenting to clinic for f/u for diabetes, hyperlipidemia, and HTN.  Pt states that he feels like his sugars are more controlled, however, he states that he is "too lazy" to check his glucose at home. He does perform a self foot exam at home every couple of days. He is currently being treated with metformin, glipizide, and januvia, and tolerating all of the medications well, without ADRs. He says that he used to notice that he would get dizzy if he hadn't eaten in 2 hours, but that has gotten better since his last visit. He notes polyuria, but denies polydipsia, polyphagia, dizziness, syncopal episodes, and numbness/tingling in his feet. Last saw Ophthalmologist in February 2016.   In regards to his BP, pt states, "as far as I know it's been fine". He does not monitor his BP at home. He is taking his medications at home as prescribed and tolerating them well. Denies HA, dizziness, chest pain or palpitations, and SOB.   Pt states that he has been working on trying to get more regular exercise. He walks about 4-5 loops around his apartment complex a few times a week. He has also tried to cut back on eating sweets and tried eating salad more often. He states that he thinks he has lost some weight since he has made these changes. On review of his past visits, he has lost 4 pounds since Dec 08, 2014.   Review of Systems  Constitutional: Negative.   Eyes: Negative for photophobia and visual disturbance.  Respiratory: Negative.   Cardiovascular: Negative.   Gastrointestinal: Negative.   Endocrine: Positive for polyuria. Negative for cold intolerance, heat intolerance, polydipsia and polyphagia.  Genitourinary: Positive for frequency. Negative for dysuria, urgency, hematuria, flank pain, enuresis and difficulty urinating.  Musculoskeletal: Negative.     Skin: Negative.   Neurological: Negative.      Patient Active Problem List   Diagnosis Date Noted  . OSA on CPAP 12/10/2013  . Obesity (BMI 30-39.9) 05/13/2013  . Diabetes mellitus type 2, uncontrolled, with complications   . Essential hypertension, benign   . Mixed hyperlipidemia   . GERD (gastroesophageal reflux disease)   . Anxiety     Past Medical History  Diagnosis Date  . GERD (gastroesophageal reflux disease)   . Anxiety   . Hyperlipidemia   . Depression   . Sleep apnea     uses CPAP nightly  . Hypertension     under control with meds., has been on med. since 2009  . Diabetes mellitus type 2, noninsulin dependent   . Seasonal allergies   . Trimalleolar fracture of left ankle 02/06/2014  . Family history of anesthesia complication     states father is hard to wake up post-op    Prior to Admission medications   Medication Sig Start Date End Date Taking? Authorizing Provider  aspirin 81 MG tablet Take 162 mg by mouth once. Takes 3 qd   Yes Historical Provider, MD  cetirizine (ZYRTEC) 10 MG tablet Take 10 mg by mouth daily.   Yes Historical Provider, MD  enalapril (VASOTEC) 10 MG tablet Take 1 tablet (10 mg total) by mouth daily. 09/08/14  Yes Chelle Jeffery, PA-C  fenofibrate (TRICOR) 145 MG tablet Take 1 tablet (145 mg total) by mouth daily. 09/08/14  Yes Harrison Mons, PA-C  fish oil-omega-3 fatty acids 1000 MG capsule Take 1 g by mouth daily.    Yes Historical Provider, MD  glipiZIDE (GLUCOTROL XL) 10 MG 24 hr tablet Take 1 tablet (10 mg total) by mouth daily with breakfast. 09/08/14  Yes Chelle Jeffery, PA-C  hydrochlorothiazide (HYDRODIURIL) 25 MG tablet TAKE 1 TABLET (25 MG TOTAL) BY MOUTH DAILY. 01/27/15  Yes Chelle Jeffery, PA-C  ibuprofen (ADVIL,MOTRIN) 200 MG tablet Take 200 mg by mouth every 6 (six) hours as needed.   Yes Historical Provider, MD  ipratropium (ATROVENT) 0.03 % nasal spray Place 2 sprays into both nostrils every 12 (twelve) hours. 12/08/14  Yes Chelle  Jeffery, PA-C  lansoprazole (PREVACID) 15 MG capsule Take 15 mg by mouth daily.   Yes Historical Provider, MD  losartan (COZAAR) 50 MG tablet Take 1 tablet (50 mg total) by mouth daily. 12/08/14  Yes Chelle Jeffery, PA-C  metFORMIN (GLUCOPHAGE) 500 MG tablet Take 1 tablet (500 mg total) by mouth 2 (two) times daily with a meal. 12/08/14  Yes Chelle Jeffery, PA-C  niacin (NIASPAN) 500 MG CR tablet Take 1 tablet (500 mg total) by mouth daily. 12/08/14  Yes Chelle Jeffery, PA-C  PARoxetine (PAXIL) 40 MG tablet Take 1 tablet (40 mg total) by mouth daily. 09/08/14  Yes Chelle Jeffery, PA-C  pravastatin (PRAVACHOL) 40 MG tablet Take 1 tablet (40 mg total) by mouth daily. 09/08/14  Yes Chelle Jeffery, PA-C  sitaGLIPtin (JANUVIA) 100 MG tablet Take 1 tablet (100 mg total) by mouth daily. 09/08/14  Yes Harrison Mons, PA-C    No Known Allergies  Past Medical, Surgical Family and Social History reviewed and updated.   Objective:   Vitals: BP 104/70 mmHg  Pulse 90  Temp(Src) 98.8 F (37.1 C) (Oral)  Resp 16  Ht 5' 8"  (1.727 m)  Wt 206 lb 6.4 oz (93.622 kg)  BMI 31.39 kg/m2  SpO2 98%   Physical Exam  Constitutional: He is oriented to person, place, and time. He appears well-developed and well-nourished. He is cooperative. No distress.  HENT:  Head: Normocephalic and atraumatic.  Right Ear: External ear normal.  Left Ear: External ear normal.  Eyes: Conjunctivae, EOM and lids are normal. Pupils are equal, round, and reactive to light.  Fundoscopic exam:      The right eye shows no exudate, no hemorrhage and no papilledema. The right eye shows red reflex.       The left eye shows no exudate, no hemorrhage and no papilledema. The left eye shows red reflex.  Neck: Neck supple. No thyroid mass and no thyromegaly present.  Cardiovascular: Normal rate, regular rhythm, normal heart sounds and intact distal pulses.  Exam reveals no gallop and no friction rub.   No murmur heard. Pulmonary/Chest: Effort normal  and breath sounds normal. No respiratory distress. He has no wheezes. He has no rales.  Abdominal: Soft. Bowel sounds are normal. He exhibits no distension. There is no tenderness. There is no CVA tenderness.  Lymphadenopathy:       Head (right side): No submental, no submandibular, no tonsillar, no preauricular, no posterior auricular and no occipital adenopathy present.       Head (left side): No submental, no submandibular, no tonsillar, no preauricular, no posterior auricular and no occipital adenopathy present.    He has no cervical adenopathy.       Right: No supraclavicular adenopathy present.       Left: No supraclavicular adenopathy present.  Neurological: He is alert and oriented to person,  place, and time. He has normal strength and normal reflexes. No sensory deficit.  Skin: Skin is warm, dry and intact. No rash noted.  Psychiatric: He has a normal mood and affect. His behavior is normal. Judgment and thought content normal.    Results for orders placed or performed in visit on 02/09/15  POCT glucose (manual entry)  Result Value Ref Range   POC Glucose 212 (A) 70 - 99 mg/dl  POCT glycosylated hemoglobin (Hb A1C)  Result Value Ref Range   Hemoglobin A1C 8.3      Assessment & Plan:   Brad Pineda was seen today for diabetes, hyperlipidemia and hypertension.  Diagnoses and all orders for this visit:  Diabetes mellitus type 2, uncontrolled, with complications -    Improved control, but not yet controlled. -    Fasting glucose 212 mg/dL compared to 229 mg/dL on 09/08/14 -    HbA1c 8.3 compared to 9.2 on 09/08/14 -    Pt on 513m BID of Metformin. He attempted taking 10087mBID in March of 2013, but got N/V from that dose. -    We gave the pt the option to try increasing his dose of Metformin again, but with a slower titration. Or referring him to an Endocrinologist. He decided to try increasing the Metformin.  -    Plan for pt to increase to taking 500 mg of Metformin TID. If he  tolerates this he should continue and we will re-assess in 3 months. We can think about increasing his dose more at this time if he is tolerating it. If he does not tolerate the increased dose, we will plan on referring to an endocrinologist. -    Encouraged pt to continue exercising regularly and making healthy eating choices. Orders: -     POCT glucose (manual entry) -     POCT glycosylated hemoglobin (Hb A1C)  Essential hypertension, benign       -     Well controlled. Continue current regimen.       -      If BP continues to remain this low and pt continues to make diet/exercise changes and lose weight, we can think about decreasing BP medication.   Mixed hyperlipidemia -     Tolerating current regimen, continue. Await lab results. Orders: -     Lipid panel  Screening for HIV (human immunodeficiency virus) -     Await lab results. Orders: -     HIV antibody    Yiannis Tulloch, PA-S [facility] 02/09/2015 10:54 AM

## 2015-02-09 NOTE — Patient Instructions (Signed)
I will contact you with your lab results as soon as they are available.   If you have not heard from me in 2 weeks, please contact me.  The fastest way to get your results is to register for My Chart (see the instructions on the last page of this printout).  If the increased dose of the metformin causes nausea and vomiting again, go back to just twice a day and let me know so I can make another plan.  Keep up the [place]!!!

## 2015-02-10 NOTE — Progress Notes (Signed)
Patient ID: Brad Pineda., male    DOB: [birth year], 39 y.o.   MRN: [id number]  PCP: Dorinda Stehr, PA-C  Subjective:   Chief Complaint  Patient presents with  . Diabetes  . Hyperlipidemia  . Hypertension    HPI Presents for evaluation of diabetes, hyperlipidemia, and HTN.   Pt states that he feels like his sugars are more controlled, however, he states that he is "too lazy" to check his glucose at home. He does perform a self foot exam at home every couple of days. He is currently being treated with metformin, glipizide, and januvia, and tolerating all of the medications well, without ADRs. He says that he used to notice that he would get dizzy if he hadn't eaten in 2 hours, but that has gotten better since his last visit. He notes polyuria, but denies polydipsia, polyphagia, dizziness, syncopal episodes, and numbness/tingling in his feet. Last saw Ophthalmologist in February 2016.   In regards to his BP, pt states, "as far as I know it's been fine". He does not monitor his BP at home. He is taking his medications at home as prescribed and tolerating them well. Denies HA, dizziness, chest pain or palpitations, and SOB.   Pt states that he has been working on trying to get more regular exercise. He walks about 4-5 loops around his apartment complex a few times a week. He has also tried to cut back on eating sweets and tried eating salad more often. He states that he thinks he has lost some weight since he has made these changes. On review of his past visits, he has lost 4 pounds since Dec 08, 2014.    Review of Systems Constitutional: Negative.  Eyes: Negative for photophobia and visual disturbance.  Respiratory: Negative.  Cardiovascular: Negative.  Gastrointestinal: Negative.  Endocrine: Positive for polyuria. Negative for cold intolerance, heat intolerance, polydipsia and polyphagia.  Genitourinary: Positive for frequency. Negative for dysuria, urgency, hematuria, flank pain,  enuresis and difficulty urinating.  Musculoskeletal: Negative.  Skin: Negative.  Neurological: Negative.      Patient Active Problem List   Diagnosis Date Noted  . OSA on CPAP 12/10/2013  . Obesity (BMI 30-39.9) 05/13/2013  . Diabetes mellitus type 2, uncontrolled, with complications   . Essential hypertension, benign   . Mixed hyperlipidemia   . GERD (gastroesophageal reflux disease)   . Anxiety      Prior to Admission medications   Medication Sig Start Date End Date Taking? Authorizing Provider  aspirin 81 MG tablet Take 162 mg by mouth once. Takes 3 qd   Yes Historical Provider, MD  cetirizine (ZYRTEC) 10 MG tablet Take 10 mg by mouth daily.   Yes Historical Provider, MD  enalapril (VASOTEC) 10 MG tablet Take 1 tablet (10 mg total) by mouth daily. 09/08/14  Yes Seira Cody, PA-C  fenofibrate (TRICOR) 145 MG tablet Take 1 tablet (145 mg total) by mouth daily. 09/08/14  Yes Tiphany Fayson, PA-C  fish oil-omega-3 fatty acids 1000 MG capsule Take 1 g by mouth daily.    Yes Historical Provider, MD  glipiZIDE (GLUCOTROL XL) 10 MG 24 hr tablet Take 1 tablet (10 mg total) by mouth daily with breakfast. 09/08/14  Yes Glessie Eustice, PA-C  hydrochlorothiazide (HYDRODIURIL) 25 MG tablet TAKE 1 TABLET (25 MG TOTAL) BY MOUTH DAILY. 01/27/15  Yes Makita Blow, PA-C  ibuprofen (ADVIL,MOTRIN) 200 MG tablet Take 200 mg by mouth every 6 (six) hours as needed.   Yes Historical Provider, MD  ipratropium (  ATROVENT) 0.03 % nasal spray Place 2 sprays into both nostrils every 12 (twelve) hours. 12/08/14  Yes Alexes Lamarque, PA-C  lansoprazole (PREVACID) 15 MG capsule Take 15 mg by mouth daily.   Yes Historical Provider, MD  losartan (COZAAR) 50 MG tablet Take 1 tablet (50 mg total) by mouth daily. 12/08/14  Yes Alan Drummer, PA-C  metFORMIN (GLUCOPHAGE) 500 MG tablet Take 1 tablet (500 mg total) by mouth 3 (three) times daily. 02/09/15  Yes Betsi Crespi, PA-C  niacin (NIASPAN) 500 MG CR tablet Take 1 tablet  (500 mg total) by mouth daily. 12/08/14  Yes Journey Ratterman, PA-C  PARoxetine (PAXIL) 40 MG tablet Take 1 tablet (40 mg total) by mouth daily. 09/08/14  Yes Dayanne Yiu, PA-C  pravastatin (PRAVACHOL) 40 MG tablet Take 1 tablet (40 mg total) by mouth daily. 09/08/14  Yes Gracelynne Benedict, PA-C  sitaGLIPtin (JANUVIA) 100 MG tablet Take 1 tablet (100 mg total) by mouth daily. 09/08/14  Yes Leeroy Lovings, PA-C     No Known Allergies     Objective:  Physical Exam  Constitutional: He is oriented to person, place, and time. Vital signs are normal. He appears well-developed and well-nourished. He is active and cooperative. No distress.  BP 104/70 mmHg  Pulse 90  Temp(Src) 98.8 F (37.1 C) (Oral)  Resp 16  Ht 5' 8"  (1.727 m)  Wt 206 lb 6.4 oz (93.622 kg)  BMI 31.39 kg/m2  SpO2 98%  HENT:  Head: Normocephalic and atraumatic.  Right Ear: Hearing normal.  Left Ear: Hearing normal.  Eyes: Conjunctivae are normal. No scleral icterus.  Neck: Normal range of motion. Neck supple. No thyromegaly present.  Cardiovascular: Normal rate, regular rhythm and normal heart sounds.   Pulses:      Radial pulses are 2+ on the right side, and 2+ on the left side.  Pulmonary/Chest: Effort normal and breath sounds normal.  Lymphadenopathy:       Head (right side): No tonsillar, no preauricular, no posterior auricular and no occipital adenopathy present.       Head (left side): No tonsillar, no preauricular, no posterior auricular and no occipital adenopathy present.    He has no cervical adenopathy.       Right: No supraclavicular adenopathy present.       Left: No supraclavicular adenopathy present.  Neurological: He is alert and oriented to person, place, and time. No sensory deficit.  Skin: Skin is warm, dry and intact. No rash noted. No cyanosis or erythema. Nails show no clubbing.  Psychiatric: He has a normal mood and affect.    Results for orders placed or performed in visit on 02/09/15  POCT glucose  (manual entry)  Result Value Ref Range   POC Glucose 212 (A) 70 - 99 mg/dl  POCT glycosylated hemoglobin (Hb A1C)  Result Value Ref Range   Hemoglobin A1C 8.3           Assessment & Plan:   1. Diabetes mellitus type 2, uncontrolled, with complications Improved control, but remains above goal. Increase metformin to TID. He's had GI upset in the past with doses higher than 500 mg BID. If it recurs, he's to reduce to BID and contact me. - POCT glucose (manual entry) - POCT glycosylated hemoglobin (Hb A1C) - metFORMIN (GLUCOPHAGE) 500 MG tablet; Take 1 tablet (500 mg total) by mouth 3 (three) times daily.  Dispense: 180 tablet; Refill: 3  2. Essential hypertension, benign Controlled. Continue current treatment.  3. Mixed hyperlipidemia Await lab  results. - Lipid panel  4. Screening for HIV (human immunodeficiency virus) - HIV antibody   Fara Chute, PA-C Physician Assistant-Certified [facility] .

## 2015-05-13 ENCOUNTER — Ambulatory Visit: Payer: Managed Care, Other (non HMO) | Admitting: Physician Assistant

## 2015-05-18 ENCOUNTER — Encounter: Payer: Self-pay | Admitting: Physician Assistant

## 2015-05-18 ENCOUNTER — Ambulatory Visit (INDEPENDENT_AMBULATORY_CARE_PROVIDER_SITE_OTHER): Payer: Managed Care, Other (non HMO) | Admitting: Physician Assistant

## 2015-05-18 VITALS — BP 122/70 | HR 93 | Temp 98.4°F | Resp 18 | Ht 68.5 in | Wt 206.0 lb

## 2015-05-18 DIAGNOSIS — I10 Essential (primary) hypertension: Secondary | ICD-10-CM

## 2015-05-18 DIAGNOSIS — E1165 Type 2 diabetes mellitus with hyperglycemia: Secondary | ICD-10-CM | POA: Diagnosis not present

## 2015-05-18 DIAGNOSIS — E782 Mixed hyperlipidemia: Secondary | ICD-10-CM

## 2015-05-18 DIAGNOSIS — E118 Type 2 diabetes mellitus with unspecified complications: Secondary | ICD-10-CM | POA: Diagnosis not present

## 2015-05-18 DIAGNOSIS — IMO0002 Reserved for concepts with insufficient information to code with codable children: Secondary | ICD-10-CM

## 2015-05-18 DIAGNOSIS — E669 Obesity, unspecified: Secondary | ICD-10-CM

## 2015-05-18 LAB — COMPREHENSIVE METABOLIC PANEL
ALK PHOS: 40 U/L (ref 40–115)
ALT: 52 U/L — AB (ref 9–46)
AST: 32 U/L (ref 10–40)
Albumin: 4.4 g/dL (ref 3.6–5.1)
BUN: 15 mg/dL (ref 7–25)
CO2: 27 mmol/L (ref 20–31)
CREATININE: 1 mg/dL (ref 0.60–1.35)
Calcium: 9.6 mg/dL (ref 8.6–10.3)
Chloride: 100 mmol/L (ref 98–110)
GLUCOSE: 123 mg/dL — AB (ref 65–99)
POTASSIUM: 3.6 mmol/L (ref 3.5–5.3)
SODIUM: 136 mmol/L (ref 135–146)
TOTAL PROTEIN: 6.4 g/dL (ref 6.1–8.1)
Total Bilirubin: 0.5 mg/dL (ref 0.2–1.2)

## 2015-05-18 LAB — LIPID PANEL
CHOLESTEROL: 135 mg/dL (ref 125–200)
HDL: 21 mg/dL — ABNORMAL LOW (ref >=40)
LDL Cholesterol: 84 mg/dL (ref <130)
Total CHOL/HDL Ratio: 6.4 Ratio — ABNORMAL HIGH (ref <=5.0)
Triglycerides: 151 mg/dL — ABNORMAL HIGH (ref <150)
VLDL: 30 mg/dL (ref <30)

## 2015-05-18 LAB — POCT GLYCOSYLATED HEMOGLOBIN (HGB A1C): Hemoglobin A1C: 8.5

## 2015-05-18 LAB — GLUCOSE, POCT (MANUAL RESULT ENTRY): POC Glucose: 144 mg/dl — AB (ref 70–99)

## 2015-05-18 MED ORDER — CANAGLIFLOZIN 100 MG PO TABS: 100.0000 mg | ORAL_TABLET | Freq: Every day | ORAL | Status: DC

## 2015-05-18 NOTE — Progress Notes (Signed)
Subjective:     Patient ID: Brad Pettijohn., male   DOB: 07-21-1976, 39 y.o.   MRN: [id number]  PCP: JEFFERY,CHELLE, PA-C  Chief Complaint  Patient presents with  . Diabetes    has not checked his sugar    HPI Patient presents today for evaluation of diabetes, hypertension, and hyperlipidemia.  Patient is taking Glipizide, Januvia and Metformin. No abdominal pain, nausea, vomiting, diarrhea, myalgias, or rashes. He is tolerating the medication well. He has not been checking his blood sugar at home because of "laziness." The last time he checked was a month ago and cannot remember the reading. He has been trying to eat more salads and has been trying to walk more during the week. His last eye exam was March or April of 2016.   He is taking enalapril, HCTZ, and losartan for BP control. He does not regularly check his BP. He states that he knows his BP is high because he will sweat a lot, however this has not happened recently. No HA, blurry vision, chest pain, or shortness of breath. He has not worn his CPAP in well over a year. He broke his foot in July of 2015 and ever since then he just has not been able to sleep with it on. He does snore at night and does not feel well rested after sleeping.   He is taking pravastatin, niacin, and omega 3s for cholesterol control.   He states that he does need any refills of any medications.  Review of Systems  Respiratory: Negative for shortness of breath.   Cardiovascular: Negative for chest pain.  Gastrointestinal: Negative for nausea, vomiting, abdominal pain and diarrhea.  Endocrine: Negative for polydipsia, polyphagia and polyuria.  Genitourinary: Negative for dysuria, urgency and frequency ("This is not new for me.").  Musculoskeletal: Negative for myalgias.  Skin: Negative for rash.  Neurological: Negative for dizziness, syncope, weakness, light-headedness and headaches.  See HPI    Patient Active Problem List   Diagnosis Date Noted  .  OSA on CPAP 12/10/2013  . Obesity (BMI 30-39.9) 05/13/2013  . Diabetes mellitus type 2, uncontrolled, with complications ([place])   . Essential hypertension, benign   . Mixed hyperlipidemia   . GERD (gastroesophageal reflux disease)   . Anxiety     Prior to Admission medications   Medication Sig Start Date End Date Taking? Authorizing Provider  aspirin 81 MG tablet Take 162 mg by mouth once. Takes 3 qd   Yes Historical Provider, MD  cetirizine (ZYRTEC) 10 MG tablet Take 10 mg by mouth daily.   Yes Historical Provider, MD  enalapril (VASOTEC) 10 MG tablet Take 1 tablet (10 mg total) by mouth daily. 09/08/14  Yes Chelle Jeffery, PA-C  fenofibrate (TRICOR) 145 MG tablet Take 1 tablet (145 mg total) by mouth daily. 09/08/14  Yes Chelle Jeffery, PA-C  fish oil-omega-3 fatty acids 1000 MG capsule Take 1 g by mouth daily.    Yes Historical Provider, MD  glipiZIDE (GLUCOTROL XL) 10 MG 24 hr tablet Take 1 tablet (10 mg total) by mouth daily with breakfast. 09/08/14  Yes Chelle Jeffery, PA-C  hydrochlorothiazide (HYDRODIURIL) 25 MG tablet TAKE 1 TABLET (25 MG TOTAL) BY MOUTH DAILY. 01/27/15  Yes Chelle Jeffery, PA-C  ibuprofen (ADVIL,MOTRIN) 200 MG tablet Take 200 mg by mouth every 6 (six) hours as needed.   Yes Historical Provider, MD  ipratropium (ATROVENT) 0.03 % nasal spray Place 2 sprays into both nostrils every 12 (twelve) hours. 12/08/14  Yes Harrison Mons,  PA-C  lansoprazole (PREVACID) 15 MG capsule Take 15 mg by mouth daily.   Yes Historical Provider, MD  losartan (COZAAR) 50 MG tablet Take 1 tablet (50 mg total) by mouth daily. 12/08/14  Yes Chelle Jeffery, PA-C  metFORMIN (GLUCOPHAGE) 500 MG tablet Take 1 tablet (500 mg total) by mouth 3 (three) times daily. 02/09/15  Yes Chelle Jeffery, PA-C  niacin (NIASPAN) 500 MG CR tablet Take 1 tablet (500 mg total) by mouth daily. 12/08/14  Yes Chelle Jeffery, PA-C  PARoxetine (PAXIL) 40 MG tablet Take 1 tablet (40 mg total) by mouth daily. 09/08/14  Yes Chelle Jeffery,  PA-C  pravastatin (PRAVACHOL) 40 MG tablet Take 1 tablet (40 mg total) by mouth daily. 09/08/14  Yes Chelle Jeffery, PA-C  sitaGLIPtin (JANUVIA) 100 MG tablet Take 1 tablet (100 mg total) by mouth daily. 09/08/14  Yes Chelle Jeffery, PA-C    No Known Allergies    Objective:  Physical Exam  Constitutional: He is oriented to person, place, and time. He appears well-developed and well-nourished.  HENT:  Head: Normocephalic and atraumatic.  Eyes: Pupils are equal, round, and reactive to light.  Neck: Normal range of motion. Neck supple.  Cardiovascular: Normal rate and regular rhythm.   Pulmonary/Chest: Effort normal and breath sounds normal.  Abdominal: Soft. Bowel sounds are normal.  Lymphadenopathy:    He has no cervical adenopathy.  Neurological: He is alert and oriented to person, place, and time.  Skin: Skin is warm and dry.  Psychiatric: He has a normal mood and affect. His behavior is normal. Thought content normal.     BP 122/70 mmHg  Pulse 93  Temp(Src) 98.4 F (36.9 C)  Resp 18  Ht 5' 8.5" (1.74 m)  Wt 206 lb (93.441 kg)  BMI 30.86 kg/m2  SpO2 98%    Results for orders placed or performed in visit on 05/18/15  POCT glucose (manual entry)  Result Value Ref Range   POC Glucose 144 (A) 70 - 99 mg/dl  POCT glycosylated hemoglobin (Hb A1C)  Result Value Ref Range   Hemoglobin A1C 8.5       Assessment & Plan:  1. Uncontrolled type 2 diabetes mellitus with complication, without long-term current use of insulin (HCC) Uncontrolled. Current visit A1C is 8.5, elevated from last visit's A1C of 8.3. Add Invokana. Counseled on the long-term health risks of uncontrolled diabetes. If adding a 4th agent does not control his blood sugar, expect to refer to endocrinology.  - POCT glucose (manual entry) - POCT glycosylated hemoglobin (Hb A1C) - Comprehensive metabolic panel - Microalbumin, urine - canagliflozin (INVOKANA) 100 MG TABS tablet; Take 1 tablet (100 mg total) by mouth  daily.  Dispense: 90 tablet; Refill: 3 - Ambulatory referral to diabetic education  2. Essential hypertension, benign Await lab results. Continue current treatment.   3. Mixed hyperlipidemia Await lab results. Continue current treatment.  - Lipid panel  4. Obesity (BMI 30-39.9) Referral to Diabetes education. Discussed that if he can lose weight, this might eliminate his sleep apnea and need for CPAP.    Follow-up in 3 months (around 08/18/2015).  Danel Studzinski D. Race, PA-S Physician Assistant Student [facility]

## 2015-05-18 NOTE — Patient Instructions (Addendum)
I will contact you with your lab results as soon as they are available.   If you have not heard from me in 2 weeks, please contact me.  The fastest way to get your results is to register for My Chart (see the instructions on the last page of this printout).  I have sent a new medication to the pharmacy, [facility]. It is to take IN ADDITION to the others. You need to work hard on healthy eating and getting regular exercise. To help, I've referred you to Diabetes and Nutrition education. They will contact you directly to schedule. While you are not having symptoms now, having uncontrolled diabetes increases your risk of heart attack, stroke, kidney failure, amputation of toes/feet, and early death. If there is not improvement at your next visit, I will refer you to the endocrinologist to make further adjustments.

## 2015-05-18 NOTE — Progress Notes (Signed)
Patient ID: Brad Keetch., male    DOB: [birth year], 39 y.o.   MRN: [id number]  PCP: Morayma Godown, PA-C  Subjective:   Chief Complaint  Patient presents with  . Diabetes    has not checked his sugar    HPI Presents for evaluation of diabetes, hypertension, and hyperlipidemia.  Patient is taking Glipizide, Januvia and Metformin. No abdominal pain, nausea, vomiting, diarrhea, myalgias, or rashes. He is tolerating the medication well. He has not been checking his blood sugar at home because of "laziness." The last time he checked was a month ago and cannot remember the reading. He has been trying to eat more salads and has been trying to walk more during the week. His last eye exam was March or April of 2016.  He is taking enalapril, HCTZ, and losartan for BP control. He does not regularly check his BP. He states that he knows his BP is high because he will sweat a lot, however this has not happened recently. No HA, blurry vision, chest pain, or shortness of breath. He has not worn his CPAP in well over a year. He broke his foot in July of 2015 and ever since then he just has not been able to sleep with it on.  He is taking pravastatin, niacin, and omega 3s for cholesterol control.  He states that he does need any refills of any medications.   He has not used CPAP in a year. Cannot tolerate the mask. Is unwilling to try nasal pillows or BiPAP at this time.    Review of Systems Respiratory: Negative for shortness of breath.  Cardiovascular: Negative for chest pain.  Gastrointestinal: Negative for nausea, vomiting, abdominal pain and diarrhea.  Endocrine: Negative for polydipsia, polyphagia and polyuria.  Genitourinary: Negative for dysuria, urgency and frequency ("This is not new for me.").  Musculoskeletal: Negative for myalgias.  Skin: Negative for rash.  Neurological: Negative for dizziness, syncope, weakness, light-headedness and headaches.  See HPI    Patient Active Problem  List   Diagnosis Date Noted  . OSA on CPAP 12/10/2013  . Obesity (BMI 30-39.9) 05/13/2013  . Diabetes mellitus type 2, uncontrolled, with complications ([place])   . Essential hypertension, benign   . Mixed hyperlipidemia   . GERD (gastroesophageal reflux disease)   . Anxiety      Prior to Admission medications   Medication Sig Start Date End Date Taking? Authorizing Provider  aspirin 81 MG tablet Take 162 mg by mouth once. Takes 3 qd   Yes Historical Provider, MD  cetirizine (ZYRTEC) 10 MG tablet Take 10 mg by mouth daily.   Yes Historical Provider, MD  enalapril (VASOTEC) 10 MG tablet Take 1 tablet (10 mg total) by mouth daily. 09/08/14  Yes Ivon Oelkers, PA-C  fenofibrate (TRICOR) 145 MG tablet Take 1 tablet (145 mg total) by mouth daily. 09/08/14  Yes Denys Labree, PA-C  fish oil-omega-3 fatty acids 1000 MG capsule Take 1 g by mouth daily.    Yes Historical Provider, MD  glipiZIDE (GLUCOTROL XL) 10 MG 24 hr tablet Take 1 tablet (10 mg total) by mouth daily with breakfast. 09/08/14  Yes Kate Sweetman, PA-C  hydrochlorothiazide (HYDRODIURIL) 25 MG tablet TAKE 1 TABLET (25 MG TOTAL) BY MOUTH DAILY. 01/27/15  Yes Jhovany Weidinger, PA-C  ibuprofen (ADVIL,MOTRIN) 200 MG tablet Take 200 mg by mouth every 6 (six) hours as needed.   Yes Historical Provider, MD  ipratropium (ATROVENT) 0.03 % nasal spray Place 2 sprays into both nostrils every  12 (twelve) hours. 12/08/14  Yes Jacayla Nordell, PA-C  lansoprazole (PREVACID) 15 MG capsule Take 15 mg by mouth daily.   Yes Historical Provider, MD  losartan (COZAAR) 50 MG tablet Take 1 tablet (50 mg total) by mouth daily. 12/08/14  Yes Aki Burdin, PA-C  metFORMIN (GLUCOPHAGE) 500 MG tablet Take 1 tablet (500 mg total) by mouth 3 (three) times daily. 02/09/15  Yes Amedee Cerrone, PA-C  niacin (NIASPAN) 500 MG CR tablet Take 1 tablet (500 mg total) by mouth daily. 12/08/14  Yes Tahnee Cifuentes, PA-C  PARoxetine (PAXIL) 40 MG tablet Take 1 tablet (40 mg total) by mouth  daily. 09/08/14  Yes Khamya Topp, PA-C  pravastatin (PRAVACHOL) 40 MG tablet Take 1 tablet (40 mg total) by mouth daily. 09/08/14  Yes Guinn Delarosa, PA-C  sitaGLIPtin (JANUVIA) 100 MG tablet Take 1 tablet (100 mg total) by mouth daily. 09/08/14  Yes Jazmine Longshore, PA-C     No Known Allergies     Objective:  Physical Exam  Constitutional: He is oriented to person, place, and time. Vital signs are normal. He appears well-developed and well-nourished. He is active and cooperative. No distress.  BP 122/70 mmHg  Pulse 93  Temp(Src) 98.4 F (36.9 C)  Resp 18  Ht 5' 8.5" (1.74 m)  Wt 206 lb (93.441 kg)  BMI 30.86 kg/m2  SpO2 98%  HENT:  Head: Normocephalic and atraumatic.  Right Ear: Hearing normal.  Left Ear: Hearing normal.  Eyes: Conjunctivae are normal. No scleral icterus.  Neck: Normal range of motion. Neck supple. No thyromegaly present.  Cardiovascular: Normal rate, regular rhythm and normal heart sounds.   Pulses:      Radial pulses are 2+ on the right side, and 2+ on the left side.  Pulmonary/Chest: Effort normal and breath sounds normal.  Lymphadenopathy:       Head (right side): No tonsillar, no preauricular, no posterior auricular and no occipital adenopathy present.       Head (left side): No tonsillar, no preauricular, no posterior auricular and no occipital adenopathy present.    He has no cervical adenopathy.       Right: No supraclavicular adenopathy present.       Left: No supraclavicular adenopathy present.  Neurological: He is alert and oriented to person, place, and time. No sensory deficit.  Skin: Skin is warm, dry and intact. No rash noted. No cyanosis or erythema. Nails show no clubbing.  Psychiatric: He has a normal mood and affect. His speech is normal and behavior is normal.       Results for orders placed or performed in visit on 05/18/15  POCT glucose (manual entry)  Result Value Ref Range   POC Glucose 144 (A) 70 - 99 mg/dl  POCT glycosylated  hemoglobin (Hb A1C)  Result Value Ref Range   Hemoglobin A1C 8.5        Assessment & Plan:   1. Uncontrolled type 2 diabetes mellitus with complication, without long-term current use of insulin ([place]) Has had persistent microalbuminuria. Discussed increased risks associated with uncontrolled diabetes and urged him to make the recommended lifestyle changes. Refer for education. Add Invokana. If no significant improvement, refer to endocrinology at next visit. - POCT glucose (manual entry) - POCT glycosylated hemoglobin (Hb A1C) - Comprehensive metabolic panel - Microalbumin, urine - canagliflozin (INVOKANA) 100 MG TABS tablet; Take 1 tablet (100 mg total) by mouth daily.  Dispense: 90 tablet; Refill: 3 - Ambulatory referral to diabetic education  2. Essential hypertension,  benign Controlled.  3. Mixed hyperlipidemia Await labs. - Lipid panel  4. Obesity (BMI 30-39.9) Lifestyle changes as above.   Return in about 3 months (around 08/18/2015) for re-evaluation of diabetes.   Fara Chute, PA-C Physician Assistant-Certified [facility]

## 2015-05-19 LAB — MICROALBUMIN, URINE: Microalb, Ur: 1.2 mg/dL (ref <2.0)

## 2015-05-21 ENCOUNTER — Encounter: Payer: Self-pay | Admitting: Physician Assistant

## 2015-05-21 ENCOUNTER — Telehealth: Payer: Self-pay

## 2015-05-21 MED ORDER — DAPAGLIFLOZIN PROPANEDIOL 5 MG PO TABS: 5.0000 mg | ORAL_TABLET | Freq: Every day | ORAL | Status: DC

## 2015-05-21 NOTE — Telephone Encounter (Signed)
Meds ordered this encounter  Medications  . dapagliflozin propanediol (FARXIGA) 5 MG TABS tablet    Sig: Take 5 mg by mouth daily.    Dispense:  90 tablet    Refill:  3    Order Specific Question:  Supervising Provider    Answer:  DOOLITTLE, ROBERT P [[id number]]

## 2015-05-21 NOTE — Telephone Encounter (Signed)
PA is needed for Invokana. It looks like faxiga and jardiance are both preferred over invokana and will need to be tried first. [place], do you want to rx on of these? We have savings cards for Wilder Glade, I know, that should get the farxiga for free each month if he has commercial plan.

## 2015-05-24 NOTE — Telephone Encounter (Signed)
LMOM for pt explaining the new Rx and savings card. Will put a card in the drawer for p/up.

## 2015-07-05 ENCOUNTER — Other Ambulatory Visit: Payer: Self-pay | Admitting: Physician Assistant
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
## 2015-08-22 ENCOUNTER — Other Ambulatory Visit: Payer: Self-pay | Admitting: Physician Assistant

## 2015-08-24 ENCOUNTER — Encounter: Payer: Self-pay | Admitting: Physician Assistant

## 2015-08-24 ENCOUNTER — Telehealth: Payer: Self-pay | Admitting: Physician Assistant

## 2015-08-24 ENCOUNTER — Ambulatory Visit (INDEPENDENT_AMBULATORY_CARE_PROVIDER_SITE_OTHER): Payer: Managed Care, Other (non HMO) | Admitting: Physician Assistant

## 2015-08-24 VITALS — BP 110/66 | HR 103 | Temp 98.6°F | Resp 16 | Ht 68.5 in | Wt 200.6 lb

## 2015-08-24 DIAGNOSIS — E782 Mixed hyperlipidemia: Secondary | ICD-10-CM

## 2015-08-24 DIAGNOSIS — E1165 Type 2 diabetes mellitus with hyperglycemia: Secondary | ICD-10-CM | POA: Diagnosis not present

## 2015-08-24 DIAGNOSIS — H409 Unspecified glaucoma: Secondary | ICD-10-CM | POA: Insufficient documentation

## 2015-08-24 DIAGNOSIS — E118 Type 2 diabetes mellitus with unspecified complications: Secondary | ICD-10-CM | POA: Diagnosis not present

## 2015-08-24 DIAGNOSIS — IMO0002 Reserved for concepts with insufficient information to code with codable children: Secondary | ICD-10-CM

## 2015-08-24 DIAGNOSIS — M654 Radial styloid tenosynovitis [de Quervain]: Secondary | ICD-10-CM | POA: Diagnosis not present

## 2015-08-24 DIAGNOSIS — I10 Essential (primary) hypertension: Secondary | ICD-10-CM | POA: Diagnosis not present

## 2015-08-24 DIAGNOSIS — K7581 Nonalcoholic steatohepatitis (NASH): Secondary | ICD-10-CM | POA: Insufficient documentation

## 2015-08-24 LAB — COMPREHENSIVE METABOLIC PANEL
ALT: 39 U/L (ref 9–46)
AST: 26 U/L (ref 10–40)
Albumin: 4.4 g/dL (ref 3.6–5.1)
Alkaline Phosphatase: 37 U/L — ABNORMAL LOW (ref 40–115)
BUN: 18 mg/dL (ref 7–25)
CO2: 25 mmol/L (ref 20–31)
Calcium: 9.2 mg/dL (ref 8.6–10.3)
Chloride: 101 mmol/L (ref 98–110)
Creat: 1.27 mg/dL (ref 0.60–1.35)
GLUCOSE: 115 mg/dL — AB (ref 65–99)
Potassium: 4.1 mmol/L (ref 3.5–5.3)
Sodium: 135 mmol/L (ref 135–146)
Total Bilirubin: 0.5 mg/dL (ref 0.2–1.2)
Total Protein: 6.7 g/dL (ref 6.1–8.1)

## 2015-08-24 LAB — LIPID PANEL
CHOL/HDL RATIO: 4.7 ratio (ref <=5.0)
CHOLESTEROL: 113 mg/dL — AB (ref 125–200)
HDL: 24 mg/dL — ABNORMAL LOW (ref >=40)
LDL Cholesterol: 67 mg/dL (ref <130)
TRIGLYCERIDES: 112 mg/dL (ref <150)
VLDL: 22 mg/dL (ref <30)

## 2015-08-24 MED ORDER — GLIPIZIDE ER 10 MG PO TB24: ORAL_TABLET | ORAL | Status: DC

## 2015-08-24 MED ORDER — METFORMIN HCL 500 MG PO TABS: 500.0000 mg | ORAL_TABLET | Freq: Three times a day (TID) | ORAL | Status: DC

## 2015-08-24 MED ORDER — SITAGLIPTIN PHOSPHATE 100 MG PO TABS: 100.0000 mg | ORAL_TABLET | Freq: Every day | ORAL | Status: DC

## 2015-08-24 MED ORDER — PRAVASTATIN SODIUM 40 MG PO TABS: 40.0000 mg | ORAL_TABLET | Freq: Every day | ORAL | Status: DC

## 2015-08-24 MED ORDER — ENALAPRIL MALEATE 10 MG PO TABS: ORAL_TABLET | ORAL | Status: DC

## 2015-08-24 MED ORDER — LOSARTAN POTASSIUM 100 MG PO TABS: 100.0000 mg | ORAL_TABLET | Freq: Every day | ORAL | Status: DC

## 2015-08-24 NOTE — Patient Instructions (Signed)
Wear the splint when you are working and while you sleep. The more you wear it, the better, but you may certainly remove it as needed, for bathing, etc.  Try working on continuing with healthier eating choices and see if you can add some exercise outside of work.  I will contact you with your lab results as soon as they are available.   If you have not heard from me in 2 weeks, please contact me.  The fastest way to get your results is to register for My Chart (see the instructions on the last page of this printout).

## 2015-08-24 NOTE — Progress Notes (Signed)
Patient ID: Brad Schlageter., male    DOB: 07-18-1976, 40 y.o.   MRN: [id number]  PCP: Agustine Rossitto, PA-C  Subjective:   Chief Complaint  Patient presents with  . Follow-up  . Diabetes    HPI Presents for evaluation of diabetes, HTN and hyperlipidemia.  Notes he's lost about 6 lbs. Doesn't weigh himself at home. He's eating differently, somewhat intentionally. No intentional exercise, but remains very active at work.  Several weeks of RIGHT thumb and hand pain. No catching, popping, locking. He is RIGHT hand dominant.  I note today that he is on both an ACEI and ARB. Neither of [place] has recollection of why he is on this combination, as it appears to have been started before I started following him. He is tolerating the combination well, without any adverse effects, and labs have been stable.  In general, he takes all his medications regularly, but does not adhere to the lifestyle recommendations related to diet and exercise. As such, we've had difficultly controlling his diabetes and lipids.   Review of Systems  Constitutional: Positive for unexpected weight change.  HENT: Negative.   Eyes: Negative.   Respiratory: Negative.   Cardiovascular: Negative.   Gastrointestinal: Positive for anal bleeding (chronic anal fissure, since childhood; not interested in evaluation/treatment). Negative for nausea, vomiting, abdominal pain, diarrhea, constipation, blood in stool, abdominal distention and rectal pain.  Endocrine: Negative.   Genitourinary: Negative.   Musculoskeletal: Positive for arthralgias (RIGHT hand).  Skin: Negative.   Allergic/Immunologic: Negative.   Neurological: Negative.   Hematological: Negative.        Patient Active Problem List   Diagnosis Date Noted  . OSA on CPAP 12/10/2013  . Obesity (BMI 30-39.9) 05/13/2013  . Diabetes mellitus type 2, uncontrolled, with complications ([place])   . Essential hypertension, benign   . Mixed hyperlipidemia   . GERD  (gastroesophageal reflux disease)   . Anxiety      Prior to Admission medications   Medication Sig Start Date End Date Taking? Authorizing Provider  ammonium lactate (LAC-HYDRIN) 12 % lotion Apply 1 application topically 2 (two) times daily 07/06/15  Yes Ralphie Lovelady, PA-C  aspirin 81 MG tablet Take 162 mg by mouth once. Takes 3 qd   Yes Historical Provider, MD  cetirizine (ZYRTEC) 10 MG tablet Take 10 mg by mouth daily.   Yes Historical Provider, MD  dapagliflozin propanediol (FARXIGA) 5 MG TABS tablet Take 5 mg by mouth daily. 05/21/15  Yes Oshay Stranahan, PA-C  enalapril (VASOTEC) 10 MG tablet TAKE 1 TABLET (10 MG TOTAL) BY MOUTH DAILY. 08/22/15  Yes Ellinor Test, PA-C  fenofibrate (TRICOR) 145 MG tablet Take 1 tablet (145 mg total) by mouth daily. 09/08/14  Yes Oriah Leinweber, PA-C  fish oil-omega-3 fatty acids 1000 MG capsule Take 1 g by mouth daily.    Yes Historical Provider, MD  glipiZIDE (GLUCOTROL XL) 10 MG 24 hr tablet TAKE 1 TABLET (10 MG TOTAL) BY MOUTH DAILY WITH BREAKFAST. 08/22/15  Yes Maribeth Jiles, PA-C  hydrochlorothiazide (HYDRODIURIL) 25 MG tablet TAKE 1 TABLET (25 MG TOTAL) BY MOUTH DAILY. 01/27/15  Yes Monet North, PA-C  ibuprofen (ADVIL,MOTRIN) 200 MG tablet Take 200 mg by mouth every 6 (six) hours as needed.   Yes Historical Provider, MD  ipratropium (ATROVENT) 0.03 % nasal spray Place 2 sprays into both nostrils every 12 (twelve) hours. 12/08/14  Yes Nely Dedmon, PA-C  lansoprazole (PREVACID) 15 MG capsule Take 15 mg by mouth daily.   Yes Historical Provider,  MD  losartan (COZAAR) 50 MG tablet Take 1 tablet (50 mg total) by mouth daily. 12/08/14  Yes Malita Ignasiak, PA-C  metFORMIN (GLUCOPHAGE) 500 MG tablet Take 1 tablet (500 mg total) by mouth 3 (three) times daily. 02/09/15  Yes Evann Koelzer, PA-C  niacin (NIASPAN) 500 MG CR tablet Take 1 tablet (500 mg total) by mouth daily. 12/08/14  Yes Traycen Goyer, PA-C  PARoxetine (PAXIL) 40 MG tablet Take 1 tablet (40 mg  total) by mouth daily. 09/08/14  Yes Darrow Barreiro, PA-C  pravastatin (PRAVACHOL) 40 MG tablet Take 1 tablet (40 mg total) by mouth daily. 09/08/14  Yes Weston Fulco, PA-C  sitaGLIPtin (JANUVIA) 100 MG tablet Take 1 tablet (100 mg total) by mouth daily. 09/08/14  Yes Ayvah Caroll, PA-C     No Known Allergies     Objective:  Physical Exam  Constitutional: He is oriented to person, place, and time. Vital signs are normal. He appears well-developed and well-nourished. He is active and cooperative. No distress.  BP 110/66 mmHg  Pulse 103  Temp(Src) 98.6 F (37 C) (Oral)  Resp 16  Ht 5' 8.5" (1.74 m)  Wt 200 lb 9.6 oz (90.992 kg)  BMI 30.05 kg/m2  SpO2 98%  HENT:  Head: Normocephalic and atraumatic.  Right Ear: Hearing normal.  Left Ear: Hearing normal.  Eyes: Conjunctivae are normal. No scleral icterus.  Neck: Normal range of motion. Neck supple. No thyromegaly present.  Cardiovascular: Normal rate, regular rhythm and normal heart sounds.   Pulses:      Radial pulses are 2+ on the right side, and 2+ on the left side.  Pulmonary/Chest: Effort normal and breath sounds normal.  Musculoskeletal:       Right wrist: Normal.       Right hand: He exhibits tenderness. He exhibits normal range of motion, no bony tenderness, normal two-point discrimination, normal capillary refill, no deformity, no laceration and no swelling. Normal sensation noted. Normal strength noted.       Hands: Lymphadenopathy:       Head (right side): No tonsillar, no preauricular, no posterior auricular and no occipital adenopathy present.       Head (left side): No tonsillar, no preauricular, no posterior auricular and no occipital adenopathy present.    He has no cervical adenopathy.       Right: No supraclavicular adenopathy present.       Left: No supraclavicular adenopathy present.  Neurological: He is alert and oriented to person, place, and time. No sensory deficit.  Skin: Skin is warm, dry and intact. No  rash noted. No cyanosis or erythema. Nails show no clubbing.  Psychiatric: He has a normal mood and affect. His speech is normal and behavior is normal.           Assessment & Plan:   1. Uncontrolled type 2 diabetes mellitus with complication, without long-term current use of insulin ([place]) Await lab results. Counseled again on healthy eating and regular exercise. I am hopeful that his weight loss is due to the healthier eating, rather than further loss of glycemia control. - HM Diabetes Foot Exam - Comprehensive metabolic panel - Hemoglobin A1c - metFORMIN (GLUCOPHAGE) 500 MG tablet; Take 1 tablet (500 mg total) by mouth 3 (three) times daily.  Dispense: 180 tablet; Refill: 3 - sitaGLIPtin (JANUVIA) 100 MG tablet; Take 1 tablet (100 mg total) by mouth daily.  Dispense: 90 tablet; Refill: 3 - glipiZIDE (GLUCOTROL XL) 10 MG 24 hr tablet; TAKE 1 TABLET (10  MG TOTAL) BY MOUTH DAILY WITH BREAKFAST.  Dispense: 90 tablet; Refill: 3  2. Essential hypertension, benign Controlled. I will pull his paper record and see if we can find the rationale for use of ARB + ACEI. He is tolerating it well and his BP is well controlled. Continue to monitor renal function. - Comprehensive metabolic panel - enalapril (VASOTEC) 10 MG tablet; TAKE 1 TABLET (10 MG TOTAL) BY MOUTH DAILY.  Dispense: 90 tablet; Refill: 3  3. Mixed hyperlipidemia Lifestyle changes encouraged. He is not having adverse effects of the current combination of statin and fenofibrate. - Lipid panel - pravastatin (PRAVACHOL) 40 MG tablet; Take 1 tablet (40 mg total) by mouth daily.  Dispense: 90 tablet; Refill: 3  4. De Quervain's tenosynovitis, right Thumb spica splint. If he's not had considerable improvement in the next 2-4 weeks, he'll let me know.   Return in about 3 months (around 11/22/2015).   Fara Chute, PA-C Physician Assistant-Certified [facility]

## 2015-08-24 NOTE — Telephone Encounter (Signed)
Please call this patient (he goes by [employer]").  I reviewed his paper record to understand why he is on both the losartan AND the enalapril.  I appears that the enalapril was added instead of increasing the losartan dose when his urine microalbumin was elevated in 07/2012.  Please STOP the enalapril. INCREASE the losartan from 50 mg to 100 mg. He can use up the 50 mg tablets that he has by taking 2 together one time each day, and then start the new Rx for 100 mg.

## 2015-08-25 LAB — HEMOGLOBIN A1C
Hgb A1c MFr Bld: 7.3 % — ABNORMAL HIGH (ref <5.7)
Mean Plasma Glucose: 163 mg/dL — ABNORMAL HIGH (ref <117)

## 2015-08-25 NOTE — Telephone Encounter (Signed)
Spoke with pt, advised message from [place]. Pt understood.

## 2015-08-25 NOTE — Telephone Encounter (Signed)
Left VM for pt to call back.

## 2015-08-27 ENCOUNTER — Encounter: Payer: Self-pay | Admitting: Physician Assistant

## 2015-09-01 ENCOUNTER — Encounter: Payer: Self-pay | Admitting: Physician Assistant

## 2015-09-22 LAB — HM DIABETES EYE EXAM

## 2015-11-18 ENCOUNTER — Other Ambulatory Visit: Payer: Self-pay | Admitting: Physician Assistant

## 2015-11-25 ENCOUNTER — Other Ambulatory Visit: Payer: Self-pay | Admitting: Physician Assistant

## 2015-11-30 ENCOUNTER — Other Ambulatory Visit: Payer: Self-pay

## 2015-11-30 DIAGNOSIS — E118 Type 2 diabetes mellitus with unspecified complications: Principal | ICD-10-CM

## 2015-11-30 DIAGNOSIS — E1165 Type 2 diabetes mellitus with hyperglycemia: Secondary | ICD-10-CM

## 2015-11-30 DIAGNOSIS — IMO0002 Reserved for concepts with insufficient information to code with codable children: Secondary | ICD-10-CM

## 2015-11-30 MED ORDER — METFORMIN HCL 500 MG PO TABS: 500.0000 mg | ORAL_TABLET | Freq: Three times a day (TID) | ORAL | Status: DC

## 2015-12-07 ENCOUNTER — Encounter: Payer: Self-pay | Admitting: *Deleted

## 2015-12-07 ENCOUNTER — Ambulatory Visit (INDEPENDENT_AMBULATORY_CARE_PROVIDER_SITE_OTHER): Payer: Managed Care, Other (non HMO) | Admitting: Physician Assistant

## 2015-12-07 ENCOUNTER — Other Ambulatory Visit: Payer: Self-pay | Admitting: Physician Assistant

## 2015-12-07 ENCOUNTER — Encounter: Payer: Self-pay | Admitting: Physician Assistant

## 2015-12-07 VITALS — BP 100/70 | HR 87 | Temp 98.9°F | Resp 16 | Ht 68.75 in | Wt 196.8 lb

## 2015-12-07 DIAGNOSIS — I10 Essential (primary) hypertension: Secondary | ICD-10-CM | POA: Diagnosis not present

## 2015-12-07 DIAGNOSIS — E1165 Type 2 diabetes mellitus with hyperglycemia: Secondary | ICD-10-CM | POA: Diagnosis not present

## 2015-12-07 DIAGNOSIS — E669 Obesity, unspecified: Secondary | ICD-10-CM | POA: Diagnosis not present

## 2015-12-07 DIAGNOSIS — E782 Mixed hyperlipidemia: Secondary | ICD-10-CM

## 2015-12-07 DIAGNOSIS — E118 Type 2 diabetes mellitus with unspecified complications: Secondary | ICD-10-CM | POA: Diagnosis not present

## 2015-12-07 DIAGNOSIS — Z6829 Body mass index (BMI) 29.0-29.9, adult: Secondary | ICD-10-CM

## 2015-12-07 DIAGNOSIS — IMO0002 Reserved for concepts with insufficient information to code with codable children: Secondary | ICD-10-CM

## 2015-12-07 DIAGNOSIS — J309 Allergic rhinitis, unspecified: Secondary | ICD-10-CM

## 2015-12-07 LAB — COMPREHENSIVE METABOLIC PANEL
ALK PHOS: 40 U/L (ref 40–115)
ALT: 33 U/L (ref 9–46)
AST: 21 U/L (ref 10–40)
Albumin: 4.4 g/dL (ref 3.6–5.1)
BUN: 19 mg/dL (ref 7–25)
CALCIUM: 9.8 mg/dL (ref 8.6–10.3)
CHLORIDE: 100 mmol/L (ref 98–110)
CO2: 27 mmol/L (ref 20–31)
Creat: 1.08 mg/dL (ref 0.60–1.35)
GLUCOSE: 116 mg/dL — AB (ref 65–99)
Potassium: 3.8 mmol/L (ref 3.5–5.3)
Sodium: 139 mmol/L (ref 135–146)
TOTAL PROTEIN: 6.9 g/dL (ref 6.1–8.1)
Total Bilirubin: 0.6 mg/dL (ref 0.2–1.2)

## 2015-12-07 LAB — LIPID PANEL
CHOLESTEROL: 128 mg/dL (ref 125–200)
HDL: 25 mg/dL — AB (ref >=40)
LDL Cholesterol: 74 mg/dL (ref <130)
Total CHOL/HDL Ratio: 5.1 Ratio — ABNORMAL HIGH (ref <=5.0)
Triglycerides: 146 mg/dL (ref <150)
VLDL: 29 mg/dL (ref <30)

## 2015-12-07 LAB — MICROALBUMIN, URINE: Microalb, Ur: 0.2 mg/dL

## 2015-12-07 LAB — HEMOGLOBIN A1C
HEMOGLOBIN A1C: 6.6 % — AB (ref <5.7)
Mean Plasma Glucose: 143 mg/dL

## 2015-12-07 MED ORDER — NIACIN ER (ANTIHYPERLIPIDEMIC) 500 MG PO TBCR: 500.0000 mg | EXTENDED_RELEASE_TABLET | Freq: Every day | ORAL | Status: DC

## 2015-12-07 MED ORDER — FENOFIBRATE 145 MG PO TABS: ORAL_TABLET | ORAL | Status: DC

## 2015-12-07 MED ORDER — IPRATROPIUM BROMIDE 0.03 % NA SOLN: 2.0000 | Freq: Two times a day (BID) | NASAL | Status: DC

## 2015-12-07 MED ORDER — HYDROCHLOROTHIAZIDE 12.5 MG PO TABS: 12.5000 mg | ORAL_TABLET | Freq: Every day | ORAL | Status: DC

## 2015-12-07 NOTE — Patient Instructions (Addendum)
Keep walking! Let me know if the woozy feelings continue after we reduce the dose of the HCTZ.    IF you received an x-ray today, you will receive an invoice from [facility]. Please contact [facility] at [phone] with questions or concerns regarding your invoice.   IF you received labwork today, you will receive an invoice from [employer]. Please contact [facility] at [phone] with questions or concerns regarding your invoice.   Our billing staff will not be able to assist you with questions regarding bills from these companies.  You will be contacted with the lab results as soon as they are available. The fastest way to get your results is to activate your My Chart account. Instructions are located on the last page of this paperwork. If you have not heard from [place] regarding the results in 2 weeks, please contact this office.

## 2015-12-07 NOTE — Progress Notes (Signed)
Patient ID: Brad Pineda., male    DOB: Dec 11, 1975, 40 y.o.   MRN: [id number]  PCP: Haroon Shatto, PA-C  Subjective:   Chief Complaint  Patient presents with  . Follow-up  . Diabetes  . Medication Refill    HPI Presents for evaluation of diabetes, HTN and hyperlipidemia.  Overall, feels well. No adverse reactions to medications. Is exercising more. Walking his dog around the apartment complex. Thinks he gets about a mile 4 times a week. Has cut back on portions, but worries that he's not eating enough, as he has had an increase in episodes of feeling woozy or lightheaded. He is worried that there is something wrong. He "figures" that he's lost weight because of the lifestyle changes but his anxiety sometimes gets the best of him.   Review of Systems  Constitutional: Negative for activity change, appetite change, fatigue and unexpected weight change.  HENT: Negative for congestion, dental problem, ear pain, hearing loss, mouth sores, postnasal drip, rhinorrhea, sneezing, sore throat, tinnitus and trouble swallowing.   Eyes: Negative for photophobia, pain, redness and visual disturbance.  Respiratory: Negative for cough, chest tightness and shortness of breath.   Cardiovascular: Negative for chest pain, palpitations and leg swelling.  Gastrointestinal: Negative for nausea, vomiting, abdominal pain, diarrhea, constipation and blood in stool.  Genitourinary: Negative for dysuria, urgency, frequency and hematuria.  Musculoskeletal: Negative for myalgias, arthralgias, gait problem and neck stiffness.  Skin: Negative for rash.  Neurological: Positive for light-headedness (comes in waves, "just hits me," often with rapid position change, sometimes while standing; thinks he may not be eating enough, so he has a piece of candy when it happens). Negative for dizziness, speech difficulty, weakness, numbness and headaches.  Hematological: Negative for adenopathy.  Psychiatric/Behavioral:  Negative for confusion and sleep disturbance. The patient is not nervous/anxious.        Patient Active Problem List   Diagnosis Date Noted  . Glaucoma 08/24/2015  . Nonalcoholic steatohepatitis (NASH) 08/24/2015  . OSA on CPAP 12/10/2013  . Obesity (BMI 30-39.9) 05/13/2013  . Diabetes mellitus type 2, uncontrolled, with complications ([place])   . Essential hypertension, benign   . Mixed hyperlipidemia   . GERD (gastroesophageal reflux disease)   . Anxiety      Prior to Admission medications   Medication Sig Start Date End Date Taking? Authorizing Provider  ammonium lactate (LAC-HYDRIN) 12 % lotion Apply 1 application topically 2 (two) times daily 07/06/15  Yes Nori Winegar, PA-C  aspirin 81 MG tablet Take 162 mg by mouth once. Takes 3 qd   Yes Historical Provider, MD  cetirizine (ZYRTEC) 10 MG tablet Take 10 mg by mouth daily.   Yes Historical Provider, MD  dapagliflozin propanediol (FARXIGA) 5 MG TABS tablet Take 5 mg by mouth daily. 05/21/15  Yes Lelani Garnett, PA-C  fenofibrate (TRICOR) 145 MG tablet TAKE 1 TABLET (145 MG TOTAL) BY MOUTH DAILY. 11/18/15  Yes Mitsuo Budnick, PA-C  fish oil-omega-3 fatty acids 1000 MG capsule Take 1 g by mouth daily.    Yes Historical Provider, MD  glipiZIDE (GLUCOTROL XL) 10 MG 24 hr tablet TAKE 1 TABLET (10 MG TOTAL) BY MOUTH DAILY WITH BREAKFAST. 08/24/15  Yes Tylee Newby, PA-C  hydrochlorothiazide (HYDRODIURIL) 25 MG tablet TAKE 1 TABLET (25 MG TOTAL) BY MOUTH DAILY. 01/27/15  Yes Samone Guhl, PA-C  ibuprofen (ADVIL,MOTRIN) 200 MG tablet Take 200 mg by mouth every 6 (six) hours as needed.   Yes Historical Provider, MD  ipratropium (ATROVENT) 0.03 % nasal  spray Place 2 sprays into both nostrils every 12 (twelve) hours. 12/08/14  Yes Benedicto Capozzi, PA-C  lansoprazole (PREVACID) 15 MG capsule Take 15 mg by mouth daily.   Yes Historical Provider, MD  losartan (COZAAR) 100 MG tablet Take 1 tablet (100 mg total) by mouth daily. 08/24/15  Yes Farris Geiman, PA-C  metFORMIN (GLUCOPHAGE) 500 MG tablet Take 1 tablet (500 mg total) by mouth 3 (three) times daily. 11/30/15  Yes Helayna Dun, PA-C  niacin (NIASPAN) 500 MG CR tablet Take 1 tablet (500 mg total) by mouth daily. 12/08/14  Yes Latanja Lehenbauer, PA-C  PARoxetine (PAXIL) 40 MG tablet TAKE 1 TABLET (40 MG TOTAL) BY MOUTH DAILY. 11/18/15  Yes Jaxiel Kines, PA-C  pravastatin (PRAVACHOL) 40 MG tablet Take 1 tablet (40 mg total) by mouth daily. 08/24/15  Yes Tykeisha Peer, PA-C  sitaGLIPtin (JANUVIA) 100 MG tablet Take 1 tablet (100 mg total) by mouth daily. 08/24/15  Yes Kindra Bickham, PA-C     No Known Allergies     Objective:  Physical Exam  Constitutional: He is oriented to person, place, and time. He appears well-developed and well-nourished. He is active and cooperative. No distress.  BP 100/70 mmHg  Pulse 87  Temp(Src) 98.9 F (37.2 C) (Oral)  Resp 16  Ht 5' 8.75" (1.746 m)  Wt 196 lb 12.8 oz (89.268 kg)  BMI 29.28 kg/m2  SpO2 95% Weight is down 14 lbs in the past year.  HENT:  Head: Normocephalic and atraumatic.  Right Ear: Hearing normal.  Left Ear: Hearing normal.  Eyes: Conjunctivae are normal. No scleral icterus.  Neck: Normal range of motion. Neck supple. No thyromegaly present.  Cardiovascular: Normal rate, regular rhythm and normal heart sounds.   Pulses:      Radial pulses are 2+ on the right side, and 2+ on the left side.  Pulmonary/Chest: Effort normal and breath sounds normal.  Lymphadenopathy:       Head (right side): No tonsillar, no preauricular, no posterior auricular and no occipital adenopathy present.       Head (left side): No tonsillar, no preauricular, no posterior auricular and no occipital adenopathy present.    He has no cervical adenopathy.       Right: No supraclavicular adenopathy present.       Left: No supraclavicular adenopathy present.  Neurological: He is alert and oriented to person, place, and time. No sensory deficit.  Skin:  Skin is warm, dry and intact. No rash noted. No cyanosis or erythema. Nails show no clubbing.  Psychiatric: He has a normal mood and affect. His speech is normal and behavior is normal.           Assessment & Plan:   1. Uncontrolled type 2 diabetes mellitus with complication, without long-term current use of insulin ([place]) Expect improved control with the weight loss, lifestyle changes. Await A1C. - HM Diabetes Foot Exam - Comprehensive metabolic panel - Hemoglobin A1c - Microalbumin, urine  2. Essential hypertension, benign Well-controlled, perhaps too much so, as he is having episodes of lightheadedness. Reduce HCTZ to 12.5 mg daily. Consider D/C if BP remains well-controlled. Continue healthy lifestyle changes. - Comprehensive metabolic panel - hydrochlorothiazide (HYDRODIURIL) 12.5 MG tablet; Take 1 tablet (12.5 mg total) by mouth daily.  Dispense: 90 tablet; Refill: 3  3. Mixed hyperlipidemia Continue lifestyle changes and current medications, pending lab results. Would like to reduce/stop the fenofibrate. - Comprehensive metabolic panel - Lipid panel - niacin (NIASPAN) 500 MG CR tablet; Take  1 tablet (500 mg total) by mouth daily.  Dispense: 90 tablet; Refill: 3 - fenofibrate (TRICOR) 145 MG tablet; TAKE 1 TABLET (145 MG TOTAL) BY MOUTH DAILY.  Dispense: 90 tablet; Refill: 3  4. BMI 29 Encouraged continued lifestyle changes for risk reduction.  5. Allergic rhinitis, unspecified allergic rhinitis type Stable/Controlled. Continue. - ipratropium (ATROVENT) 0.03 % nasal spray; Place 2 sprays into both nostrils every 12 (twelve) hours.  Dispense: 30 mL; Refill: 12    Return in about 3 months (around 03/08/2016).     Fara Chute, PA-C Physician Assistant-Certified [facility]

## 2015-12-09 ENCOUNTER — Encounter: Payer: Self-pay | Admitting: Physician Assistant

## 2016-03-28 ENCOUNTER — Encounter: Payer: Self-pay | Admitting: Physician Assistant

## 2016-03-28 ENCOUNTER — Ambulatory Visit (INDEPENDENT_AMBULATORY_CARE_PROVIDER_SITE_OTHER): Payer: Managed Care, Other (non HMO) | Admitting: Physician Assistant

## 2016-03-28 VITALS — BP 104/72 | HR 86 | Temp 97.6°F | Resp 17 | Ht 68.0 in | Wt 202.0 lb

## 2016-03-28 DIAGNOSIS — E118 Type 2 diabetes mellitus with unspecified complications: Secondary | ICD-10-CM

## 2016-03-28 DIAGNOSIS — J309 Allergic rhinitis, unspecified: Secondary | ICD-10-CM | POA: Diagnosis not present

## 2016-03-28 DIAGNOSIS — E782 Mixed hyperlipidemia: Secondary | ICD-10-CM

## 2016-03-28 DIAGNOSIS — I10 Essential (primary) hypertension: Secondary | ICD-10-CM

## 2016-03-28 DIAGNOSIS — E1165 Type 2 diabetes mellitus with hyperglycemia: Secondary | ICD-10-CM

## 2016-03-28 DIAGNOSIS — Z6829 Body mass index (BMI) 29.0-29.9, adult: Secondary | ICD-10-CM

## 2016-03-28 DIAGNOSIS — Z23 Encounter for immunization: Secondary | ICD-10-CM

## 2016-03-28 DIAGNOSIS — F419 Anxiety disorder, unspecified: Secondary | ICD-10-CM

## 2016-03-28 DIAGNOSIS — IMO0002 Reserved for concepts with insufficient information to code with codable children: Secondary | ICD-10-CM

## 2016-03-28 LAB — COMPREHENSIVE METABOLIC PANEL
ALT: 39 U/L (ref 9–46)
AST: 26 U/L (ref 10–40)
Albumin: 4.3 g/dL (ref 3.6–5.1)
Alkaline Phosphatase: 39 U/L — ABNORMAL LOW (ref 40–115)
BUN: 16 mg/dL (ref 7–25)
CALCIUM: 9.3 mg/dL (ref 8.6–10.3)
CHLORIDE: 105 mmol/L (ref 98–110)
CO2: 27 mmol/L (ref 20–31)
Creat: 1.13 mg/dL (ref 0.60–1.35)
GLUCOSE: 156 mg/dL — AB (ref 65–99)
POTASSIUM: 3.8 mmol/L (ref 3.5–5.3)
Sodium: 140 mmol/L (ref 135–146)
Total Bilirubin: 0.5 mg/dL (ref 0.2–1.2)
Total Protein: 6.6 g/dL (ref 6.1–8.1)

## 2016-03-28 LAB — CBC WITH DIFFERENTIAL/PLATELET
BASOS PCT: 1 %
Basophils Absolute: 61 cells/uL (ref 0–200)
Eosinophils Absolute: 61 cells/uL (ref 15–500)
Eosinophils Relative: 1 %
HEMATOCRIT: 45 % (ref 38.5–50.0)
Hemoglobin: 15.3 g/dL (ref 13.2–17.1)
LYMPHS ABS: 3233 {cells}/uL (ref 850–3900)
LYMPHS PCT: 53 %
MCH: 28.8 pg (ref 27.0–33.0)
MCHC: 34 g/dL (ref 32.0–36.0)
MCV: 84.7 fL (ref 80.0–100.0)
MONO ABS: 427 {cells}/uL (ref 200–950)
MPV: 9.2 fL (ref 7.5–12.5)
Monocytes Relative: 7 %
NEUTROS ABS: 2318 {cells}/uL (ref 1500–7800)
Neutrophils Relative %: 38 %
Platelets: 277 10*3/uL (ref 140–400)
RBC: 5.31 MIL/uL (ref 4.20–5.80)
RDW: 13.9 % (ref 11.0–15.0)
WBC: 6.1 10*3/uL (ref 3.8–10.8)

## 2016-03-28 LAB — LIPID PANEL
CHOL/HDL RATIO: 4.8 ratio (ref <=5.0)
CHOLESTEROL: 115 mg/dL — AB (ref 125–200)
HDL: 24 mg/dL — AB (ref >=40)
LDL Cholesterol: 64 mg/dL (ref <130)
Triglycerides: 136 mg/dL (ref <150)
VLDL: 27 mg/dL (ref <30)

## 2016-03-28 MED ORDER — IPRATROPIUM BROMIDE 0.03 % NA SOLN: 2.0000 | Freq: Two times a day (BID) | NASAL | 12 refills | Status: DC

## 2016-03-28 NOTE — Patient Instructions (Addendum)
Your first goal is to get 150 minutes of exercise each week!   IF you received an x-ray today, you will receive an invoice from [facility]. Please contact [facility] at [phone] with questions or concerns regarding your invoice.   IF you received labwork today, you will receive an invoice from [employer]. Please contact [facility] at [phone] with questions or concerns regarding your invoice.   Our billing staff will not be able to assist you with questions regarding bills from these companies.  You will be contacted with the lab results as soon as they are available. The fastest way to get your results is to activate your My Chart account. Instructions are located on the last page of this paperwork. If you have not heard from [place] regarding the results in 2 weeks, please contact this office.     Influenza (Flu) Vaccine (Inactivated or Recombinant):  1. Why get vaccinated? Influenza ("flu") is a contagious disease that spreads around the [place] every year, usually between October and May. Flu is caused by influenza viruses, and is spread mainly by coughing, sneezing, and close contact. Anyone can get flu. Flu strikes suddenly and can last several days. Symptoms vary by age, but can include:  fever/chills  sore throat  muscle aches  fatigue  cough  headache  runny or stuffy nose Flu can also lead to pneumonia and blood infections, and cause diarrhea and seizures in children. If you have a medical condition, such as heart or lung disease, flu can make it worse. Flu is more dangerous for some people. Infants and young children, people 70 years of age and older, pregnant women, and people with certain health conditions or a weakened immune system are at greatest risk. Each year thousands of people in the [place] die from flu, and many more are hospitalized. Flu vaccine can:  keep you from getting flu,  make flu less  severe if you do get it, and  keep you from spreading flu to your family and other people. 2. Inactivated and recombinant flu vaccines A dose of flu vaccine is recommended every flu season. Children 6 months through 84 years of age may need two doses during the same flu season. Everyone else needs only one dose each flu season. Some inactivated flu vaccines contain a very small amount of a mercury-based preservative called thimerosal. Studies have not shown thimerosal in vaccines to be harmful, but flu vaccines that do not contain thimerosal are available. There is no live flu virus in flu shots. They cannot cause the flu. There are many flu viruses, and they are always changing. Each year a new flu vaccine is made to protect against three or four viruses that are likely to cause disease in the upcoming flu season. But even when the vaccine doesn't exactly match these viruses, it may still provide some protection. Flu vaccine cannot prevent:  flu that is caused by a virus not covered by the vaccine, or  illnesses that look like flu but are not. It takes about 2 weeks for protection to develop after vaccination, and protection lasts through the flu season. 3. Some people should not get this vaccine Tell the person who is giving you the vaccine:  If you have any severe, life-threatening allergies. If you ever had a life-threatening allergic reaction after a dose of flu vaccine, or have a severe allergy to any part of this vaccine, you may be advised not to get vaccinated. Most, but not  all, types of flu vaccine contain a small amount of egg protein.  If you ever had Guillain-Barre Syndrome (also called GBS). Some people with a history of GBS should not get this vaccine. This should be discussed with your doctor.  If you are not feeling well. It is usually okay to get flu vaccine when you have a mild illness, but you might be asked to come back when you feel better. 4. Risks of a vaccine  reaction With any medicine, including vaccines, there is a chance of reactions. These are usually mild and go away on their own, but serious reactions are also possible. Most people who get a flu shot do not have any problems with it. Minor problems following a flu shot include:  soreness, redness, or swelling where the shot was given  hoarseness  sore, red or itchy eyes  cough  fever  aches  headache  itching  fatigue If these problems occur, they usually begin soon after the shot and last 1 or 2 days. More serious problems following a flu shot can include the following:  There may be a small increased risk of Guillain-Barre Syndrome (GBS) after inactivated flu vaccine. This risk has been estimated at 1 or 2 additional cases per million people vaccinated. This is much lower than the risk of severe complications from flu, which can be prevented by flu vaccine.  Young children who get the flu shot along with pneumococcal vaccine (PCV13) and/or DTaP vaccine at the same time might be slightly more likely to have a seizure caused by fever. Ask your doctor for more information. Tell your doctor if a child who is getting flu vaccine has ever had a seizure. Problems that could happen after any injected vaccine:  People sometimes faint after a medical procedure, including vaccination. Sitting or lying down for about 15 minutes can help prevent fainting, and injuries caused by a fall. Tell your doctor if you feel dizzy, or have vision changes or ringing in the ears.  Some people get severe pain in the shoulder and have difficulty moving the arm where a shot was given. This happens very rarely.  Any medication can cause a severe allergic reaction. Such reactions from a vaccine are very rare, estimated at about 1 in a million doses, and would happen within a few minutes to a few hours after the vaccination. As with any medicine, there is a very remote chance of a vaccine causing a serious  injury or death. The safety of vaccines is always being monitored. For more information, visit: http://www.aguilar.org/ 5. What if there is a serious reaction? What should I look for?  Look for anything that concerns you, such as signs of a severe allergic reaction, very high fever, or unusual behavior. Signs of a severe allergic reaction can include hives, swelling of the face and throat, difficulty breathing, a fast heartbeat, dizziness, and weakness. These would start a few minutes to a few hours after the vaccination. What should I do?  If you think it is a severe allergic reaction or other emergency that can't wait, call 9-1-1 and get the person to the nearest hospital. Otherwise, call your doctor.  Reactions should be reported to the Vaccine Adverse Event Reporting System (VAERS). Your doctor should file this report, or you can do it yourself through the VAERS web site at www.vaers.SamedayNews.es, or by calling (503)320-4531. VAERS does not give medical advice. 6. The National Vaccine Injury Compensation Program The [employer] Vaccine Injury Compensation Program ([place]) is  a federal program that was created to compensate people who may have been injured by certain vaccines. Persons who believe they may have been injured by a vaccine can learn about the program and about filing a claim by calling 9516044513 or visiting the [place] website at GoldCloset.com.ee. There is a time limit to file a claim for compensation. 7. How can I learn more?  Ask your healthcare provider. He or she can give you the vaccine package insert or suggest other sources of information.  Call your local or state health department.  Contact the Centers for Disease Control and Prevention (CDC):  Call 979-819-7155 (1-800-CDC-INFO) or  Visit CDC's website at https://gibson.com/ Vaccine Information Statement Inactivated Influenza Vaccine (03/13/2014)   This information is not intended to replace advice  given to you by your health care provider. Make sure you discuss any questions you have with your health care provider.   Document Released: 05/18/2006 Document Revised: 08/14/2014 Document Reviewed: 03/16/2014 Elsevier Interactive Patient Education [employer].

## 2016-03-28 NOTE — Progress Notes (Signed)
Patient ID: Brad Pineda., male    DOB: January 30, 1976, 40 y.o.   MRN: [id number]  PCP: Harrison Mons, PA-C  Subjective:   Chief Complaint  Patient presents with  . Follow-up    diabetes    HPI Presents for evaluation of diabetes, HTN and hyperlipidemia.  Overall he's doing well.  Tolerating medications without difficulty. Doesn't check glucose or BP at home. Doesn't really pay attention to making intentional eating choices.  Not really exercising. Gets off work at 10:30 pm. Exercising after makes it difficult to fall asleep. Has not been successful at getting up earlier to exercise. He and his wife work opposite shifts.  Review of Systems  Constitutional: Negative for activity change, appetite change, fatigue and unexpected weight change.  HENT: Negative for congestion, dental problem, ear pain, hearing loss, mouth sores, postnasal drip, rhinorrhea, sneezing, sore throat, tinnitus and trouble swallowing.   Eyes: Negative for photophobia, pain, redness and visual disturbance.  Respiratory: Negative for cough, chest tightness and shortness of breath.   Cardiovascular: Negative for chest pain, palpitations and leg swelling.  Gastrointestinal: Negative for abdominal pain, blood in stool, constipation, diarrhea, nausea and vomiting.  Genitourinary: Negative for dysuria, frequency, hematuria and urgency.  Musculoskeletal: Negative for arthralgias, gait problem, myalgias and neck stiffness.  Skin: Negative for rash.  Neurological: Negative for dizziness, speech difficulty, weakness, light-headedness, numbness and headaches.  Hematological: Negative for adenopathy.  Psychiatric/Behavioral: Negative for confusion and sleep disturbance. The patient is not nervous/anxious.        Patient Active Problem List   Diagnosis Date Noted  . Glaucoma 08/24/2015  . Nonalcoholic steatohepatitis (NASH) 08/24/2015  . OSA on CPAP 12/10/2013  . BMI 29.0-29.9,adult 05/13/2013  . Diabetes  mellitus type 2, uncontrolled, with complications ([place])   . Essential hypertension, benign   . Mixed hyperlipidemia   . GERD (gastroesophageal reflux disease)   . Anxiety      Prior to Admission medications   Medication Sig Start Date End Date Taking? Authorizing Provider  ammonium lactate (LAC-HYDRIN) 12 % lotion Apply 1 application topically 2 (two) times daily 07/06/15  Yes Robson Trickey, PA-C  aspirin 81 MG tablet Take 162 mg by mouth once. Takes 3 qd   Yes Historical Provider, MD  cetirizine (ZYRTEC) 10 MG tablet Take 10 mg by mouth daily.   Yes Historical Provider, MD  dapagliflozin propanediol (FARXIGA) 5 MG TABS tablet Take 5 mg by mouth daily. 05/21/15  Yes Xayden Linsey, PA-C  fenofibrate (TRICOR) 145 MG tablet TAKE 1 TABLET (145 MG TOTAL) BY MOUTH DAILY. 12/07/15  Yes Rakwon Letourneau, PA-C  fish oil-omega-3 fatty acids 1000 MG capsule Take 1 g by mouth daily.    Yes Historical Provider, MD  glipiZIDE (GLUCOTROL XL) 10 MG 24 hr tablet TAKE 1 TABLET (10 MG TOTAL) BY MOUTH DAILY WITH BREAKFAST. 08/24/15  Yes Saadiq Poche, PA-C  hydrochlorothiazide (HYDRODIURIL) 12.5 MG tablet Take 1 tablet (12.5 mg total) by mouth daily. 12/07/15  Yes Tyse Auriemma, PA-C  ibuprofen (ADVIL,MOTRIN) 200 MG tablet Take 200 mg by mouth every 6 (six) hours as needed.   Yes Historical Provider, MD  ipratropium (ATROVENT) 0.03 % nasal spray Place 2 sprays into both nostrils every 12 (twelve) hours. 12/07/15  Yes Maila Dukes, PA-C  lansoprazole (PREVACID) 15 MG capsule Take 15 mg by mouth daily.   Yes Historical Provider, MD  losartan (COZAAR) 100 MG tablet Take 1 tablet (100 mg total) by mouth daily. 08/24/15  Yes Mafalda Mcginniss, PA-C  metFORMIN (  GLUCOPHAGE) 500 MG tablet Take 1 tablet (500 mg total) by mouth 3 (three) times daily. 11/30/15  Yes Johany Hansman, PA-C  niacin (NIASPAN) 500 MG CR tablet Take 1 tablet (500 mg total) by mouth daily. 12/07/15  Yes Itzael Liptak, PA-C  PARoxetine (PAXIL) 40 MG tablet  TAKE 1 TABLET (40 MG TOTAL) BY MOUTH DAILY. 11/18/15  Yes Krikor Willet, PA-C  pravastatin (PRAVACHOL) 40 MG tablet Take 1 tablet (40 mg total) by mouth daily. 08/24/15  Yes Cambrey Lupi, PA-C  sitaGLIPtin (JANUVIA) 100 MG tablet Take 1 tablet (100 mg total) by mouth daily. 08/24/15  Yes Tatjana Turcott, PA-C     No Known Allergies     Objective:  Physical Exam  Constitutional: He is oriented to person, place, and time. He appears well-developed and well-nourished. He is active and cooperative. No distress.  BP 104/72 (BP Location: Right Arm, Patient Position: Sitting, Cuff Size: Large)   Pulse 86   Temp 97.6 F (36.4 C) (Oral)   Resp 17   Ht 5' 8"  (1.727 m)   Wt 202 lb (91.6 kg)   SpO2 95%   BMI 30.71 kg/m   HENT:  Head: Normocephalic and atraumatic.  Right Ear: Hearing normal.  Left Ear: Hearing normal.  Eyes: Conjunctivae are normal. No scleral icterus.  Neck: Normal range of motion. Neck supple. No thyromegaly present.  Cardiovascular: Normal rate, regular rhythm and normal heart sounds.   Pulses:      Radial pulses are 2+ on the right side, and 2+ on the left side.  Pulmonary/Chest: Effort normal and breath sounds normal.  Lymphadenopathy:       Head (right side): No tonsillar, no preauricular, no posterior auricular and no occipital adenopathy present.       Head (left side): No tonsillar, no preauricular, no posterior auricular and no occipital adenopathy present.    He has no cervical adenopathy.       Right: No supraclavicular adenopathy present.       Left: No supraclavicular adenopathy present.  Neurological: He is alert and oriented to person, place, and time. No sensory deficit.  Skin: Skin is warm, dry and intact. No rash noted. No cyanosis or erythema. Nails show no clubbing.  Psychiatric: He has a normal mood and affect. His speech is normal and behavior is normal.           Assessment & Plan:   1. Uncontrolled type 2 diabetes mellitus with  complication, without long-term current use of insulin ([place]) Await labs. Adjust regimen as indicated by results. - Hemoglobin A1c - Comprehensive metabolic panel  2. Essential hypertension, benign Well-Controlled. Stable. Continue current treatment. - CBC with Differential/Platelet - Comprehensive metabolic panel  3. Mixed hyperlipidemia Await labs. Adjust regimen as indicated by results. - Comprehensive metabolic panel - Lipid panel  4. BMI 29.0-29.9,adult Healthy eating, regular exercise. Goal of 150 minutes/week.  5. Anxiety Controlled. Stable. Continue current treatment.  6. Allergic rhinitis, unspecified allergic rhinitis type Controlled. Stable. Continue current treatment. - ipratropium (ATROVENT) 0.03 % nasal spray; Place 2 sprays into both nostrils every 12 (twelve) hours.  Dispense: 30 mL; Refill: 12  7. Need for influenza vaccination - Flu Vaccine QUAD 36+ mos IM   Return in about 3 months (around 06/28/2016) for re-evaluation of diabetes, blood pressure, cholesterol.    Fara Chute, PA-C Physician Assistant-Certified [facility]

## 2016-03-29 LAB — HEMOGLOBIN A1C
HEMOGLOBIN A1C: 6.6 % — AB (ref <5.7)
MEAN PLASMA GLUCOSE: 143 mg/dL

## 2016-03-31 ENCOUNTER — Encounter: Payer: Self-pay | Admitting: Physician Assistant

## 2016-04-20 ENCOUNTER — Other Ambulatory Visit: Payer: Self-pay

## 2016-04-20 DIAGNOSIS — J309 Allergic rhinitis, unspecified: Secondary | ICD-10-CM

## 2016-04-20 MED ORDER — IPRATROPIUM BROMIDE 0.03 % NA SOLN: 2.0000 | Freq: Two times a day (BID) | NASAL | 3 refills | Status: DC

## 2016-04-20 NOTE — Telephone Encounter (Signed)
Pharmacy requested 90 day supply chaned order to dispense 41m, with 3 refills

## 2016-05-02 ENCOUNTER — Ambulatory Visit (INDEPENDENT_AMBULATORY_CARE_PROVIDER_SITE_OTHER): Payer: Managed Care, Other (non HMO) | Admitting: Family Medicine

## 2016-05-02 VITALS — BP 122/72 | HR 84 | Temp 98.3°F | Resp 17 | Ht 67.5 in | Wt 204.0 lb

## 2016-05-02 DIAGNOSIS — G4733 Obstructive sleep apnea (adult) (pediatric): Secondary | ICD-10-CM | POA: Diagnosis not present

## 2016-05-02 DIAGNOSIS — E118 Type 2 diabetes mellitus with unspecified complications: Secondary | ICD-10-CM

## 2016-05-02 DIAGNOSIS — E782 Mixed hyperlipidemia: Secondary | ICD-10-CM | POA: Diagnosis not present

## 2016-05-02 DIAGNOSIS — I10 Essential (primary) hypertension: Secondary | ICD-10-CM

## 2016-05-02 DIAGNOSIS — E669 Obesity, unspecified: Secondary | ICD-10-CM | POA: Diagnosis not present

## 2016-05-02 DIAGNOSIS — Z0000 Encounter for general adult medical examination without abnormal findings: Secondary | ICD-10-CM

## 2016-05-02 DIAGNOSIS — IMO0002 Reserved for concepts with insufficient information to code with codable children: Secondary | ICD-10-CM

## 2016-05-02 DIAGNOSIS — E1165 Type 2 diabetes mellitus with hyperglycemia: Secondary | ICD-10-CM | POA: Diagnosis not present

## 2016-05-02 LAB — POCT CBC
Granulocyte percent: 40.6 %G (ref 37–80)
HCT, POC: 41.6 % — AB (ref 43.5–53.7)
Hemoglobin: 14.4 g/dL (ref 14.1–18.1)
Lymph, poc: 2.4 (ref 0.6–3.4)
MCH, POC: 29 pg (ref 27–31.2)
MCHC: 34.6 g/dL (ref 31.8–35.4)
MCV: 83.7 fL (ref 80–97)
MID (cbc): 0.4 (ref 0–0.9)
MPV: 6.3 fL (ref 0–99.8)
POC Granulocyte: 1.9 — AB (ref 2–6.9)
POC LYMPH PERCENT: 50.8 %L — AB (ref 10–50)
POC MID %: 8.6 %M (ref 0–12)
Platelet Count, POC: 230 10*3/uL (ref 142–424)
RBC: 4.96 M/uL (ref 4.69–6.13)
RDW, POC: 13.7 %
WBC: 4.8 10*3/uL (ref 4.6–10.2)

## 2016-05-02 LAB — POCT URINALYSIS DIP (MANUAL ENTRY)
Bilirubin, UA: NEGATIVE
Blood, UA: NEGATIVE
Glucose, UA: >=1000 — AB
Ketones, POC UA: NEGATIVE
Leukocytes, UA: NEGATIVE
Nitrite, UA: NEGATIVE
Protein Ur, POC: NEGATIVE
Spec Grav, UA: 1.025
Urobilinogen, UA: 1
pH, UA: 5.5

## 2016-05-02 LAB — MICROALBUMIN, URINE: Microalb, Ur: 0.8 mg/dL

## 2016-05-02 NOTE — Patient Instructions (Addendum)
Try to get more exercise. Also try to get the weight down. Each of these goals will improve your health and life expectancy particularly because of the diabetes, hypertension, and sleep apnea. If you would like to discuss having your CPAP machine updated, please return and discuss with Brad Pineda   IF you received an x-ray today, you will receive an invoice from [facility]. Please contact [facility] at [phone] with questions or concerns regarding your invoice.   IF you received labwork today, you will receive an invoice from [employer]. Please contact [facility] at [phone] with questions or concerns regarding your invoice.   Our billing staff will not be able to assist you with questions regarding bills from these companies.  You will be contacted with the lab results as soon as they are available. The fastest way to get your results is to activate your My Chart account. Instructions are located on the last page of this paperwork. If you have not heard from [place] regarding the results in 2 weeks, please contact this office.     Health Maintenance, Male A healthy lifestyle and preventative care can promote health and wellness.  Maintain regular health, dental, and eye exams.  Eat a healthy diet. Foods like vegetables, fruits, whole grains, low-fat dairy products, and lean protein foods contain the nutrients you need and are low in calories. Decrease your intake of foods high in solid fats, added sugars, and salt. Get information about a proper diet from your health care provider, if necessary.  Regular physical exercise is one of the most important things you can do for your health. Most adults should get at least 150 minutes of moderate-intensity exercise (any activity that increases your heart rate and causes you to sweat) each week. In addition, most adults need muscle-strengthening exercises on 2 or more days a week.   Maintain a  healthy weight. The body mass index (BMI) is a screening tool to identify possible weight problems. It provides an estimate of body fat based on height and weight. Your health care provider can find your BMI and can help you achieve or maintain a healthy weight. For males 20 years and older:  A BMI below 18.5 is considered underweight.  A BMI of 18.5 to 24.9 is normal.  A BMI of 25 to 29.9 is considered overweight.  A BMI of 30 and above is considered obese.  Maintain normal blood lipids and cholesterol by exercising and minimizing your intake of saturated fat. Eat a balanced diet with plenty of fruits and vegetables. Blood tests for lipids and cholesterol should begin at age 38 and be repeated every 5 years. If your lipid or cholesterol levels are high, you are over age 45, or you are at high risk for heart disease, you may need your cholesterol levels checked more frequently.Ongoing high lipid and cholesterol levels should be treated with medicines if diet and exercise are not working.  If you smoke, find out from your health care provider how to quit. If you do not use tobacco, do not start.  Lung cancer screening is recommended for adults aged 31-80 years who are at high risk for developing lung cancer because of a history of smoking. A yearly low-dose CT scan of the lungs is recommended for people who have at least a 30-pack-year history of smoking and are current smokers or have quit within the past 15 years. A pack year of smoking is smoking an average of 1 pack of cigarettes  a day for 1 year (for example, a 30-pack-year history of smoking could mean smoking 1 pack a day for 30 years or 2 packs a day for 15 years). Yearly screening should continue until the smoker has stopped smoking for at least 15 years. Yearly screening should be stopped for people who develop a health problem that would prevent them from having lung cancer treatment.  If you choose to drink alcohol, do not have more than  2 drinks per day. One drink is considered to be 12 oz (360 mL) of beer, 5 oz (150 mL) of wine, or 1.5 oz (45 mL) of liquor.  Avoid the use of street drugs. Do not share needles with anyone. Ask for help if you need support or instructions about stopping the use of drugs.  High blood pressure causes heart disease and increases the risk of stroke. High blood pressure is more likely to develop in:  People who have blood pressure in the end of the normal range (100-139/85-89 mm Hg).  People who are overweight or obese.  People who are African American.  If you are 21-83 years of age, have your blood pressure checked every 3-5 years. If you are 21 years of age or older, have your blood pressure checked every year. You should have your blood pressure measured twice--once when you are at a hospital or clinic, and once when you are not at a hospital or clinic. Record the average of the two measurements. To check your blood pressure when you are not at a hospital or clinic, you can use:  An automated blood pressure machine at a pharmacy.  A home blood pressure monitor.  If you are 46-34 years old, ask your health care provider if you should take aspirin to prevent heart disease.  Diabetes screening involves taking a blood sample to check your fasting blood sugar level. This should be done once every 3 years after age 77 if you are at a normal weight and without risk factors for diabetes. Testing should be considered at a younger age or be carried out more frequently if you are overweight and have at least 1 risk factor for diabetes.  Colorectal cancer can be detected and often prevented. Most routine colorectal cancer screening begins at the age of 5 and continues through age 94. However, your health care provider may recommend screening at an earlier age if you have risk factors for colon cancer. On a yearly basis, your health care provider may provide home test kits to check for hidden blood in the  stool. A small camera at the end of a tube may be used to directly examine the colon (sigmoidoscopy or colonoscopy) to detect the earliest forms of colorectal cancer. Talk to your health care provider about this at age 63 when routine screening begins. A direct exam of the colon should be repeated every 5-10 years through age 92, unless early forms of precancerous polyps or small growths are found.  People who are at an increased risk for hepatitis B should be screened for this virus. You are considered at high risk for hepatitis B if:  You were born in a country where hepatitis B occurs often. Talk with your health care provider about which countries are considered high risk.  Your parents were born in a high-risk country and you have not received a shot to protect against hepatitis B (hepatitis B vaccine).  You have HIV or AIDS.  You use needles to inject street drugs.  You  live with, or have sex with, someone who has hepatitis B.  You are a man who has sex with other men (MSM).  You get hemodialysis treatment.  You take certain medicines for conditions like cancer, organ transplantation, and autoimmune conditions.  Hepatitis C blood testing is recommended for all people born from [birth year] through 1967 and any individual with known risk factors for hepatitis C.  Healthy men should no longer receive prostate-specific antigen (PSA) blood tests as part of routine cancer screening. Talk to your health care provider about prostate cancer screening.  Testicular cancer screening is not recommended for adolescents or adult males who have no symptoms. Screening includes self-exam, a health care provider exam, and other screening tests. Consult with your health care provider about any symptoms you have or any concerns you have about testicular cancer.  Practice safe sex. Use condoms and avoid high-risk sexual practices to reduce the spread of sexually transmitted infections (STIs).  You should be  screened for STIs, including gonorrhea and chlamydia if:  You are sexually active and are younger than 24 years.  You are older than 24 years, and your health care provider tells you that you are at risk for this type of infection.  Your sexual activity has changed since you were last screened, and you are at an increased risk for chlamydia or gonorrhea. Ask your health care provider if you are at risk.  If you are at risk of being infected with HIV, it is recommended that you take a prescription medicine daily to prevent HIV infection. This is called pre-exposure prophylaxis (PrEP). You are considered at risk if:  You are a man who has sex with other men (MSM).  You are a heterosexual man who is sexually active with multiple partners.  You take drugs by injection.  You are sexually active with a partner who has HIV.  Talk with your health care provider about whether you are at high risk of being infected with HIV. If you choose to begin PrEP, you should first be tested for HIV. You should then be tested every 3 months for as long as you are taking PrEP.  Use sunscreen. Apply sunscreen liberally and repeatedly throughout the day. You should seek shade when your shadow is shorter than you. Protect yourself by wearing long sleeves, pants, a wide-brimmed hat, and sunglasses year round whenever you are outdoors.  Tell your health care provider of new moles or changes in moles, especially if there is a change in shape or color. Also, tell your health care provider if a mole is larger than the size of a pencil eraser.  A one-time screening for abdominal aortic aneurysm (AAA) and surgical repair of large AAAs by ultrasound is recommended for men aged 94-75 years who are current or former smokers.  Stay current with your vaccines (immunizations).   This information is not intended to replace advice given to you by your health care provider. Make sure you discuss any questions you have with your  health care provider.   Document Released: 01/20/2008 Document Revised: 08/14/2014 Document Reviewed: 12/19/2010 Elsevier Interactive Patient Education [employer].

## 2016-05-02 NOTE — Progress Notes (Addendum)
Patient ID: Brad Pineda. MRN: [id number], DOB: [birth year] 40 y.o. Date of Encounter: 05/02/2016, 11:30 AM  Primary Physician: Brad Mons, PA-C  Chief Complaint: Physical (CPE)  HPI: 40 y.o. y/o male with history noted below here for CPE.  [name] with diabetes, type II, hypertension, hyperlipidemia, GERD, anxiety, obstructive sleep apnea, glaucoma, and fatty liver disease.  He has no complaints at this time.  Patient has stopped using his CPAP machine for the last 2 years after he broke his ankle and had surgical repair. Attempts interfere with his sleeping. He does have difficult a sleeping and in general does not feel refreshed after waking up.  He's had diabetes for 8 years or so. He is a strong family history of diabetes with his sister and mother and father all having had diabetes. He knows he needs to exercise, but he's not been doing this. He's also let his weight increase. He does have a treadmill home and indicates that he will try to do better.  He does not check his sugar at home. He does not need any refills. He's had no difficulty with lightheadedness, polyuria, polydipsia or polyphagia. He normally sees Brad Pineda for his medical care. He says that he is good about coming in every 3 months to have his A1c checked  He is here primarily because he needs his cholesterol checked for his work [employer]).  Patient already rec'd his flu shot at work  Review of Systems: Consitutional: No fever, chills, fatigue, night sweats, lymphadenopathy, or weight changes. Eyes: No visual changes, eye redness, or discharge. ENT/Mouth: Ears: No otalgia, tinnitus, hearing loss, discharge. Nose: No congestion, rhinorrhea, sinus pain, or epistaxis. Throat: No sore throat, post nasal drip, or teeth pain. Cardiovascular: No CP, palpitations, diaphoresis, DOE, edema, orthopnea, PND. Respiratory: No cough, hemoptysis, SOB, or wheezing. Gastrointestinal: No  anorexia, dysphagia, reflux, pain, nausea, vomiting, hematemesis, diarrhea, constipation, BRBPR, or melena. Genitourinary: No dysuria, frequency, urgency, hematuria, incontinence, nocturia, decreased urinary stream, discharge, impotence, or testicular pain/masses. Musculoskeletal: No decreased ROM, myalgias, stiffness, joint swelling, or weakness. Skin: No rash, erythema, lesion changes, pain, warmth, jaundice, or pruritis. Neurological: No headache, dizziness, syncope, seizures, tremors, memory loss, coordination problems, or paresthesias. Psychological: No anxiety, depression, hallucinations, SI/HI. Endocrine: No fatigue, polydipsia, polyphagia, polyuria, or known diabetes. All other systems were reviewed and are otherwise negative.  Past Medical History:  Diagnosis Date  . Acute meniscal tear of left knee 2007  . Acute meniscal tear of left knee 2007  . Anxiety   . Depression   . Diabetes mellitus type 2, noninsulin dependent ([place])   . Family history of anesthesia complication    states father is hard to wake up post-op  . GERD (gastroesophageal reflux disease)   . Hyperlipidemia   . Hypertension    under control with meds., has been on med. since 2009  . Seasonal allergies   . Sleep apnea    uses CPAP nightly  . Trimalleolar fracture of left ankle 02/06/2014     Past Surgical History:  Procedure Laterality Date  . FOOT SURGERY    . ORIF ANKLE FRACTURE Left 02/10/2014   Procedure: LEFT ANKLE: FRACTURE OPEN TREATMENT TRIMALLEOLAR ANKLE INCLUDES INTERNAL FIXATION WITH FIXATION OF POSTERIOR LIP;  Surgeon: Brad Butters, MD;  Location: [facility];  Service: Orthopedics;  Laterality: Left;    Home Meds:  Prior to Admission medications   Medication Sig Start Date End Date Taking? Authorizing Provider  ammonium  lactate (LAC-HYDRIN) 12 % lotion Apply 1 application topically 2 (two) times daily 07/06/15  Yes Brad Jeffery, PA-C  aspirin 81 MG tablet Take 162 mg by  mouth once. Takes 3 qd   Yes Historical Provider, MD  cetirizine (ZYRTEC) 10 MG tablet Take 10 mg by mouth daily.   Yes Historical Provider, MD  dapagliflozin propanediol (FARXIGA) 5 MG TABS tablet Take 5 mg by mouth daily. 05/21/15  Yes Brad Jeffery, PA-C  fenofibrate (TRICOR) 145 MG tablet TAKE 1 TABLET (145 MG TOTAL) BY MOUTH DAILY. 12/07/15  Yes Brad Jeffery, PA-C  fish oil-omega-3 fatty acids 1000 MG capsule Take 1 g by mouth daily.    Yes Historical Provider, MD  glipiZIDE (GLUCOTROL XL) 10 MG 24 hr tablet TAKE 1 TABLET (10 MG TOTAL) BY MOUTH DAILY WITH BREAKFAST. 08/24/15  Yes Brad Jeffery, PA-C  hydrochlorothiazide (HYDRODIURIL) 12.5 MG tablet Take 1 tablet (12.5 mg total) by mouth daily. 12/07/15  Yes Brad Jeffery, PA-C  ibuprofen (ADVIL,MOTRIN) 200 MG tablet Take 200 mg by mouth every 6 (six) hours as needed.   Yes Historical Provider, MD  ipratropium (ATROVENT) 0.03 % nasal spray Place 2 sprays into both nostrils every 12 (twelve) hours. 04/20/16  Yes Brad Jeffery, PA-C  lansoprazole (PREVACID) 15 MG capsule Take 15 mg by mouth daily.   Yes Historical Provider, MD  losartan (COZAAR) 100 MG tablet Take 1 tablet (100 mg total) by mouth daily. 08/24/15  Yes Brad Jeffery, PA-C  metFORMIN (GLUCOPHAGE) 500 MG tablet Take 1 tablet (500 mg total) by mouth 3 (three) times daily. 11/30/15  Yes Brad Jeffery, PA-C  niacin (NIASPAN) 500 MG CR tablet Take 1 tablet (500 mg total) by mouth daily. 12/07/15  Yes Brad Jeffery, PA-C  PARoxetine (PAXIL) 40 MG tablet TAKE 1 TABLET (40 MG TOTAL) BY MOUTH DAILY. 11/18/15  Yes Brad Jeffery, PA-C  pravastatin (PRAVACHOL) 40 MG tablet Take 1 tablet (40 mg total) by mouth daily. 08/24/15  Yes Brad Jeffery, PA-C  sitaGLIPtin (JANUVIA) 100 MG tablet Take 1 tablet (100 mg total) by mouth daily. 08/24/15  Yes Brad Mons, PA-C    Allergies: No Known Allergies  Social History   Social History  . Marital status: Married    Spouse name: Brad Pineda  . Number  of children: 0  . Years of education: 61   Occupational History  . Hardware Associate [employer]   Social History Main Topics  . Smoking status: Never Smoker  . Smokeless tobacco: Never Used  . Alcohol use No  . Drug use: No  . Sexual activity: Yes    Partners: Female   Other Topics Concern  . Not on file   Social History Narrative   Patient is married since 12/2010(Brad Pineda) and Lives with his wife.   Patient is working full-time.   Patient has a BA degree.   Patient is right-handed.   Patient drinks two cups of soda and tea occasionally.    Family History  Problem Relation Age of Onset  . Hypertension Mother   . Diabetes Mother   . Kidney disease Father     history of kidney stones  . Anesthesia problems Father     hard to wake up post-op    Physical Exam: Blood pressure 122/72, pulse 84, temperature 98.3 F (36.8 C), temperature source Oral, resp. rate 17, height 5' 7.5" (1.715 m), weight 204 lb (92.5 kg), SpO2 95 %.  BP Readings from Last 3 Encounters:  05/02/16 122/72  03/28/16 104/72  12/07/15  100/70   General: Well developed, well nourished, in no acute distress. HEENT: Normocephalic, atraumatic. Conjunctiva pink, sclera non-icteric. Pupils 2 mm constricting to 1 mm, round, regular, and equally reactive to light and accomodation. EOMI.  Fundi benign   Internal auditory canal clear. TMs with good cone of light and without pathology. Nasal mucosa pink. Nares are without discharge. No sinus tenderness. Oral mucosa pink. Dentition good. Pharynx without exudate.    Neck: Supple. Trachea midline. No thyromegaly. Full ROM. No lymphadenopathy. Lungs: Clear to auscultation bilaterally without wheezes, rales, or rhonchi. Breathing is of normal effort and unlabored. Cardiovascular: RRR with S1 S2. No murmurs, rubs, or gallops appreciated. Distal pulses 2+ symmetrically. No carotid or abdominal bruits Abdomen: Soft, non-tender, non-distended with normoactive bowel  sounds. No hepatosplenomegaly or masses. No rebound/guarding. No CVA tenderness. Without hernias.   Genitourinary:  circumcised male. No penile lesions. Testes descended bilaterally, and smooth without tenderness or masses.  Musculoskeletal: Full range of motion and 5/5 strength throughout. Without swelling, atrophy, tenderness, crepitus, or warmth. Extremities without clubbing, cyanosis, or edema. Calves supple. Skin: Warm and moist without erythema, ecchymosis, wounds, or rash. Normal microfilament test Scar left medial malleolus from surgery. Neuro: A+Ox3. CN II-XII grossly intact. Moves all extremities spontaneously. Full sensation throughout. Normal gait. DTR 2+ throughout upper and lower extremities.  Psych:  Responds to questions appropriately with a normal affect.   Lab Results  Component Value Date   CHOL 115 (L) 03/28/2016   CHOL 128 12/07/2015   CHOL 113 (L) 08/24/2015   Lab Results  Component Value Date   HDL 24 (L) 03/28/2016   HDL 25 (L) 12/07/2015   HDL 24 (L) 08/24/2015   Lab Results  Component Value Date   LDLCALC 64 03/28/2016   LDLCALC 74 12/07/2015   LDLCALC 67 08/24/2015   Lab Results  Component Value Date   TRIG 136 03/28/2016   TRIG 146 12/07/2015   TRIG 112 08/24/2015   Lab Results  Component Value Date   CHOLHDL 4.8 03/28/2016   CHOLHDL 5.1 (H) 12/07/2015   CHOLHDL 4.7 08/24/2015   No results found for: LDLDIRECT  Results for orders placed or performed in visit on 03/28/16  CBC with Differential/Platelet  Result Value Ref Range   WBC 6.1 3.8 - 10.8 K/uL   RBC 5.31 4.20 - 5.80 MIL/uL   Hemoglobin 15.3 13.2 - 17.1 g/dL   HCT 45.0 38.5 - 50.0 %   MCV 84.7 80.0 - 100.0 fL   MCH 28.8 27.0 - 33.0 pg   MCHC 34.0 32.0 - 36.0 g/dL   RDW 13.9 11.0 - 15.0 %   Platelets 277 140 - 400 K/uL   MPV 9.2 7.5 - 12.5 fL   Neutro Abs 2,318 1,500 - 7,800 cells/uL   Lymphs Abs 3,233 850 - 3,900 cells/uL   Monocytes Absolute 427 200 - 950 cells/uL    Eosinophils Absolute 61 15 - 500 cells/uL   Basophils Absolute 61 0 - 200 cells/uL   Neutrophils Relative % 38 %   Lymphocytes Relative 53 %   Monocytes Relative 7 %   Eosinophils Relative 1 %   Basophils Relative 1 %   Smear Review Criteria for review not met   Hemoglobin A1c  Result Value Ref Range   Hgb A1c MFr Bld 6.6 (H) <5.7 %   Mean Plasma Glucose 143 mg/dL  Comprehensive metabolic panel  Result Value Ref Range   Sodium 140 135 - 146 mmol/L   Potassium 3.8 3.5 -  5.3 mmol/L   Chloride 105 98 - 110 mmol/L   CO2 27 20 - 31 mmol/L   Glucose, Bld 156 (H) 65 - 99 mg/dL   BUN 16 7 - 25 mg/dL   Creat 1.13 0.60 - 1.35 mg/dL   Total Bilirubin 0.5 0.2 - 1.2 mg/dL   Alkaline Phosphatase 39 (L) 40 - 115 U/L   AST 26 10 - 40 U/L   ALT 39 9 - 46 U/L   Total Protein 6.6 6.1 - 8.1 g/dL   Albumin 4.3 3.6 - 5.1 g/dL   Calcium 9.3 8.6 - 10.3 mg/dL  Lipid panel  Result Value Ref Range   Cholesterol 115 (L) 125 - 200 mg/dL   Triglycerides 136 <150 mg/dL   HDL 24 (L) >=40 mg/dL   Total CHOL/HDL Ratio 4.8 <=5.0 Ratio   VLDL 27 <30 mg/dL   LDL Cholesterol 64 <130 mg/dL     Assessment/Plan:  40 y.o. y/o  male here for CPE   ICD-9-CM ICD-10-CM   1. Annual physical exam V70.0 Z00.00 POCT CBC     POCT glycosylated hemoglobin (Hb A1C)     POCT urinalysis dipstick     BASIC METABOLIC PANEL WITH GFR     Lipid panel     Microalbumin, urine  2. Essential hypertension, benign 401.1 I10   3. Uncontrolled type 2 diabetes mellitus with complication, without long-term current use of insulin (HCC) 250.82 E11.8     E11.65   4. Obesity (BMI 30-39.9) 278.00 E66.9   5. Obstructive sleep apnea 327.23 G47.33   6. Mixed hyperlipidemia 272.2 E78.2     Signed, Robyn Haber, MD 05/02/2016 11:30 AM

## 2016-05-12 ENCOUNTER — Other Ambulatory Visit: Payer: Self-pay | Admitting: Physician Assistant

## 2016-05-16 NOTE — Telephone Encounter (Signed)
04/2016 last exam and labs

## 2016-06-27 ENCOUNTER — Ambulatory Visit (INDEPENDENT_AMBULATORY_CARE_PROVIDER_SITE_OTHER): Payer: Managed Care, Other (non HMO) | Admitting: Physician Assistant

## 2016-06-27 ENCOUNTER — Encounter: Payer: Self-pay | Admitting: Physician Assistant

## 2016-06-27 VITALS — BP 116/70 | HR 100 | Temp 97.8°F | Resp 18 | Ht 67.5 in | Wt 206.2 lb

## 2016-06-27 DIAGNOSIS — I10 Essential (primary) hypertension: Secondary | ICD-10-CM

## 2016-06-27 DIAGNOSIS — E1165 Type 2 diabetes mellitus with hyperglycemia: Secondary | ICD-10-CM | POA: Diagnosis not present

## 2016-06-27 DIAGNOSIS — IMO0002 Reserved for concepts with insufficient information to code with codable children: Secondary | ICD-10-CM

## 2016-06-27 DIAGNOSIS — E782 Mixed hyperlipidemia: Secondary | ICD-10-CM | POA: Diagnosis not present

## 2016-06-27 DIAGNOSIS — E118 Type 2 diabetes mellitus with unspecified complications: Secondary | ICD-10-CM

## 2016-06-27 DIAGNOSIS — Z6831 Body mass index (BMI) 31.0-31.9, adult: Secondary | ICD-10-CM

## 2016-06-27 LAB — COMPREHENSIVE METABOLIC PANEL
ALT: 38 U/L (ref 9–46)
AST: 21 U/L (ref 10–40)
Albumin: 4.3 g/dL (ref 3.6–5.1)
Alkaline Phosphatase: 42 U/L (ref 40–115)
BUN: 22 mg/dL (ref 7–25)
CHLORIDE: 104 mmol/L (ref 98–110)
CO2: 26 mmol/L (ref 20–31)
Calcium: 9.2 mg/dL (ref 8.6–10.3)
Creat: 1.22 mg/dL (ref 0.60–1.35)
Glucose, Bld: 211 mg/dL — ABNORMAL HIGH (ref 65–99)
POTASSIUM: 4 mmol/L (ref 3.5–5.3)
Sodium: 138 mmol/L (ref 135–146)
TOTAL PROTEIN: 6.3 g/dL (ref 6.1–8.1)
Total Bilirubin: 0.5 mg/dL (ref 0.2–1.2)

## 2016-06-27 LAB — HEMOGLOBIN A1C
HEMOGLOBIN A1C: 7.1 % — AB (ref <5.7)
Mean Plasma Glucose: 157 mg/dL

## 2016-06-27 NOTE — Progress Notes (Signed)
Patient ID: Brad Pineda., male    DOB: 1975-11-08, 40 y.o.   MRN: [id number]  PCP: Harrison Mons, PA-C  Chief Complaint  Patient presents with  . Follow-up    BP, diabetes, and cholestrol    Subjective:   Presents for evaluation of diabetes, HTN and lipids.  He had a wellness visit 04/2016 with Dr. Joseph Art. Feels good. Doesn't check his glucose at home.  Coughs a lot. Unsure if it's related to meals. Non-productive. Started before he started the ARB for HTN. Takes daily lansoprazole. 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    Review of Systems  Constitutional: Negative for activity change, appetite change, fatigue and unexpected weight change.  HENT: Negative for congestion, dental problem, ear pain, hearing loss, mouth sores, postnasal drip, rhinorrhea, sneezing, sore throat, tinnitus and trouble swallowing.   Eyes: Negative for photophobia, pain, redness and visual disturbance.  Respiratory: Positive for cough. Negative for chest tightness and shortness of breath.   Cardiovascular: Negative for chest pain, palpitations and leg swelling.  Gastrointestinal: Negative for abdominal pain, blood in stool, constipation, diarrhea, nausea and vomiting.  Endocrine: Negative.   Genitourinary: Negative for dysuria, frequency, hematuria and urgency.  Musculoskeletal: Negative for arthralgias, gait problem, myalgias and neck stiffness.  Skin: Negative for rash.  Allergic/Immunologic: Positive for environmental allergies. Negative for food allergies and immunocompromised state.  Neurological: Negative for dizziness, speech difficulty, weakness, light-headedness, numbness and headaches.  Hematological: Negative for adenopathy.  Psychiatric/Behavioral: Negative for confusion and sleep disturbance. The patient is not nervous/anxious.        Patient Active Problem List   Diagnosis Date Noted  . Glaucoma 08/24/2015  . Nonalcoholic steatohepatitis  (NASH) 08/24/2015  . OSA on CPAP 12/10/2013  . BMI 29.0-29.9,adult 05/13/2013  . Diabetes mellitus type 2, uncontrolled, with complications ([place])   . Essential hypertension, benign   . Mixed hyperlipidemia   . GERD (gastroesophageal reflux disease)   . Anxiety      Prior to Admission medications   Medication Sig Start Date End Date Taking? Authorizing Provider  ammonium lactate (LAC-HYDRIN) 12 % lotion Apply 1 application topically 2 (two) times daily 07/06/15  Yes Zaira Iacovelli, PA-C  aspirin 81 MG tablet Take 162 mg by mouth once. Takes 3 qd   Yes Historical Provider, MD  cetirizine (ZYRTEC) 10 MG tablet Take 10 mg by mouth daily.   Yes Historical Provider, MD  FARXIGA 5 MG TABS tablet TAKE 1 TABLET (5 MG) BY MOUTH DAILY. 05/16/16  Yes Adolph Clutter, PA-C  fenofibrate (TRICOR) 145 MG tablet TAKE 1 TABLET (145 MG TOTAL) BY MOUTH DAILY. 12/07/15  Yes Oluwanifemi Susman, PA-C  fish oil-omega-3 fatty acids 1000 MG capsule Take 1 g by mouth daily.    Yes Historical Provider, MD  glipiZIDE (GLUCOTROL XL) 10 MG 24 hr tablet TAKE 1 TABLET (10 MG TOTAL) BY MOUTH DAILY WITH BREAKFAST. 08/24/15  Yes Talulah Schirmer, PA-C  hydrochlorothiazide (HYDRODIURIL) 12.5 MG tablet Take 1 tablet (12.5 mg total) by mouth daily. 12/07/15  Yes Kayline Sheer, PA-C  ibuprofen (ADVIL,MOTRIN) 200 MG tablet Take 200 mg by mouth every 6 (six) hours as needed.   Yes Historical Provider, MD  ipratropium (ATROVENT) 0.03 % nasal spray Place 2 sprays into both nostrils every 12 (twelve) hours. 04/20/16  Yes Mariano Doshi, PA-C  lansoprazole (PREVACID) 15 MG capsule Take 15 mg by mouth daily.   Yes Historical Provider, MD  losartan (COZAAR) 100 MG tablet Take 1 tablet (100  mg total) by mouth daily. 08/24/15  Yes Donley Harland, PA-C  metFORMIN (GLUCOPHAGE) 500 MG tablet Take 1 tablet (500 mg total) by mouth 3 (three) times daily. 11/30/15  Yes Ambers Iyengar, PA-C  niacin (NIASPAN) 500 MG CR tablet Take 1 tablet (500 mg total) by  mouth daily. 12/07/15  Yes Maui Ahart, PA-C  PARoxetine (PAXIL) 40 MG tablet TAKE 1 TABLET (40 MG TOTAL) BY MOUTH DAILY. 11/18/15  Yes Braileigh Landenberger, PA-C  pravastatin (PRAVACHOL) 40 MG tablet Take 1 tablet (40 mg total) by mouth daily. 08/24/15  Yes Cimone Fahey, PA-C  sitaGLIPtin (JANUVIA) 100 MG tablet Take 1 tablet (100 mg total) by mouth daily. 08/24/15  Yes Osborne Serio, PA-C     No Known Allergies     Objective:  Physical Exam  Constitutional: He is oriented to person, place, and time. He appears well-developed and well-nourished. He is active and cooperative. No distress.  BP 116/70   Pulse 100   Temp 97.8 F (36.6 C) (Oral)   Resp 18   Ht 5' 7.5" (1.715 m)   Wt 206 lb 3.2 oz (93.5 kg)   SpO2 97%   BMI 31.82 kg/m   HENT:  Head: Normocephalic and atraumatic.  Right Ear: Hearing normal.  Left Ear: Hearing normal.  Eyes: Conjunctivae are normal. No scleral icterus.  Neck: Normal range of motion. Neck supple. No thyromegaly present.  Cardiovascular: Normal rate, regular rhythm and normal heart sounds.   Pulses:      Radial pulses are 2+ on the right side, and 2+ on the left side.  Pulmonary/Chest: Effort normal and breath sounds normal.  Lymphadenopathy:       Head (right side): No tonsillar, no preauricular, no posterior auricular and no occipital adenopathy present.       Head (left side): No tonsillar, no preauricular, no posterior auricular and no occipital adenopathy present.    He has no cervical adenopathy.       Right: No supraclavicular adenopathy present.       Left: No supraclavicular adenopathy present.  Neurological: He is alert and oriented to person, place, and time. No sensory deficit.  Skin: Skin is warm, dry and intact. No rash noted. No cyanosis or erythema. Nails show no clubbing.  Psychiatric: He has a normal mood and affect. His speech is normal and behavior is normal.           Assessment & Plan:   1. Uncontrolled type 2 diabetes  mellitus with complication, without long-term current use of insulin ([place]) Await labs. Adjust regimen as indicated by results. - Comprehensive metabolic panel - Hemoglobin A1c  2. Essential hypertension, benign Controlled.  3. Mixed hyperlipidemia Await labs. Adjust regimen as indicated by results.  4. BMI 31.0-31.9,adult Increase exercise. Continue working on healthy eating.  Cough may be allergic/irritant, ARB, GERD. No history of asthma. Increase prevacid to BID for several weeks to see if that helps. If not, would do trial off losartan.  Return in about 3 months (around 09/27/2016) for re-evaluation of diabetes, HTN cholesterol.    Fara Chute, PA-C Physician Assistant-Certified [facility]

## 2016-06-27 NOTE — Patient Instructions (Addendum)
Try to increase your exercise to raise the "good" HDL cholesterol.    IF you received an x-ray today, you will receive an invoice from [facility]. Please contact [facility] at [phone] with questions or concerns regarding your invoice.   IF you received labwork today, you will receive an invoice from [employer]. Please contact [facility] at [phone] with questions or concerns regarding your invoice.   Our billing staff will not be able to assist you with questions regarding bills from these companies.  You will be contacted with the lab results as soon as they are available. The fastest way to get your results is to activate your My Chart account. Instructions are located on the last page of this paperwork. If you have not heard from [place] regarding the results in 2 weeks, please contact this office.

## 2016-06-29 ENCOUNTER — Encounter: Payer: Self-pay | Admitting: Physician Assistant

## 2016-07-21 ENCOUNTER — Other Ambulatory Visit: Payer: Self-pay | Admitting: Physician Assistant

## 2016-07-21 DIAGNOSIS — IMO0002 Reserved for concepts with insufficient information to code with codable children: Secondary | ICD-10-CM

## 2016-07-21 DIAGNOSIS — E1165 Type 2 diabetes mellitus with hyperglycemia: Secondary | ICD-10-CM

## 2016-07-21 DIAGNOSIS — E118 Type 2 diabetes mellitus with unspecified complications: Principal | ICD-10-CM

## 2016-08-17 ENCOUNTER — Other Ambulatory Visit: Payer: Self-pay | Admitting: Physician Assistant

## 2016-08-17 DIAGNOSIS — E1165 Type 2 diabetes mellitus with hyperglycemia: Secondary | ICD-10-CM

## 2016-08-17 DIAGNOSIS — I10 Essential (primary) hypertension: Secondary | ICD-10-CM

## 2016-08-17 DIAGNOSIS — IMO0002 Reserved for concepts with insufficient information to code with codable children: Secondary | ICD-10-CM

## 2016-08-17 DIAGNOSIS — E118 Type 2 diabetes mellitus with unspecified complications: Principal | ICD-10-CM

## 2016-08-17 DIAGNOSIS — E782 Mixed hyperlipidemia: Secondary | ICD-10-CM

## 2016-08-23 ENCOUNTER — Ambulatory Visit: Payer: Managed Care, Other (non HMO)

## 2016-08-28 ENCOUNTER — Encounter: Payer: Self-pay | Admitting: Physician Assistant

## 2016-08-28 ENCOUNTER — Ambulatory Visit (INDEPENDENT_AMBULATORY_CARE_PROVIDER_SITE_OTHER): Payer: Commercial Managed Care - PPO | Admitting: Physician Assistant

## 2016-08-28 VITALS — BP 132/80 | HR 123 | Temp 98.8°F | Resp 16 | Ht 68.0 in | Wt 206.0 lb

## 2016-08-28 DIAGNOSIS — E118 Type 2 diabetes mellitus with unspecified complications: Secondary | ICD-10-CM

## 2016-08-28 DIAGNOSIS — E1165 Type 2 diabetes mellitus with hyperglycemia: Secondary | ICD-10-CM

## 2016-08-28 DIAGNOSIS — J0191 Acute recurrent sinusitis, unspecified: Secondary | ICD-10-CM | POA: Diagnosis not present

## 2016-08-28 DIAGNOSIS — IMO0002 Reserved for concepts with insufficient information to code with codable children: Secondary | ICD-10-CM

## 2016-08-28 MED ORDER — FLUTICASONE PROPIONATE 50 MCG/ACT NA SUSP: 2.0000 | Freq: Every day | NASAL | 12 refills | Status: DC

## 2016-08-28 MED ORDER — AZITHROMYCIN 250 MG PO TABS: ORAL_TABLET | ORAL | 0 refills | Status: DC

## 2016-08-28 NOTE — Progress Notes (Signed)
Patient ID: Brad Pineda., male    DOB: 07-18-1976, 41 y.o.   MRN: [id number]  PCP: Harrison Mons, PA-C  Chief Complaint  Patient presents with  . Mass    pt thinks he has a hernia, x 1 month   . Sinus Problem    x 3 weeks    Subjective:   Presents for evaluation of 2 issues.  While visiting his parents in in [place] in early January, he was seen for a respiratory illness and diagnosed with sinusitis. He was prescribed Augmentin, benzonatate, loratadine and Mucinex. He improved, and completed the treatment, but did not get completely well. Then about 7 days ago, the symptoms started worsening again. He was scheduled to see me on Wednesday 08/23/2016, but the office was closed for the snow. On Saturday, 08/26/2016 he started OTC multi-symptom cold relief (acetaminophen, dextromethorphan, guaifenesin, phenylephrine and resumed loratadine.  He is scheduled for jury duty tomorrow.  No fever, chills. HA and dizziness. Coughing, sometimes productive, but mostly not. Brownish sputum. Nasal drainage is light brown or clear. No ear pain or fullness. Sore throat. Mild nausea. No vomiting or diarrhea. Generalized body aches. Lots of pressure around the eyes and nose. Sometimes thinks he's wheezing, feels like he needs to take a deep breath.  Noticed a bulge, or "skin flap" in early December in the RIGHT low abdomen. No pain.  Resolved spontaneously about 10 days ago and has not recurred.   Review of Systems  Constitutional: Positive for activity change, appetite change and fatigue. Negative for chills, diaphoresis, fever and unexpected weight change.  HENT: Positive for congestion, postnasal drip, rhinorrhea, sinus pain, sinus pressure and sore throat. Negative for dental problem, drooling, ear discharge, ear pain, facial swelling, hearing loss, mouth sores, nosebleeds, sneezing, tinnitus, trouble swallowing and voice change.   Eyes: Negative for pain, redness and visual disturbance.    Respiratory: Positive for cough. Negative for apnea, choking, chest tightness, shortness of breath, wheezing and stridor.   Cardiovascular: Negative for chest pain, palpitations and leg swelling.  Gastrointestinal: Positive for nausea. Negative for diarrhea and vomiting.  Endocrine: Negative.   Genitourinary: Negative for frequency, hematuria, scrotal swelling and urgency.  Musculoskeletal: Positive for myalgias. Negative for arthralgias, joint swelling and neck pain.  Skin: Negative for rash.  Neurological: Positive for dizziness and headaches. Negative for syncope, speech difficulty, weakness and light-headedness.  Hematological: Negative for adenopathy. Does not bruise/bleed easily.       Patient Active Problem List   Diagnosis Date Noted  . Glaucoma 08/24/2015  . Nonalcoholic steatohepatitis (NASH) 08/24/2015  . OSA on CPAP 12/10/2013  . BMI 31.0-31.9,adult 05/13/2013  . Diabetes mellitus type 2, uncontrolled, with complications ([place])   . Essential hypertension, benign   . Mixed hyperlipidemia   . GERD (gastroesophageal reflux disease)   . Anxiety      Prior to Admission medications   Medication Sig Start Date End Date Taking? Authorizing Provider  ammonium lactate (LAC-HYDRIN) 12 % lotion Apply 1 application topically 2 (two) times daily 07/06/15  Yes Ayuub Penley, PA-C  aspirin 81 MG tablet Take 162 mg by mouth once. Takes 3 qd   Yes Historical Provider, MD  cetirizine (ZYRTEC) 10 MG tablet Take 10 mg by mouth daily.   Yes Historical Provider, MD  FARXIGA 5 MG TABS tablet TAKE 1 TABLET (5 MG) BY MOUTH DAILY. 05/16/16  Yes Daya Dutt, PA-C  fenofibrate (TRICOR) 145 MG tablet TAKE 1 TABLET (145 MG TOTAL) BY MOUTH DAILY.  12/07/15  Yes Jaivion Kingsley, PA-C  fish oil-omega-3 fatty acids 1000 MG capsule Take 1 g by mouth daily.    Yes Historical Provider, MD  glipiZIDE (GLUCOTROL XL) 10 MG 24 hr tablet TAKE 1 TABLET (10 MG TOTAL) BY MOUTH DAILY WITH BREAKFAST. 08/24/15  Yes  Antonique Langford, PA-C  hydrochlorothiazide (HYDRODIURIL) 12.5 MG tablet Take 1 tablet (12.5 mg total) by mouth daily. 12/07/15  Yes Gavynn Duvall, PA-C  ibuprofen (ADVIL,MOTRIN) 200 MG tablet Take 200 mg by mouth every 6 (six) hours as needed.   Yes Historical Provider, MD  ipratropium (ATROVENT) 0.03 % nasal spray Place 2 sprays into both nostrils every 12 (twelve) hours. 04/20/16  Yes Avarie Tavano, PA-C  JANUVIA 100 MG tablet TAKE 1 TABLET (100 MG TOTAL) BY MOUTH DAILY. 08/19/16  Yes Momoko Slezak, PA-C  lansoprazole (PREVACID) 15 MG capsule Take 15 mg by mouth daily.   Yes Historical Provider, MD  losartan (COZAAR) 100 MG tablet TAKE 1 TABLET (100 MG TOTAL) BY MOUTH DAILY. 08/19/16  Yes Oshae Simmering, PA-C  metFORMIN (GLUCOPHAGE) 500 MG tablet TAKE 1 TABLET (500 MG TOTAL) BY MOUTH 3 (THREE) TIMES DAILY. 07/24/16  Yes Vrinda Heckstall, PA-C  niacin (NIASPAN) 500 MG CR tablet Take 1 tablet (500 mg total) by mouth daily. 12/07/15  Yes Teairra Millar, PA-C  PARoxetine (PAXIL) 40 MG tablet TAKE 1 TABLET (40 MG TOTAL) BY MOUTH DAILY. 11/18/15  Yes Abdulwahab Demelo, PA-C  pravastatin (PRAVACHOL) 40 MG tablet TAKE 1 TABLET (40 MG TOTAL) BY MOUTH DAILY. 08/19/16  Yes Alexandre Lightsey, PA-C     No Known Allergies     Objective:  Physical Exam  Constitutional: He is oriented to person, place, and time. He appears well-developed and well-nourished. No distress.  BP 132/80 (BP Location: Right Arm, Patient Position: Sitting, Cuff Size: Large)   Pulse (!) 123   Temp 98.8 F (37.1 C) (Oral)   Resp 16   Ht 5' 8"  (1.727 m)   Wt 206 lb (93.4 kg)   SpO2 97%   BMI 31.32 kg/m    HENT:  Head: Normocephalic and atraumatic.  Right Ear: Hearing, tympanic membrane, external ear and ear canal normal.  Left Ear: Hearing, tympanic membrane, external ear and ear canal normal.  Nose: Mucosal edema and rhinorrhea present.  No foreign bodies. Right sinus exhibits maxillary sinus tenderness and frontal sinus tenderness.  Left sinus exhibits maxillary sinus tenderness and frontal sinus tenderness.  Mouth/Throat: Uvula is midline, oropharynx is clear and moist and mucous membranes are normal. No uvula swelling. No oropharyngeal exudate.  Eyes: Conjunctivae, EOM and lids are normal. Pupils are equal, round, and reactive to light. Right eye exhibits no discharge. Left eye exhibits no discharge. No scleral icterus.  Neck: Trachea normal, normal range of motion, full passive range of motion without pain and phonation normal. Neck supple. No thyroid mass and no thyromegaly present.  Cardiovascular: Normal rate, regular rhythm and normal heart sounds.   Pulses:      Radial pulses are 2+ on the right side, and 2+ on the left side.  Pulmonary/Chest: Effort normal and breath sounds normal.  Abdominal: Normal appearance and bowel sounds are normal. There is no tenderness. There is no rigidity, no rebound, no guarding, no tenderness at McBurney's point and negative Murphy's sign. No hernia.  Lymphadenopathy:       Head (right side): No submandibular, no tonsillar, no preauricular, no posterior auricular and no occipital adenopathy present.       Head (left  side): No submandibular, no tonsillar, no preauricular and no occipital adenopathy present.    He has no cervical adenopathy.       Right: No supraclavicular adenopathy present.       Left: No supraclavicular adenopathy present.  Neurological: He is alert and oriented to person, place, and time. He has normal strength. No cranial nerve deficit or sensory deficit.  Skin: Skin is warm, dry and intact. No rash noted.  Psychiatric: He has a normal mood and affect. His speech is normal and behavior is normal.           Assessment & Plan:   1. Acute recurrent sinusitis, unspecified location Avoid prednisone for now, due to diabetes. Try different antibiotic, though may not be infectious at this point. Continue Mucinex, cetirizine, Atrovent NS. Add Flonase. Continue rest  and hydration. - azithromycin (ZITHROMAX) 250 MG tablet; Take 2 tabs PO x 1 dose, then 1 tab PO QD x 4 days  Dispense: 6 tablet; Refill: 0 - fluticasone (FLONASE) 50 MCG/ACT nasal spray; Place 2 sprays into both nostrils daily.  Dispense: 16 g; Refill: 12  2. Uncontrolled type 2 diabetes mellitus with complication, without long-term current use of insulin ([place]) Follow-up next month as planned for routine re-evaluation.  Possible hernia, RIGHT inguinal. If recurs, he'll let me know.   Fara Chute, PA-C Physician Assistant-Certified [facility]

## 2016-08-28 NOTE — Patient Instructions (Addendum)
Go back to taking the Zyrtec (cetirizine) instead of the loratadine. Go back the the Mucinex (Guaifenisen) 1200 mg twice each day. Drink at least 64 ounces of water. ADD the Flonase. When you are well, stop the Atrovent. If your allergies seem to worsen, restart the Atrovent. You can use the Atrovent AS NEEDED, but use the Flonase every day.  If the bulge recurs, let me know. If I can examine it, that is even better. As long as it's not causing you pain, and not getting larger, we can just check it at your visit next month.    IF you received an x-ray today, you will receive an invoice from [facility]. Please contact [facility] at [phone] with questions or concerns regarding your invoice.   IF you received labwork today, you will receive an invoice from [place]. Please contact [facility] at [phone] with questions or concerns regarding your invoice.   Our billing staff will not be able to assist you with questions regarding bills from these companies.  You will be contacted with the lab results as soon as they are available. The fastest way to get your results is to activate your My Chart account. Instructions are located on the last page of this paperwork. If you have not heard from [place] regarding the results in 2 weeks, please contact this office.

## 2016-10-03 ENCOUNTER — Ambulatory Visit (INDEPENDENT_AMBULATORY_CARE_PROVIDER_SITE_OTHER): Payer: Commercial Managed Care - PPO | Admitting: Physician Assistant

## 2016-10-03 ENCOUNTER — Encounter: Payer: Self-pay | Admitting: Physician Assistant

## 2016-10-03 VITALS — BP 130/80 | HR 94 | Temp 98.0°F | Resp 18 | Ht 68.0 in | Wt 205.0 lb

## 2016-10-03 DIAGNOSIS — K7581 Nonalcoholic steatohepatitis (NASH): Secondary | ICD-10-CM | POA: Diagnosis not present

## 2016-10-03 DIAGNOSIS — E1165 Type 2 diabetes mellitus with hyperglycemia: Secondary | ICD-10-CM

## 2016-10-03 DIAGNOSIS — Z6831 Body mass index (BMI) 31.0-31.9, adult: Secondary | ICD-10-CM | POA: Diagnosis not present

## 2016-10-03 DIAGNOSIS — I10 Essential (primary) hypertension: Secondary | ICD-10-CM

## 2016-10-03 DIAGNOSIS — E118 Type 2 diabetes mellitus with unspecified complications: Secondary | ICD-10-CM

## 2016-10-03 DIAGNOSIS — E782 Mixed hyperlipidemia: Secondary | ICD-10-CM | POA: Diagnosis not present

## 2016-10-03 DIAGNOSIS — F419 Anxiety disorder, unspecified: Secondary | ICD-10-CM | POA: Diagnosis not present

## 2016-10-03 DIAGNOSIS — IMO0002 Reserved for concepts with insufficient information to code with codable children: Secondary | ICD-10-CM

## 2016-10-03 NOTE — Progress Notes (Signed)
Patient ID: Brad Pineda., male    DOB: 12/09/1975, 41 y.o.   MRN: [id number]  PCP: Harrison Mons, PA-C  Chief Complaint  Patient presents with  . Follow-up    3 MONTH ON DIABETES    Subjective:   Presents for follow up of his Diabetes and Hyperlipidemia. Last seen for his DM and hyperlipidmia in November 2017, however was seen 08/28/2016 for sinusitis. He continues to not check his sugars at home and activity level has dropped since it became colder outside and from a injury he sustained walking his dog 12/27/2015. He has no issues with his current medications and hasn't noticed any adverse effects.   Review of Systems  All other systems reviewed and are negative.   Patient Active Problem List   Diagnosis Date Noted  . Glaucoma 08/24/2015  . Nonalcoholic steatohepatitis (NASH) 08/24/2015  . OSA on CPAP 12/10/2013  . BMI 31.0-31.9,adult 05/13/2013  . Diabetes mellitus type 2, uncontrolled, with complications ([place])   . Essential hypertension, benign   . Mixed hyperlipidemia   . GERD (gastroesophageal reflux disease)   . Anxiety      Prior to Admission medications   Medication Sig Start Date End Date Taking? Authorizing Provider  ammonium lactate (LAC-HYDRIN) 12 % lotion Apply 1 application topically 2 (two) times daily 07/06/15  Yes Chelle Jeffery, PA-C  aspirin 81 MG tablet Take 162 mg by mouth once. Takes 3 qd   Yes Historical Provider, MD  cetirizine (ZYRTEC) 10 MG tablet Take 10 mg by mouth daily.   Yes Historical Provider, MD  FARXIGA 5 MG TABS tablet TAKE 1 TABLET (5 MG) BY MOUTH DAILY. 05/16/16  Yes Chelle Jeffery, PA-C  fenofibrate (TRICOR) 145 MG tablet TAKE 1 TABLET (145 MG TOTAL) BY MOUTH DAILY. 12/07/15  Yes Chelle Jeffery, PA-C  fish oil-omega-3 fatty acids 1000 MG capsule Take 1 g by mouth daily.    Yes Historical Provider, MD  fluticasone (FLONASE) 50 MCG/ACT nasal spray Place 2 sprays into both nostrils daily. 08/28/16  Yes Chelle Jeffery, PA-C    glipiZIDE (GLUCOTROL XL) 10 MG 24 hr tablet TAKE 1 TABLET (10 MG TOTAL) BY MOUTH DAILY WITH BREAKFAST. 08/24/15  Yes Chelle Jeffery, PA-C  hydrochlorothiazide (HYDRODIURIL) 12.5 MG tablet Take 1 tablet (12.5 mg total) by mouth daily. 12/07/15  Yes Chelle Jeffery, PA-C  ibuprofen (ADVIL,MOTRIN) 200 MG tablet Take 200 mg by mouth every 6 (six) hours as needed.   Yes Historical Provider, MD  ipratropium (ATROVENT) 0.03 % nasal spray Place 2 sprays into both nostrils every 12 (twelve) hours. 04/20/16  Yes Chelle Jeffery, PA-C  JANUVIA 100 MG tablet TAKE 1 TABLET (100 MG TOTAL) BY MOUTH DAILY. 08/19/16  Yes Chelle Jeffery, PA-C  lansoprazole (PREVACID) 15 MG capsule Take 15 mg by mouth daily.   Yes Historical Provider, MD  losartan (COZAAR) 100 MG tablet TAKE 1 TABLET (100 MG TOTAL) BY MOUTH DAILY. 08/19/16  Yes Chelle Jeffery, PA-C  metFORMIN (GLUCOPHAGE) 500 MG tablet TAKE 1 TABLET (500 MG TOTAL) BY MOUTH 3 (THREE) TIMES DAILY. 07/24/16  Yes Chelle Jeffery, PA-C  niacin (NIASPAN) 500 MG CR tablet Take 1 tablet (500 mg total) by mouth daily. 12/07/15  Yes Chelle Jeffery, PA-C  PARoxetine (PAXIL) 40 MG tablet TAKE 1 TABLET (40 MG TOTAL) BY MOUTH DAILY. 11/18/15  Yes Chelle Jeffery, PA-C  pravastatin (PRAVACHOL) 40 MG tablet TAKE 1 TABLET (40 MG TOTAL) BY MOUTH DAILY. 08/19/16  Yes Harrison Mons, PA-C  No Known Allergies     Objective:  Physical Exam  Constitutional: He is oriented to person, place, and time. He appears well-developed and well-nourished. He is active.  Blood pressure 130/80, pulse 94, temperature 98 F (36.7 C), temperature source Oral, resp. rate 18, height 5' 8"  (1.727 m), weight 205 lb (93 kg), SpO2 97 %.  Eyes: Conjunctivae and EOM are normal. Pupils are equal, round, and reactive to light.  Fundoscopic exam:      The right eye shows no arteriolar narrowing, no AV nicking, no exudate, no hemorrhage and no papilledema. The right eye shows red reflex.       The left eye shows no  arteriolar narrowing, no AV nicking, no exudate, no hemorrhage and no papilledema. The left eye shows red reflex.  Neck: Normal range of motion.  Cardiovascular: Normal rate, regular rhythm and normal heart sounds.   Pulmonary/Chest: Effort normal and breath sounds normal.  Neurological: He is alert and oriented to person, place, and time.  Skin: Skin is warm and dry.  Psychiatric: He has a normal mood and affect. His behavior is normal. Judgment and thought content normal.  Vitals reviewed.  Diabetic Foot Exam - Simple   Simple Foot Form Diabetic Foot exam was performed with the following findings:  Yes 10/03/2016  1:08 PM  Visual Inspection No deformities, no ulcerations, no other skin breakdown bilaterally:  Yes See comments:  Yes Sensation Testing Intact to touch and monofilament testing bilaterally:  Yes Pulse Check Posterior Tibialis and Dorsalis pulse intact bilaterally:  Yes Comments Minor abrasions/ulcerations of the posterior aspect of the ankle/achillies where his new shoes he bought two weeks ago have rubbed him. He has since stopped wearing the shoes. Pt states they have been healing well.     Assessment & Plan:  1. Uncontrolled type 2 diabetes mellitus with complication, without long-term current use of insulin ([place]) Discussed making an appointment with an eye doctor and dentist  - Hemoglobin A1c - [facility] DIABETES EYE EXAM - [facility] DIABETES FOOT EXAM  2. Essential hypertension, benign - CBC with Differential/Platelet - Comprehensive metabolic panel - Hemoglobin A1c  3. Mixed hyperlipidemia - Lipid panel  4. Nonalcoholic steatohepatitis (NASH) - Comprehensive metabolic panel  5. Anxiety Continue anxiety medication and mindfulness to prevent anxiety attacks   6. BMI 31.0-31.9,adult Continued diet and lifestyle modification was reinforced

## 2016-10-03 NOTE — Progress Notes (Signed)
Patient ID: Brad Pineda., male    DOB: 10/07/1975, 41 y.o.   MRN: [id number]  PCP: Harrison Mons, PA-C  Chief Complaint  Patient presents with  . Follow-up    3 MONTH ON DIABETES    Subjective:   Presents for evaluation of diabetes, HTN and hyperlipidemia.  Reports that he's doing ok, no new problems or concerns. Tolerating his meds without adverse effects. Still hasn't really gotten back into exercising since ankle fracture 12/2015. Colder weather has been an obstacle. He does not check his home glucose.    Review of Systems Denies chest pain, shortness of breath, HA, dizziness, vision change, nausea, vomiting, diarrhea, constipation, melena, hematochezia, dysuria, increased urinary urgency or frequency, increased hunger or thirst, unintentional weight change, unexplained myalgias or arthralgias, rash.    Patient Active Problem List   Diagnosis Date Noted  . Glaucoma 08/24/2015  . Nonalcoholic steatohepatitis (NASH) 08/24/2015  . OSA on CPAP 12/10/2013  . BMI 31.0-31.9,adult 05/13/2013  . Diabetes mellitus type 2, uncontrolled, with complications ([place])   . Essential hypertension, benign   . Mixed hyperlipidemia   . GERD (gastroesophageal reflux disease)   . Anxiety      Prior to Admission medications   Medication Sig Start Date End Date Taking? Authorizing Provider  ammonium lactate (LAC-HYDRIN) 12 % lotion Apply 1 application topically 2 (two) times daily 07/06/15  Yes Marilin Kofman, PA-C  aspirin 81 MG tablet Take 162 mg by mouth once. Takes 3 qd   Yes Historical Provider, MD  cetirizine (ZYRTEC) 10 MG tablet Take 10 mg by mouth daily.   Yes Historical Provider, MD  FARXIGA 5 MG TABS tablet TAKE 1 TABLET (5 MG) BY MOUTH DAILY. 05/16/16  Yes Bernita Beckstrom, PA-C  fenofibrate (TRICOR) 145 MG tablet TAKE 1 TABLET (145 MG TOTAL) BY MOUTH DAILY. 12/07/15  Yes Hydie Langan, PA-C  fish oil-omega-3 fatty acids 1000 MG capsule Take 1 g by mouth daily.    Yes  Historical Provider, MD  fluticasone (FLONASE) 50 MCG/ACT nasal spray Place 2 sprays into both nostrils daily. 08/28/16  Yes Leiloni Smithers, PA-C  glipiZIDE (GLUCOTROL XL) 10 MG 24 hr tablet TAKE 1 TABLET (10 MG TOTAL) BY MOUTH DAILY WITH BREAKFAST. 08/24/15  Yes Roshawna Colclasure, PA-C  hydrochlorothiazide (HYDRODIURIL) 12.5 MG tablet Take 1 tablet (12.5 mg total) by mouth daily. 12/07/15  Yes Jowanda Heeg, PA-C  ibuprofen (ADVIL,MOTRIN) 200 MG tablet Take 200 mg by mouth every 6 (six) hours as needed.   Yes Historical Provider, MD  ipratropium (ATROVENT) 0.03 % nasal spray Place 2 sprays into both nostrils every 12 (twelve) hours. 04/20/16  Yes Millard Bautch, PA-C  JANUVIA 100 MG tablet TAKE 1 TABLET (100 MG TOTAL) BY MOUTH DAILY. 08/19/16  Yes Careli Luzader, PA-C  lansoprazole (PREVACID) 15 MG capsule Take 15 mg by mouth daily.   Yes Historical Provider, MD  losartan (COZAAR) 100 MG tablet TAKE 1 TABLET (100 MG TOTAL) BY MOUTH DAILY. 08/19/16  Yes Reina Wilton, PA-C  metFORMIN (GLUCOPHAGE) 500 MG tablet TAKE 1 TABLET (500 MG TOTAL) BY MOUTH 3 (THREE) TIMES DAILY. 07/24/16  Yes Sameria Morss, PA-C  niacin (NIASPAN) 500 MG CR tablet Take 1 tablet (500 mg total) by mouth daily. 12/07/15  Yes Arvella Massingale, PA-C  PARoxetine (PAXIL) 40 MG tablet TAKE 1 TABLET (40 MG TOTAL) BY MOUTH DAILY. 11/18/15  Yes Angelice Piech, PA-C  pravastatin (PRAVACHOL) 40 MG tablet TAKE 1 TABLET (40 MG TOTAL) BY MOUTH DAILY. 08/19/16  Yes Ericah Scotto, PA-C     No Known Allergies     Objective:  Physical Exam  Constitutional: He is oriented to person, place, and time. He appears well-developed and well-nourished. He is active and cooperative. No distress.  BP 130/80 (BP Location: Right Arm, Patient Position: Sitting, Cuff Size: Large)   Pulse 94   Temp 98 F (36.7 C) (Oral)   Resp 18   Ht 5' 8"  (1.727 m)   Wt 205 lb (93 kg)   SpO2 97%   BMI 31.17 kg/m   HENT:  Head: Normocephalic and atraumatic.  Right Ear:  Hearing normal.  Left Ear: Hearing normal.  Eyes: Conjunctivae are normal. No scleral icterus.  Neck: Normal range of motion. Neck supple. No thyromegaly present.  Cardiovascular: Normal rate, regular rhythm and normal heart sounds.   Pulses:      Radial pulses are 2+ on the right side, and 2+ on the left side.  Pulmonary/Chest: Effort normal and breath sounds normal.  Lymphadenopathy:       Head (right side): No tonsillar, no preauricular, no posterior auricular and no occipital adenopathy present.       Head (left side): No tonsillar, no preauricular, no posterior auricular and no occipital adenopathy present.    He has no cervical adenopathy.       Right: No supraclavicular adenopathy present.       Left: No supraclavicular adenopathy present.  Neurological: He is alert and oriented to person, place, and time. No sensory deficit.  Skin: Skin is warm, dry and intact. No rash noted. No cyanosis or erythema. Nails show no clubbing.  Psychiatric: He has a normal mood and affect. His speech is normal and behavior is normal.    Diabetic Foot Exam - Simple   Simple Foot Form Diabetic Foot exam was performed with the following findings:  Yes 10/03/2016  1:08 PM  Visual Inspection No deformities, no ulcerations, no other skin breakdown bilaterally:  Yes See comments:  Yes Sensation Testing Intact to touch and monofilament testing bilaterally:  Yes Pulse Check Posterior Tibialis and Dorsalis pulse intact bilaterally:  Yes Comments Minor abrasions/ulcerations of the posterior aspect of the ankle/achillies where his new shoes he bought two weeks ago have rubbed him. He has since stopped wearing the shoes. Pt states they have been healing well.         Assessment & Plan:   1. Uncontrolled type 2 diabetes mellitus with complication, without long-term current use of insulin ([place]) Await labs. Adjust regimen as indicated by results. - Hemoglobin A1c - HM DIABETES EYE EXAM - HM DIABETES  FOOT EXAM  2. Essential hypertension, benign Controlled. - CBC with Differential/Platelet - Comprehensive metabolic panel - Hemoglobin A1c  3. Mixed hyperlipidemia Await labs. Adjust regimen as indicated by results. - Lipid panel  4. Nonalcoholic steatohepatitis (NASH) Await labs. Adjust regimen as indicated by results. - Comprehensive metabolic panel  5. Anxiety Stable. Controlled.  6. BMI 31.0-31.9,adult Encouraged him to resume exercise, initial goal of 150 minutes/week.   Fara Chute, PA-C Physician Assistant-Certified [facility] at [place]

## 2016-10-03 NOTE — Patient Instructions (Addendum)
Especially as the weather is improving, take advantage of sunny days and get walking again!  Please schedule an eye exam. If you don't have a specialist, these are some that I especially like: [facility]  [address], [place], [place] [address]  Phone: [phone]  [facility] [place], [place], [place] [address]  Phone: [phone]  [facility]. [address] [place],  [address]  Phone: [phone]      IF you received an x-ray today, you will receive an invoice from [facility]. Please contact [facility] at [phone] with questions or concerns regarding your invoice.   IF you received labwork today, you will receive an invoice from [facility]. Please contact [facility] at [phone] with questions or concerns regarding your invoice.   Our billing staff will not be able to assist you with questions regarding bills from these companies.  You will be contacted with the lab results as soon as they are available. The fastest way to get your results is to activate your My Chart account. Instructions are located on the last page of this paperwork. If you have not heard from [place] regarding the results in 2 weeks, please contact this office.

## 2016-10-04 LAB — CBC WITH DIFFERENTIAL/PLATELET
BASOS ABS: 0 10*3/uL (ref 0.0–0.2)
Basos: 0 %
EOS (ABSOLUTE): 0.1 10*3/uL (ref 0.0–0.4)
EOS: 1 %
HEMATOCRIT: 45.5 % (ref 37.5–51.0)
HEMOGLOBIN: 15.8 g/dL (ref 13.0–17.7)
IMMATURE GRANS (ABS): 0 10*3/uL (ref 0.0–0.1)
IMMATURE GRANULOCYTES: 0 %
LYMPHS: 36 %
Lymphocytes Absolute: 2.5 10*3/uL (ref 0.7–3.1)
MCH: 28.9 pg (ref 26.6–33.0)
MCHC: 34.7 g/dL (ref 31.5–35.7)
MCV: 83 fL (ref 79–97)
MONOCYTES: 7 %
Monocytes Absolute: 0.5 10*3/uL (ref 0.1–0.9)
Neutrophils Absolute: 3.8 10*3/uL (ref 1.4–7.0)
Neutrophils: 56 %
Platelets: 261 10*3/uL (ref 150–379)
RBC: 5.47 x10E6/uL (ref 4.14–5.80)
RDW: 14.3 % (ref 12.3–15.4)
WBC: 6.9 10*3/uL (ref 3.4–10.8)

## 2016-10-04 LAB — COMPREHENSIVE METABOLIC PANEL
ALT: 43 IU/L (ref 0–44)
AST: 23 IU/L (ref 0–40)
Albumin/Globulin Ratio: 1.9 (ref 1.2–2.2)
Albumin: 4.4 g/dL (ref 3.5–5.5)
Alkaline Phosphatase: 56 IU/L (ref 39–117)
BUN/Creatinine Ratio: 16 (ref 9–20)
BUN: 17 mg/dL (ref 6–24)
Bilirubin Total: 0.6 mg/dL (ref 0.0–1.2)
CALCIUM: 9.2 mg/dL (ref 8.7–10.2)
CO2: 22 mmol/L (ref 18–29)
Chloride: 98 mmol/L (ref 96–106)
Creatinine, Ser: 1.07 mg/dL (ref 0.76–1.27)
GFR, EST AFRICAN AMERICAN: 98 mL/min/{1.73_m2} (ref >59)
GFR, EST NON AFRICAN AMERICAN: 85 mL/min/{1.73_m2} (ref >59)
Globulin, Total: 2.3 g/dL (ref 1.5–4.5)
Glucose: 161 mg/dL — ABNORMAL HIGH (ref 65–99)
Potassium: 3.9 mmol/L (ref 3.5–5.2)
Sodium: 138 mmol/L (ref 134–144)
TOTAL PROTEIN: 6.7 g/dL (ref 6.0–8.5)

## 2016-10-04 LAB — LIPID PANEL
Chol/HDL Ratio: 5.4 ratio units — ABNORMAL HIGH (ref 0.0–5.0)
Cholesterol, Total: 130 mg/dL (ref 100–199)
HDL: 24 mg/dL — AB (ref >39)
LDL Calculated: 68 mg/dL (ref 0–99)
Triglycerides: 191 mg/dL — ABNORMAL HIGH (ref 0–149)
VLDL CHOLESTEROL CAL: 38 mg/dL (ref 5–40)

## 2016-10-04 LAB — HEMOGLOBIN A1C
Est. average glucose Bld gHb Est-mCnc: 169 mg/dL
HEMOGLOBIN A1C: 7.5 % — AB (ref 4.8–5.6)

## 2016-10-20 ENCOUNTER — Other Ambulatory Visit: Payer: Self-pay | Admitting: Physician Assistant

## 2016-10-31 ENCOUNTER — Other Ambulatory Visit: Payer: Self-pay | Admitting: Physician Assistant

## 2016-10-31 DIAGNOSIS — E1165 Type 2 diabetes mellitus with hyperglycemia: Secondary | ICD-10-CM

## 2016-10-31 DIAGNOSIS — E118 Type 2 diabetes mellitus with unspecified complications: Principal | ICD-10-CM

## 2016-10-31 DIAGNOSIS — IMO0002 Reserved for concepts with insufficient information to code with codable children: Secondary | ICD-10-CM

## 2016-11-08 ENCOUNTER — Other Ambulatory Visit: Payer: Self-pay | Admitting: Physician Assistant

## 2016-11-08 DIAGNOSIS — I10 Essential (primary) hypertension: Secondary | ICD-10-CM

## 2016-11-08 DIAGNOSIS — IMO0002 Reserved for concepts with insufficient information to code with codable children: Secondary | ICD-10-CM

## 2016-11-08 DIAGNOSIS — E118 Type 2 diabetes mellitus with unspecified complications: Secondary | ICD-10-CM

## 2016-11-08 DIAGNOSIS — E782 Mixed hyperlipidemia: Secondary | ICD-10-CM

## 2016-11-08 DIAGNOSIS — E1165 Type 2 diabetes mellitus with hyperglycemia: Secondary | ICD-10-CM

## 2016-11-09 NOTE — Telephone Encounter (Signed)
BP Readings from Last 3 Encounters:  10/03/16 130/80  08/28/16 132/80  06/27/16 116/70

## 2016-11-23 ENCOUNTER — Encounter: Payer: Self-pay | Admitting: Physician Assistant

## 2017-01-02 ENCOUNTER — Ambulatory Visit (INDEPENDENT_AMBULATORY_CARE_PROVIDER_SITE_OTHER): Payer: Commercial Managed Care - PPO | Admitting: Physician Assistant

## 2017-01-02 ENCOUNTER — Encounter: Payer: Self-pay | Admitting: Physician Assistant

## 2017-01-02 VITALS — BP 116/77 | HR 88 | Temp 98.5°F | Resp 16 | Ht 68.0 in | Wt 202.0 lb

## 2017-01-02 DIAGNOSIS — E118 Type 2 diabetes mellitus with unspecified complications: Secondary | ICD-10-CM | POA: Diagnosis not present

## 2017-01-02 DIAGNOSIS — F419 Anxiety disorder, unspecified: Secondary | ICD-10-CM | POA: Diagnosis not present

## 2017-01-02 DIAGNOSIS — E1165 Type 2 diabetes mellitus with hyperglycemia: Secondary | ICD-10-CM | POA: Diagnosis not present

## 2017-01-02 DIAGNOSIS — G4733 Obstructive sleep apnea (adult) (pediatric): Secondary | ICD-10-CM | POA: Diagnosis not present

## 2017-01-02 DIAGNOSIS — E782 Mixed hyperlipidemia: Secondary | ICD-10-CM | POA: Diagnosis not present

## 2017-01-02 DIAGNOSIS — R6889 Other general symptoms and signs: Secondary | ICD-10-CM

## 2017-01-02 DIAGNOSIS — Z9989 Dependence on other enabling machines and devices: Secondary | ICD-10-CM | POA: Diagnosis not present

## 2017-01-02 DIAGNOSIS — I10 Essential (primary) hypertension: Secondary | ICD-10-CM

## 2017-01-02 DIAGNOSIS — IMO0002 Reserved for concepts with insufficient information to code with codable children: Secondary | ICD-10-CM

## 2017-01-02 DIAGNOSIS — K7581 Nonalcoholic steatohepatitis (NASH): Secondary | ICD-10-CM | POA: Diagnosis not present

## 2017-01-02 MED ORDER — METFORMIN HCL 500 MG PO TABS: 500.0000 mg | ORAL_TABLET | Freq: Three times a day (TID) | ORAL | 3 refills | Status: DC

## 2017-01-02 MED ORDER — HYDROCHLOROTHIAZIDE 12.5 MG PO TABS: 12.5000 mg | ORAL_TABLET | Freq: Every day | ORAL | 3 refills | Status: AC

## 2017-01-02 MED ORDER — FENOFIBRATE 145 MG PO TABS: ORAL_TABLET | ORAL | 3 refills | Status: DC

## 2017-01-02 NOTE — Progress Notes (Signed)
Patient ID: Brad Pineda., male    DOB: [birth year], 41 y.o.   MRN: [id number]  PCP: Harrison Mons, PA-C  Chief Complaint  Patient presents with  . Depression    f/u  . Anxiety    "better"    Subjective:   Presents for evaluation of diabetes, HTN and hyperlipidemia.  Overall he feels well. No specific complaints today. Not wearing CPAP, he reveals he hasn't used it since he broke his ankle several years ago. Relates that he can't sleep with it, so he just doesn't use it. Did not follow-up with sleep medicine to inquire about alternative masks, etc. Thinks it would "just be a waste of time."  No CP, SOB, HA, dizziness. Doesn't like work.    Review of Systems  Constitutional: Negative for activity change, appetite change, fatigue and unexpected weight change.  HENT: Negative for congestion, dental problem, ear pain, hearing loss, mouth sores, postnasal drip, rhinorrhea, sneezing, sore throat, tinnitus and trouble swallowing.   Eyes: Negative for photophobia, pain, redness and visual disturbance.  Respiratory: Negative for cough, chest tightness and shortness of breath.   Cardiovascular: Negative for chest pain, palpitations and leg swelling.  Gastrointestinal: Negative for abdominal pain, blood in stool, constipation, diarrhea, nausea and vomiting.  Endocrine: Positive for heat intolerance (gets sweaty at work, but no problems at home). Negative for cold intolerance, polydipsia, polyphagia and polyuria.  Genitourinary: Negative for dysuria, frequency, hematuria and urgency.  Musculoskeletal: Negative for arthralgias, gait problem, myalgias and neck stiffness.  Skin: Negative for rash.  Neurological: Negative for dizziness, speech difficulty, weakness, light-headedness, numbness and headaches.  Hematological: Negative for adenopathy.  Psychiatric/Behavioral: Negative for confusion and sleep disturbance. The patient is not nervous/anxious.     Patient Active Problem  List   Diagnosis Date Noted  . Glaucoma 08/24/2015  . Nonalcoholic steatohepatitis (NASH) 08/24/2015  . OSA on CPAP 12/10/2013  . BMI 31.0-31.9,adult 05/13/2013  . Diabetes mellitus type 2, uncontrolled, with complications ([place])   . Essential hypertension, benign   . Mixed hyperlipidemia   . GERD (gastroesophageal reflux disease)   . Anxiety      Prior to Admission medications   Medication Sig Start Date End Date Taking? Authorizing Provider  ammonium lactate (LAC-HYDRIN) 12 % lotion Apply 1 application topically 2 (two) times daily 07/06/15  Yes Keyante Durio, PA-C  aspirin 81 MG tablet Take 162 mg by mouth once. Takes 3 qd   Yes [provider]  cetirizine (ZYRTEC) 10 MG tablet Take 10 mg by mouth daily.   Yes [provider]  FARXIGA 5 MG TABS tablet TAKE 1 TABLET (5 MG) BY MOUTH DAILY. 11/09/16  Yes Jaynee Eagles, PA-C  fenofibrate (TRICOR) 145 MG tablet TAKE 1 TABLET (145 MG TOTAL) BY MOUTH DAILY. 12/07/15  Yes Ruthanna Macchia, PA-C  fish oil-omega-3 fatty acids 1000 MG capsule Take 1 g by mouth daily.    Yes [provider]  fluticasone (FLONASE) 50 MCG/ACT nasal spray Place 2 sprays into both nostrils daily. 08/28/16  Yes Decklin Weddington, PA-C  glipiZIDE (GLUCOTROL XL) 10 MG 24 hr tablet TAKE 1 TABLET (10 MG TOTAL) BY MOUTH DAILY WITH BREAKFAST. 11/02/16  Yes Weber, Sarah L, PA-C  hydrochlorothiazide (HYDRODIURIL) 12.5 MG tablet TAKE 1 TABLET (12.5 MG TOTAL) BY MOUTH DAILY. 11/09/16  Yes Jaynee Eagles, PA-C  ibuprofen (ADVIL,MOTRIN) 200 MG tablet Take 200 mg by mouth every 6 (six) hours as needed.   Yes [provider]  JANUVIA 100 MG tablet  TAKE 1 TABLET (100 MG TOTAL) BY MOUTH DAILY. 11/09/16  Yes Jaynee Eagles, PA-C  lansoprazole (PREVACID) 15 MG capsule Take 15 mg by mouth daily.   Yes [provider]  losartan (COZAAR) 100 MG tablet TAKE 1 TABLET (100 MG TOTAL) BY MOUTH DAILY. 11/09/16  Yes Jaynee Eagles, PA-C  metFORMIN (GLUCOPHAGE) 500 MG tablet  TAKE 1 TABLET (500 MG TOTAL) BY MOUTH 3 (THREE) TIMES DAILY. 07/24/16  Yes Kipper Buch, PA-C  niacin (NIASPAN) 500 MG CR tablet TAKE 1 TABLET (500 MG TOTAL) BY MOUTH DAILY. 11/09/16  Yes Jaynee Eagles, PA-C  PARoxetine (PAXIL) 40 MG tablet TAKE 1 TABLET (40 MG TOTAL) BY MOUTH DAILY. 10/20/16  Yes Yuleidy Rappleye, PA-C  pravastatin (PRAVACHOL) 40 MG tablet TAKE 1 TABLET (40 MG TOTAL) BY MOUTH DAILY. 11/09/16  Yes Jaynee Eagles, PA-C  ipratropium (ATROVENT) 0.03 % nasal spray Place 2 sprays into both nostrils every 12 (twelve) hours. Patient not taking: Reported on 01/02/2017 04/20/16   Harrison Mons, PA-C     No Known Allergies     Objective:  Physical Exam  Constitutional: He is oriented to person, place, and time. He appears well-developed and well-nourished. He is active and cooperative. No distress.  BP 116/77   Pulse 88   Temp 98.5 F (36.9 C) (Oral)   Resp 16   Ht 5' 8"  (1.727 m)   Wt 202 lb (91.6 kg)   SpO2 95%   BMI 30.71 kg/m   HENT:  Head: Normocephalic and atraumatic.  Right Ear: Hearing normal.  Left Ear: Hearing normal.  Eyes: Conjunctivae are normal. No scleral icterus.  Neck: Normal range of motion. Neck supple. No thyromegaly present.  Cardiovascular: Normal rate, regular rhythm and normal heart sounds.   Pulses:      Radial pulses are 2+ on the right side, and 2+ on the left side.  Pulmonary/Chest: Effort normal and breath sounds normal.  Lymphadenopathy:       Head (right side): No tonsillar, no preauricular, no posterior auricular and no occipital adenopathy present.       Head (left side): No tonsillar, no preauricular, no posterior auricular and no occipital adenopathy present.    He has no cervical adenopathy.       Right: No supraclavicular adenopathy present.       Left: No supraclavicular adenopathy present.  Neurological: He is alert and oriented to person, place, and time. No sensory deficit.  Skin: Skin is warm, dry and intact. No rash noted. No  cyanosis or erythema. Nails show no clubbing.  Psychiatric: He has a normal mood and affect. His speech is normal and behavior is normal.           Assessment & Plan:   Problem List Items Addressed This Visit    Diabetes mellitus type 2, uncontrolled, with complications ([place])    Non-compliant with healthy lifestyle modifications. Await lab results. Adjust regimen if A1C >7%.      Relevant Medications   metFORMIN (GLUCOPHAGE) 500 MG tablet   Other Relevant Orders   Comprehensive metabolic panel   Hemoglobin A1c   Essential hypertension, benign - Primary    Well controlled. Continue current treatment. No changes made today.      Relevant Medications   fenofibrate (TRICOR) 145 MG tablet   hydrochlorothiazide (HYDRODIURIL) 12.5 MG tablet   Other Relevant Orders   CBC with Differential/Platelet   Comprehensive metabolic panel   Mixed hyperlipidemia    Await labs. Adjust regimen as indicated by results.  LDL goal <70.      Relevant Medications   fenofibrate (TRICOR) 145 MG tablet   hydrochlorothiazide (HYDRODIURIL) 12.5 MG tablet   Other Relevant Orders   Comprehensive metabolic panel   Anxiety    Stable. No changes.  If diaphoresis/heat intolerance worsens, consider SSRI as possible cause.      OSA on CPAP    He thinks that seeing sleep medicine would not help him wear the CPAP regularly. We agree that he'll try again, and if he isn't successful, he'll let me know. I'll refer him back to the sleep center to determine if another mask would be more acceptatble to him.      Nonalcoholic steatohepatitis (NASH)    Update labs today. Continue efforts to manage diabetes and lipids.      Relevant Orders   Comprehensive metabolic panel    Other Visit Diagnoses    Heat intolerance       Relevant Orders   TSH   T4, free       Return in about 3 months (around 04/04/2017) for re-evaluation of diabetes, blood pressure, cholesterol.   Fara Chute, PA-C [facility] at [place]

## 2017-01-02 NOTE — Patient Instructions (Addendum)
Please work on wearing your CPAP. If you cannot, then let me know and we will get you back to the sleep specialists and see what the options are.    IF you received an x-ray today, you will receive an invoice from [facility]. Please contact [facility] at [phone] with questions or concerns regarding your invoice.   IF you received labwork today, you will receive an invoice from [place]. Please contact [facility] at [phone] with questions or concerns regarding your invoice.   Our billing staff will not be able to assist you with questions regarding bills from these companies.  You will be contacted with the lab results as soon as they are available. The fastest way to get your results is to activate your My Chart account. Instructions are located on the last page of this paperwork. If you have not heard from [place] regarding the results in 2 weeks, please contact this office.

## 2017-01-02 NOTE — Assessment & Plan Note (Signed)
Non-compliant with healthy lifestyle modifications. Await lab results. Adjust regimen if A1C >7%.

## 2017-01-02 NOTE — Assessment & Plan Note (Signed)
Await labs. Adjust regimen as indicated by results. LDL goal <70.

## 2017-01-02 NOTE — Assessment & Plan Note (Signed)
He thinks that seeing sleep medicine would not help him wear the CPAP regularly. We agree that he'll try again, and if he isn't successful, he'll let me know. I'll refer him back to the sleep center to determine if another mask would be more acceptatble to him.

## 2017-01-02 NOTE — Assessment & Plan Note (Signed)
Stable. No changes.  If diaphoresis/heat intolerance worsens, consider SSRI as possible cause.

## 2017-01-02 NOTE — Assessment & Plan Note (Signed)
Well controlled. Continue current treatment. No changes made today.

## 2017-01-02 NOTE — Assessment & Plan Note (Signed)
Update labs today. Continue efforts to manage diabetes and lipids.

## 2017-01-03 LAB — COMPREHENSIVE METABOLIC PANEL
ALT: 51 IU/L — ABNORMAL HIGH (ref 0–44)
AST: 28 IU/L (ref 0–40)
Albumin/Globulin Ratio: 2 (ref 1.2–2.2)
Albumin: 4.6 g/dL (ref 3.5–5.5)
Alkaline Phosphatase: 57 IU/L (ref 39–117)
BILIRUBIN TOTAL: 0.6 mg/dL (ref 0.0–1.2)
BUN/Creatinine Ratio: 15 (ref 9–20)
BUN: 15 mg/dL (ref 6–24)
CALCIUM: 9.7 mg/dL (ref 8.7–10.2)
CHLORIDE: 99 mmol/L (ref 96–106)
CO2: 24 mmol/L (ref 18–29)
Creatinine, Ser: 1.01 mg/dL (ref 0.76–1.27)
GFR calc Af Amer: 106 mL/min/{1.73_m2} (ref >59)
GFR calc non Af Amer: 91 mL/min/{1.73_m2} (ref >59)
GLUCOSE: 159 mg/dL — AB (ref 65–99)
Globulin, Total: 2.3 g/dL (ref 1.5–4.5)
POTASSIUM: 4.1 mmol/L (ref 3.5–5.2)
Sodium: 141 mmol/L (ref 134–144)
Total Protein: 6.9 g/dL (ref 6.0–8.5)

## 2017-01-03 LAB — HEMOGLOBIN A1C
ESTIMATED AVERAGE GLUCOSE: 194 mg/dL
Hgb A1c MFr Bld: 8.4 % — ABNORMAL HIGH (ref 4.8–5.6)

## 2017-01-03 LAB — CBC WITH DIFFERENTIAL/PLATELET
BASOS ABS: 0 10*3/uL (ref 0.0–0.2)
Basos: 1 %
EOS (ABSOLUTE): 0.1 10*3/uL (ref 0.0–0.4)
Eos: 1 %
Hematocrit: 47 % (ref 37.5–51.0)
Hemoglobin: 15.6 g/dL (ref 13.0–17.7)
IMMATURE GRANULOCYTES: 0 %
Immature Grans (Abs): 0 10*3/uL (ref 0.0–0.1)
Lymphocytes Absolute: 2.6 10*3/uL (ref 0.7–3.1)
Lymphs: 53 %
MCH: 27.8 pg (ref 26.6–33.0)
MCHC: 33.2 g/dL (ref 31.5–35.7)
MCV: 84 fL (ref 79–97)
MONOS ABS: 0.5 10*3/uL (ref 0.1–0.9)
Monocytes: 10 %
NEUTROS PCT: 35 %
Neutrophils Absolute: 1.7 10*3/uL (ref 1.4–7.0)
PLATELETS: 269 10*3/uL (ref 150–379)
RBC: 5.62 x10E6/uL (ref 4.14–5.80)
RDW: 14.5 % (ref 12.3–15.4)
WBC: 5 10*3/uL (ref 3.4–10.8)

## 2017-01-03 LAB — T4, FREE: Free T4: 1.36 ng/dL (ref 0.82–1.77)

## 2017-01-03 LAB — TSH: TSH: 4.16 u[IU]/mL (ref 0.450–4.500)

## 2017-01-10 ENCOUNTER — Encounter: Payer: Self-pay | Admitting: Physician Assistant

## 2017-01-12 MED ORDER — DAPAGLIFLOZIN PROPANEDIOL 10 MG PO TABS: 10.0000 mg | ORAL_TABLET | Freq: Every day | ORAL | 3 refills | Status: DC

## 2017-01-12 NOTE — Addendum Note (Signed)
Addended by: Harrison Mons on: 01/12/2017 01:34 PM   Modules accepted: Orders

## 2017-01-23 ENCOUNTER — Other Ambulatory Visit: Payer: Self-pay | Admitting: Physician Assistant

## 2017-01-23 DIAGNOSIS — E1165 Type 2 diabetes mellitus with hyperglycemia: Secondary | ICD-10-CM

## 2017-01-23 DIAGNOSIS — E118 Type 2 diabetes mellitus with unspecified complications: Principal | ICD-10-CM

## 2017-01-23 DIAGNOSIS — IMO0002 Reserved for concepts with insufficient information to code with codable children: Secondary | ICD-10-CM

## 2017-02-02 ENCOUNTER — Other Ambulatory Visit: Payer: Self-pay | Admitting: Physician Assistant

## 2017-02-02 DIAGNOSIS — E782 Mixed hyperlipidemia: Secondary | ICD-10-CM

## 2017-03-08 ENCOUNTER — Other Ambulatory Visit: Payer: Self-pay | Admitting: Physician Assistant

## 2017-03-08 DIAGNOSIS — J309 Allergic rhinitis, unspecified: Secondary | ICD-10-CM

## 2017-04-17 ENCOUNTER — Ambulatory Visit (INDEPENDENT_AMBULATORY_CARE_PROVIDER_SITE_OTHER): Payer: Commercial Managed Care - PPO | Admitting: Physician Assistant

## 2017-04-17 ENCOUNTER — Encounter: Payer: Self-pay | Admitting: Physician Assistant

## 2017-04-17 VITALS — BP 128/84 | HR 96 | Temp 97.8°F | Resp 18 | Ht 68.0 in | Wt 200.0 lb

## 2017-04-17 DIAGNOSIS — E118 Type 2 diabetes mellitus with unspecified complications: Secondary | ICD-10-CM

## 2017-04-17 DIAGNOSIS — E1165 Type 2 diabetes mellitus with hyperglycemia: Secondary | ICD-10-CM | POA: Diagnosis not present

## 2017-04-17 DIAGNOSIS — E782 Mixed hyperlipidemia: Secondary | ICD-10-CM | POA: Diagnosis not present

## 2017-04-17 DIAGNOSIS — IMO0002 Reserved for concepts with insufficient information to code with codable children: Secondary | ICD-10-CM

## 2017-04-17 DIAGNOSIS — I10 Essential (primary) hypertension: Secondary | ICD-10-CM

## 2017-04-17 DIAGNOSIS — Z23 Encounter for immunization: Secondary | ICD-10-CM

## 2017-04-17 DIAGNOSIS — Z6830 Body mass index (BMI) 30.0-30.9, adult: Secondary | ICD-10-CM

## 2017-04-17 MED ORDER — PRAVASTATIN SODIUM 40 MG PO TABS: 40.0000 mg | ORAL_TABLET | Freq: Every day | ORAL | 1 refills | Status: DC

## 2017-04-17 MED ORDER — LOSARTAN POTASSIUM 100 MG PO TABS: 100.0000 mg | ORAL_TABLET | Freq: Every day | ORAL | 1 refills | Status: DC

## 2017-04-17 MED ORDER — NIACIN ER (ANTIHYPERLIPIDEMIC) 500 MG PO TBCR: 500.0000 mg | EXTENDED_RELEASE_TABLET | Freq: Every day | ORAL | 1 refills | Status: DC

## 2017-04-17 MED ORDER — SITAGLIPTIN PHOSPHATE 100 MG PO TABS: 100.0000 mg | ORAL_TABLET | Freq: Every day | ORAL | 1 refills | Status: DC

## 2017-04-17 NOTE — Assessment & Plan Note (Signed)
Tolerating fenofibrate and statin without myalgias. Await labs. Adjust regimen as indicated by results.

## 2017-04-17 NOTE — Assessment & Plan Note (Signed)
Has lost 2 pounds since last visit. Encouraged healthy lifestyle.

## 2017-04-17 NOTE — Patient Instructions (Signed)
     IF you received an x-ray today, you will receive an invoice from [facility]. Please contact [facility] at [phone] with questions or concerns regarding your invoice.   IF you received labwork today, you will receive an invoice from [facility]. Please contact [facility] at [phone] with questions or concerns regarding your invoice.   Our billing staff will not be able to assist you with questions regarding bills from these companies.  You will be contacted with the lab results as soon as they are available. The fastest way to get your results is to activate your My Chart account. Instructions are located on the last page of this paperwork. If you have not heard from us regarding the results in 2 weeks, please contact this office.     

## 2017-04-17 NOTE — Assessment & Plan Note (Addendum)
Controlled. Continue current treatment. 

## 2017-04-17 NOTE — Assessment & Plan Note (Addendum)
Hope to see improvement with increased Farxiga dose. Encouraged exercise outside of work and healthy eating choices. Await labs. Adjust regimen as indicated by results. Would need an additional agent, perhaps Bydureon B-cise.

## 2017-04-17 NOTE — Progress Notes (Signed)
Patient ID: Brad Pineda., male    DOB: 02-23-1976, 41 y.o.   MRN: [id number]  PCP: Harrison Mons, PA-C  Chief Complaint  Patient presents with  . Diabetes  . Hypertension  . Hyperlipidemia  . Follow-up    Subjective:   Presents for evaluation of diabetes, HTN, lipids.  Generally feels well. No problems or concerns to report. A1C has been rising over the last year. When his A1C rose above 8% at last check, we INCREASED the Farxiga from 5 mg to 10 mg. He has noticed a reduced appetite. Tolerating his medications without difficulty. He doesn't check his glucose or BP at home. Isn't exercising intentionally. Has a physical job.   Review of Systems Denies chest pain, shortness of breath, HA, dizziness, vision change, nausea, vomiting, diarrhea, constipation, melena, hematochezia, dysuria, increased urinary urgency or frequency, increased hunger or thirst, unintentional weight change, unexplained myalgias or arthralgias, rash.     Patient Active Problem List   Diagnosis Date Noted  . Glaucoma 08/24/2015  . Nonalcoholic steatohepatitis (NASH) 08/24/2015  . OSA on CPAP 12/10/2013  . BMI 31.0-31.9,adult 05/13/2013  . Diabetes mellitus type 2, uncontrolled, with complications ([place])   . Essential hypertension, benign   . Mixed hyperlipidemia   . GERD (gastroesophageal reflux disease)   . Anxiety      Prior to Admission medications   Medication Sig Start Date End Date Taking? Authorizing Provider  ammonium lactate (LAC-HYDRIN) 12 % lotion Apply 1 application topically 2 (two) times daily 07/06/15  Yes Caidin Heidenreich, PA-C  aspirin 81 MG tablet Take 162 mg by mouth once. Takes 3 qd   Yes [provider]  cetirizine (ZYRTEC) 10 MG tablet Take 10 mg by mouth daily.   Yes [provider]  dapagliflozin propanediol (FARXIGA) 10 MG TABS tablet Take 10 mg by mouth daily. 01/12/17  Yes Lumina Gitto, PA-C  fenofibrate (TRICOR) 145 MG tablet TAKE 1 TABLET  (145 MG TOTAL) BY MOUTH DAILY. 01/02/17  Yes Myah Guynes, PA-C  fish oil-omega-3 fatty acids 1000 MG capsule Take 1 g by mouth daily.    Yes [provider]  fluticasone (FLONASE) 50 MCG/ACT nasal spray Place 2 sprays into both nostrils daily. 08/28/16  Yes Desarai Barrack, PA-C  glipiZIDE (GLUCOTROL XL) 10 MG 24 hr tablet TAKE 1 TABLET (10 MG TOTAL) BY MOUTH DAILY WITH BREAKFAST. 11/02/16  Yes Weber, Sarah L, PA-C  hydrochlorothiazide (HYDRODIURIL) 12.5 MG tablet Take 1 tablet (12.5 mg total) by mouth daily. 01/02/17  Yes Rasmus Preusser, PA-C  ibuprofen (ADVIL,MOTRIN) 200 MG tablet Take 200 mg by mouth every 6 (six) hours as needed.   Yes [provider]  JANUVIA 100 MG tablet TAKE 1 TABLET (100 MG TOTAL) BY MOUTH DAILY. 11/09/16  Yes Jaynee Eagles, PA-C  lansoprazole (PREVACID) 15 MG capsule Take 15 mg by mouth daily.   Yes [provider]  losartan (COZAAR) 100 MG tablet TAKE 1 TABLET (100 MG TOTAL) BY MOUTH DAILY. 11/09/16  Yes Jaynee Eagles, PA-C  metFORMIN (GLUCOPHAGE) 500 MG tablet TAKE 1 TABLET (500 MG TOTAL) BY MOUTH 3 (THREE) TIMES DAILY. 01/23/17  Yes Leona Alen, PA-C  niacin (NIASPAN) 500 MG CR tablet TAKE 1 TABLET (500 MG TOTAL) BY MOUTH DAILY. 11/09/16  Yes Jaynee Eagles, PA-C  PARoxetine (PAXIL) 40 MG tablet TAKE 1 TABLET (40 MG TOTAL) BY MOUTH DAILY. 10/20/16  Yes Nicoletta Hush, PA-C  pravastatin (PRAVACHOL) 40 MG tablet TAKE 1 TABLET (40 MG TOTAL) BY MOUTH DAILY.  11/09/16  Yes Jaynee Eagles, PA-C  ipratropium (ATROVENT) 0.03 % nasal spray Place 2 sprays into both nostrils every 12 (twelve) hours. Patient not taking: Reported on 01/02/2017 04/20/16   Harrison Mons, PA-C     No Known Allergies     Objective:  Physical Exam  Constitutional: He is oriented to person, place, and time. He appears well-developed and well-nourished. He is active and cooperative. No distress.  BP 128/84 (BP Location: Right Arm, Patient Position: Sitting, Cuff Size: Normal)   Pulse 96    Temp 97.8 F (36.6 C) (Oral)   Resp 18   Ht 5' 8"  (1.727 m)   Wt 200 lb (90.7 kg)   SpO2 97%   BMI 30.41 kg/m   HENT:  Head: Normocephalic and atraumatic.  Right Ear: Hearing normal.  Left Ear: Hearing normal.  Eyes: Conjunctivae are normal. No scleral icterus.  Neck: Normal range of motion. Neck supple. No thyromegaly present.  Cardiovascular: Normal rate, regular rhythm and normal heart sounds.   Pulses:      Radial pulses are 2+ on the right side, and 2+ on the left side.  Pulmonary/Chest: Effort normal and breath sounds normal.  Lymphadenopathy:       Head (right side): No tonsillar, no preauricular, no posterior auricular and no occipital adenopathy present.       Head (left side): No tonsillar, no preauricular, no posterior auricular and no occipital adenopathy present.    He has no cervical adenopathy.       Right: No supraclavicular adenopathy present.       Left: No supraclavicular adenopathy present.  Neurological: He is alert and oriented to person, place, and time. No sensory deficit.  Skin: Skin is warm, dry and intact. No rash noted. No cyanosis or erythema. Nails show no clubbing.  Psychiatric: He has a normal mood and affect. His speech is normal and behavior is normal.    Wt Readings from Last 3 Encounters:  04/17/17 200 lb (90.7 kg)  01/02/17 202 lb (91.6 kg)  10/03/16 205 lb (93 kg)       Assessment & Plan:   Problem List Items Addressed This Visit    Diabetes mellitus type 2, uncontrolled, with complications ([place]) - Primary    Hope to see improvement with increased Farxiga dose. Encouraged exercise outside of work and healthy eating choices. Await labs. Adjust regimen as indicated by results. Would need an additional agent, perhaps Bydureon B-cise.       Relevant Medications   pravastatin (PRAVACHOL) 40 MG tablet   losartan (COZAAR) 100 MG tablet   sitaGLIPtin (JANUVIA) 100 MG tablet   Other Relevant Orders   Comprehensive metabolic panel    Hemoglobin A1c   Microalbumin / creatinine urine ratio   Essential hypertension, benign    Controlled. Continue current treatment.      Relevant Medications   pravastatin (PRAVACHOL) 40 MG tablet   niacin (NIASPAN) 500 MG CR tablet   losartan (COZAAR) 100 MG tablet   Other Relevant Orders   CBC with Differential/Platelet   Comprehensive metabolic panel   TSH   Mixed hyperlipidemia    Tolerating fenofibrate and statin without myalgias. Await labs. Adjust regimen as indicated by results.       Relevant Medications   pravastatin (PRAVACHOL) 40 MG tablet   niacin (NIASPAN) 500 MG CR tablet   losartan (COZAAR) 100 MG tablet   Other Relevant Orders   Comprehensive metabolic panel   Lipid panel   BMI 30.0-30.9,adult  Has lost 2 pounds since last visit. Encouraged healthy lifestyle.       Other Visit Diagnoses    Need for influenza vaccination       Relevant Orders   Flu Vaccine QUAD 36+ mos IM (Completed)   Need for Td vaccine       Relevant Orders   Td vaccine greater than or equal to 7yo preservative free IM (Completed)       Return in about 3 months (around 07/17/2017) for re-evaluation of diabetes, cholesterol and blood pressure.   Fara Chute, PA-C [facility] at [facility]

## 2017-04-18 LAB — COMPREHENSIVE METABOLIC PANEL
A/G RATIO: 2.2 (ref 1.2–2.2)
ALK PHOS: 54 IU/L (ref 39–117)
ALT: 44 IU/L (ref 0–44)
AST: 22 IU/L (ref 0–40)
Albumin: 4.6 g/dL (ref 3.5–5.5)
BILIRUBIN TOTAL: 0.3 mg/dL (ref 0.0–1.2)
BUN/Creatinine Ratio: 19 (ref 9–20)
BUN: 20 mg/dL (ref 6–24)
CO2: 21 mmol/L (ref 20–29)
Calcium: 9.9 mg/dL (ref 8.7–10.2)
Chloride: 101 mmol/L (ref 96–106)
Creatinine, Ser: 1.07 mg/dL (ref 0.76–1.27)
GFR calc Af Amer: 98 mL/min/{1.73_m2} (ref >59)
GFR, EST NON AFRICAN AMERICAN: 85 mL/min/{1.73_m2} (ref >59)
Globulin, Total: 2.1 g/dL (ref 1.5–4.5)
Glucose: 232 mg/dL — ABNORMAL HIGH (ref 65–99)
Potassium: 3.5 mmol/L (ref 3.5–5.2)
Sodium: 142 mmol/L (ref 134–144)
Total Protein: 6.7 g/dL (ref 6.0–8.5)

## 2017-04-18 LAB — CBC WITH DIFFERENTIAL/PLATELET
Basophils Absolute: 0 10*3/uL (ref 0.0–0.2)
Basos: 1 %
EOS (ABSOLUTE): 0.1 10*3/uL (ref 0.0–0.4)
EOS: 2 %
HEMATOCRIT: 44.9 % (ref 37.5–51.0)
Hemoglobin: 15.3 g/dL (ref 13.0–17.7)
IMMATURE GRANULOCYTES: 0 %
Immature Grans (Abs): 0 10*3/uL (ref 0.0–0.1)
LYMPHS ABS: 2.9 10*3/uL (ref 0.7–3.1)
Lymphs: 59 %
MCH: 28.7 pg (ref 26.6–33.0)
MCHC: 34.1 g/dL (ref 31.5–35.7)
MCV: 84 fL (ref 79–97)
MONOS ABS: 0.4 10*3/uL (ref 0.1–0.9)
Monocytes: 9 %
NEUTROS PCT: 29 %
Neutrophils Absolute: 1.4 10*3/uL (ref 1.4–7.0)
PLATELETS: 242 10*3/uL (ref 150–379)
RBC: 5.33 x10E6/uL (ref 4.14–5.80)
RDW: 14.6 % (ref 12.3–15.4)
WBC: 4.8 10*3/uL (ref 3.4–10.8)

## 2017-04-18 LAB — MICROALBUMIN / CREATININE URINE RATIO: Creatinine, Urine: 66.1 mg/dL

## 2017-04-18 LAB — HEMOGLOBIN A1C
ESTIMATED AVERAGE GLUCOSE: 157 mg/dL
HEMOGLOBIN A1C: 7.1 % — AB (ref 4.8–5.6)

## 2017-04-18 LAB — LIPID PANEL
CHOLESTEROL TOTAL: 129 mg/dL (ref 100–199)
Chol/HDL Ratio: 5.4 ratio — ABNORMAL HIGH (ref 0.0–5.0)
HDL: 24 mg/dL — AB (ref >39)
LDL Calculated: 67 mg/dL (ref 0–99)
TRIGLYCERIDES: 188 mg/dL — AB (ref 0–149)
VLDL CHOLESTEROL CAL: 38 mg/dL (ref 5–40)

## 2017-04-18 LAB — TSH: TSH: 6.18 u[IU]/mL — ABNORMAL HIGH (ref 0.450–4.500)

## 2017-04-18 MED ORDER — DAPAGLIFLOZIN PROPANEDIOL 5 MG PO TABS: 5.0000 mg | ORAL_TABLET | Freq: Every day | ORAL | 3 refills | Status: DC

## 2017-04-18 NOTE — Addendum Note (Signed)
Addended by: Fara Chute on: 04/18/2017 01:48 PM   Modules accepted: Orders

## 2017-04-23 ENCOUNTER — Other Ambulatory Visit: Payer: Self-pay | Admitting: Physician Assistant

## 2017-04-23 MED ORDER — DULAGLUTIDE 0.75 MG/0.5ML ~~LOC~~ SOAJ: 0.7500 mg | SUBCUTANEOUS | 3 refills | Status: DC

## 2017-04-24 ENCOUNTER — Telehealth: Payer: Self-pay | Admitting: Family Medicine

## 2017-04-24 NOTE — Telephone Encounter (Signed)
Yes. Form was completed and faxed.

## 2017-07-17 ENCOUNTER — Ambulatory Visit: Payer: Commercial Managed Care - PPO | Admitting: Physician Assistant

## 2017-07-18 ENCOUNTER — Other Ambulatory Visit: Payer: Self-pay

## 2017-07-18 ENCOUNTER — Encounter: Payer: Self-pay | Admitting: Physician Assistant

## 2017-07-18 ENCOUNTER — Ambulatory Visit: Payer: Commercial Managed Care - PPO | Admitting: Physician Assistant

## 2017-07-18 VITALS — BP 120/80 | HR 88 | Temp 97.8°F | Resp 18 | Ht 68.0 in | Wt 201.4 lb

## 2017-07-18 DIAGNOSIS — J019 Acute sinusitis, unspecified: Secondary | ICD-10-CM

## 2017-07-18 DIAGNOSIS — I10 Essential (primary) hypertension: Secondary | ICD-10-CM | POA: Diagnosis not present

## 2017-07-18 DIAGNOSIS — J309 Allergic rhinitis, unspecified: Secondary | ICD-10-CM | POA: Diagnosis not present

## 2017-07-18 DIAGNOSIS — E118 Type 2 diabetes mellitus with unspecified complications: Secondary | ICD-10-CM | POA: Diagnosis not present

## 2017-07-18 DIAGNOSIS — E782 Mixed hyperlipidemia: Secondary | ICD-10-CM | POA: Diagnosis not present

## 2017-07-18 DIAGNOSIS — E1165 Type 2 diabetes mellitus with hyperglycemia: Secondary | ICD-10-CM | POA: Diagnosis not present

## 2017-07-18 DIAGNOSIS — IMO0002 Reserved for concepts with insufficient information to code with codable children: Secondary | ICD-10-CM

## 2017-07-18 MED ORDER — AMOXICILLIN-POT CLAVULANATE 875-125 MG PO TABS: 1.0000 | ORAL_TABLET | Freq: Two times a day (BID) | ORAL | 0 refills | Status: AC

## 2017-07-18 MED ORDER — BENZONATATE 100 MG PO CAPS: 100.0000 mg | ORAL_CAPSULE | Freq: Three times a day (TID) | ORAL | 0 refills | Status: DC | PRN

## 2017-07-18 MED ORDER — IPRATROPIUM BROMIDE 0.03 % NA SOLN: 2.0000 | Freq: Two times a day (BID) | NASAL | 3 refills | Status: AC

## 2017-07-18 NOTE — Progress Notes (Signed)
Chief Complaint  Patient presents with  . Diabetes  . Hyperlipidemia  . Hypertension  . Follow-up  . Cough    x1 week, pt states he has a stuffy nose and is coughing up phlegm   Subjective:    Patient ID: Brad Capes., male    DOB: Apr 16, 1976, 41 y.o.   MRN: [id number]  HPI  Patient presents for evaluation of diabetes, hypertension, hyperlipidemia and upper respiratory symptoms.  Patient states that he has been doing well since his last visit. He has not been checking his glucose or blood pressure at home. He is taking all his current medications as prescribed. No side effects.  He does not exercise, no particular reason. He has a treadmill at home.     He states that 07/11/17 he had a sore throat, cough, and a stuffy nose. He is occasionally coughing up brownish phlegm. Symptoms are still present today. He has not tried taking any medications for these symptoms.  Review of Systems  Constitutional: Positive for fatigue (due to URI symptoms). Negative for appetite change, chills, fever and unexpected weight change.  HENT: Positive for congestion, sinus pressure (both maxillary and frontal sinuses) and sore throat. Negative for ear pain and rhinorrhea.   Eyes: Negative for pain and visual disturbance.  Respiratory: Positive for cough and shortness of breath (related to URI symptoms). Negative for wheezing.   Cardiovascular: Negative for chest pain and palpitations.  Gastrointestinal: Negative for abdominal pain, constipation, diarrhea, nausea and vomiting.  Genitourinary: Negative for dysuria, frequency, hematuria and urgency.  Neurological: Positive for headaches (in the past week with URI symptoms. Pain is around frontal sinuses). Negative for light-headedness.   Patient Active Problem List   Diagnosis Date Noted  . Glaucoma 08/24/2015  . Nonalcoholic steatohepatitis (NASH) 08/24/2015  . OSA on CPAP 12/10/2013  . BMI 30.0-30.9,adult 05/13/2013  . Diabetes mellitus type 2,  uncontrolled, with complications ([place])   . Essential hypertension, benign   . Mixed hyperlipidemia   . GERD (gastroesophageal reflux disease)   . Anxiety    Current Outpatient Medications on File Prior to Visit  Medication Sig Dispense Refill  . ammonium lactate (LAC-HYDRIN) 12 % lotion Apply 1 application topically 2 (two) times daily 400 g 0  . aspirin 81 MG tablet Take 162 mg by mouth once. Takes 3 qd    . cetirizine (ZYRTEC) 10 MG tablet Take 10 mg by mouth daily.    . dapagliflozin propanediol (FARXIGA) 10 MG TABS tablet Take 10 mg by mouth daily. 90 tablet 3  . Dulaglutide (TRULICITY) 7.67 MC/9.4BS SOPN Inject 0.75 mg into the skin once a week. 12 pen 3  . fenofibrate (TRICOR) 145 MG tablet TAKE 1 TABLET (145 MG TOTAL) BY MOUTH DAILY. 90 tablet 3  . fish oil-omega-3 fatty acids 1000 MG capsule Take 1 g by mouth daily.     . fluticasone (FLONASE) 50 MCG/ACT nasal spray Place 2 sprays into both nostrils daily. 16 g 12  . glipiZIDE (GLUCOTROL XL) 10 MG 24 hr tablet TAKE 1 TABLET (10 MG TOTAL) BY MOUTH DAILY WITH BREAKFAST. 90 tablet 3  . hydrochlorothiazide (HYDRODIURIL) 12.5 MG tablet Take 1 tablet (12.5 mg total) by mouth daily. 90 tablet 3  . ibuprofen (ADVIL,MOTRIN) 200 MG tablet Take 200 mg by mouth every 6 (six) hours as needed.    . lansoprazole (PREVACID) 15 MG capsule Take 15 mg by mouth daily.    Marland Kitchen losartan (COZAAR) 100 MG tablet Take 1 tablet (100 mg total)  by mouth daily. 90 tablet 1  . metFORMIN (GLUCOPHAGE) 500 MG tablet TAKE 1 TABLET (500 MG TOTAL) BY MOUTH 3 (THREE) TIMES DAILY. 270 tablet 1  . niacin (NIASPAN) 500 MG CR tablet Take 1 tablet (500 mg total) by mouth daily. 90 tablet 1  . PARoxetine (PAXIL) 40 MG tablet TAKE 1 TABLET (40 MG TOTAL) BY MOUTH DAILY. 90 tablet 3  . pravastatin (PRAVACHOL) 40 MG tablet Take 1 tablet (40 mg total) by mouth daily. 90 tablet 1  . sitaGLIPtin (JANUVIA) 100 MG tablet Take 1 tablet (100 mg total) by mouth daily. 90 tablet 1   No  current facility-administered medications on file prior to visit.    No Known Allergies    Objective:  Vitals:   07/18/17 1405 07/18/17 1500  BP: 120/80   Pulse: (!) 104 88  Resp: 18   Temp: 97.8 F (36.6 C)   SpO2: 97%      Physical Exam  Constitutional: He appears well-developed and well-nourished.  HENT:  Head: Normocephalic and atraumatic.  Right Ear: External ear normal.  Left Ear: External ear normal.  Tenderness to palpation of bilateral maxillary and frontal sinuses  Eyes: Conjunctivae are normal.  Neck: Neck supple.  Cardiovascular: Normal rate, regular rhythm, normal heart sounds and intact distal pulses.  Pulmonary/Chest: Effort normal and breath sounds normal. He has no wheezes.  Lymphadenopathy:    He has no cervical adenopathy.  Neurological: He is alert.  Skin: Skin is warm.  Psychiatric: He has a normal mood and affect.      Assessment & Plan:  1. Acute sinusitis, recurrence not specified, unspecified location Discussed treatment with patient. - amoxicillin-clavulanate (AUGMENTIN) 875-125 MG tablet; Take 1 tablet by mouth 2 (two) times daily for 10 days.  Dispense: 20 tablet; Refill: 0 - benzonatate (TESSALON) 100 MG capsule; Take 1-2 capsules (100-200 mg total) by mouth 3 (three) times daily as needed for cough.  Dispense: 40 capsule; Refill: 0  2. Essential hypertension, benign Controlled. Continue current medication regimen - CBC with Differential/Platelet - Comprehensive metabolic panel - TSH  3. Diabetes mellitus type 2, uncontrolled, with complications ([place]) Assess for control based on lab results. Discussed lifestyle changes such as eating a healthy diet and exercising. - Comprehensive metabolic panel - Hemoglobin A1c  4. Mixed hyperlipidemia Assess control based on lab results. - Lipid panel  5. Allergic rhinitis Stable. Continue current medication regimen. - ipratropium (ATROVENT) 0.03 % nasal spray; Place 2 sprays into both nostrils  every 12 (twelve) hours.  Dispense: 90 mL; Refill: 3  Follow up in 3 months for re-evaluation of diabetes, blood pressure, and cholesterol.   [place], [place]

## 2017-07-18 NOTE — Progress Notes (Signed)
Patient ID: Brad Marney., male    DOB: February 17, 1976, 41 y.o.   MRN: [id number]  PCP: Harrison Mons, PA-C  Chief Complaint  Patient presents with  . Diabetes  . Hyperlipidemia  . Hypertension  . Follow-up  . Cough    x1 week, pt states he has a stuffy nose and is coughing up phlegm    Subjective:   Presents for routine evaluation of diabetes, HTN and lipids.  No exercise, does not check home glucose or BP. Tolerates his medications without difficulty. Anxiety seems fine. Not motivated to change lifestyle. Has a treadmill at home, not using it because of "laziness."  07/11/2017 developed sore throat, cough and nasal congestion. Cough is productive of brownish sputum. Frontal headache. Persists. He has not tried any treatment.  Review of Systems As above. No GI/GU symptoms. No polydipsia, polyphagia.    Patient Active Problem List   Diagnosis Date Noted  . Glaucoma 08/24/2015  . Nonalcoholic steatohepatitis (NASH) 08/24/2015  . OSA on CPAP 12/10/2013  . BMI 30.0-30.9,adult 05/13/2013  . Diabetes mellitus type 2, uncontrolled, with complications ([place])   . Essential hypertension, benign   . Mixed hyperlipidemia   . GERD (gastroesophageal reflux disease)   . Anxiety      Prior to Admission medications   Medication Sig Start Date End Date Taking? Authorizing Provider  ammonium lactate (LAC-HYDRIN) 12 % lotion Apply 1 application topically 2 (two) times daily 07/06/15  Yes Ianna Salmela, PA-C  aspirin 81 MG tablet Take 162 mg by mouth once. Takes 3 qd   Yes [provider]  cetirizine (ZYRTEC) 10 MG tablet Take 10 mg by mouth daily.   Yes [provider]  dapagliflozin propanediol (FARXIGA) 10 MG TABS tablet Take 10 mg by mouth daily. 01/12/17  Yes Glennda Weatherholtz, PA-C  Dulaglutide (TRULICITY) 6.65 LD/3.5TS SOPN Inject 0.75 mg into the skin once a week. 04/23/17  Yes Larin Weissberg, PA-C  fenofibrate (TRICOR) 145 MG tablet TAKE 1 TABLET  (145 MG TOTAL) BY MOUTH DAILY. 01/02/17  Yes Leanna Hamid, PA-C  fish oil-omega-3 fatty acids 1000 MG capsule Take 1 g by mouth daily.    Yes [provider]  fluticasone (FLONASE) 50 MCG/ACT nasal spray Place 2 sprays into both nostrils daily. 08/28/16  Yes Tyreonna Czaplicki, PA-C  glipiZIDE (GLUCOTROL XL) 10 MG 24 hr tablet TAKE 1 TABLET (10 MG TOTAL) BY MOUTH DAILY WITH BREAKFAST. 11/02/16  Yes Weber, Sarah L, PA-C  hydrochlorothiazide (HYDRODIURIL) 12.5 MG tablet Take 1 tablet (12.5 mg total) by mouth daily. 01/02/17  Yes Royanne Warshaw, PA-C  ibuprofen (ADVIL,MOTRIN) 200 MG tablet Take 200 mg by mouth every 6 (six) hours as needed.   Yes [provider]  lansoprazole (PREVACID) 15 MG capsule Take 15 mg by mouth daily.   Yes [provider]  losartan (COZAAR) 100 MG tablet Take 1 tablet (100 mg total) by mouth daily. 04/17/17  Yes Caelum Federici, PA-C  metFORMIN (GLUCOPHAGE) 500 MG tablet TAKE 1 TABLET (500 MG TOTAL) BY MOUTH 3 (THREE) TIMES DAILY. 01/23/17  Yes Oryon Gary, PA-C  niacin (NIASPAN) 500 MG CR tablet Take 1 tablet (500 mg total) by mouth daily. 04/17/17  Yes Jahmia Berrett, PA-C  PARoxetine (PAXIL) 40 MG tablet TAKE 1 TABLET (40 MG TOTAL) BY MOUTH DAILY. 10/20/16  Yes Pauleen Goleman, PA-C  pravastatin (PRAVACHOL) 40 MG tablet Take 1 tablet (40 mg total) by mouth daily. 04/17/17  Yes Vallen Calabrese, PA-C  sitaGLIPtin (JANUVIA) 100 MG  tablet Take 1 tablet (100 mg total) by mouth daily. 04/17/17  Yes Tacia Hindley, PA-C  ipratropium (ATROVENT) 0.03 % nasal spray Place 2 sprays into both nostrils every 12 (twelve) hours. Patient not taking: Reported on 01/02/2017 04/20/16   Harrison Mons, PA-C     No Known Allergies     Objective:  Physical Exam  Constitutional: He is oriented to person, place, and time. He appears well-developed and well-nourished. No distress.  BP 120/80 (BP Location: Right Arm, Patient Position: Sitting, Cuff Size: Large)   Pulse  88   Temp 97.8 F (36.6 C) (Oral)   Resp 18   Ht 5' 8"  (1.727 m)   Wt 201 lb 6.4 oz (91.4 kg)   SpO2 97%   BMI 30.62 kg/m    HENT:  Head: Normocephalic and atraumatic.  Right Ear: Hearing, tympanic membrane, external ear and ear canal normal.  Left Ear: Hearing, tympanic membrane, external ear and ear canal normal.  Nose: Mucosal edema and rhinorrhea present.  No foreign bodies. Right sinus exhibits maxillary sinus tenderness and frontal sinus tenderness. Left sinus exhibits maxillary sinus tenderness and frontal sinus tenderness.  Mouth/Throat: Uvula is midline, oropharynx is clear and moist and mucous membranes are normal. No uvula swelling. No oropharyngeal exudate.  Eyes: Conjunctivae and EOM are normal. Pupils are equal, round, and reactive to light. Right eye exhibits no discharge. Left eye exhibits no discharge. No scleral icterus.  Neck: Trachea normal, normal range of motion and full passive range of motion without pain. Neck supple. No thyroid mass and no thyromegaly present.  Cardiovascular: Normal rate, regular rhythm and normal heart sounds.  Pulmonary/Chest: Effort normal and breath sounds normal.  Lymphadenopathy:       Head (right side): No submandibular, no tonsillar, no preauricular, no posterior auricular and no occipital adenopathy present.       Head (left side): No submandibular, no tonsillar, no preauricular and no occipital adenopathy present.    He has no cervical adenopathy.       Right: No supraclavicular adenopathy present.       Left: No supraclavicular adenopathy present.  Neurological: He is alert and oriented to person, place, and time. He has normal strength. No cranial nerve deficit or sensory deficit.  Skin: Skin is warm, dry and intact. No rash noted.  Psychiatric: He has a normal mood and affect. His speech is normal and behavior is normal.           Assessment & Plan:   Problem List Items Addressed This Visit    Diabetes mellitus type 2,  uncontrolled, with complications ([place])    Await lab results. Adjust regimen as indicated for A1C >7%.      Relevant Orders   Comprehensive metabolic panel (Completed)   Hemoglobin A1c (Completed)   Essential hypertension, benign    CoNTROLLED. Continue current treatment.      Relevant Orders   CBC with Differential/Platelet (Completed)   Comprehensive metabolic panel (Completed)   TSH (Completed)   Mixed hyperlipidemia    Tolerating current treatment. Await lab results.      Relevant Orders   Lipid panel (Completed)    Other Visit Diagnoses    Acute sinusitis, recurrence not specified, unspecified location    -  Primary   Supportive care. Anticipatory guidance provided.   Relevant Medications   amoxicillin-clavulanate (AUGMENTIN) 875-125 MG tablet   benzonatate (TESSALON) 100 MG capsule   ipratropium (ATROVENT) 0.03 % nasal spray   Allergic rhinitis  Relevant Medications   ipratropium (ATROVENT) 0.03 % nasal spray       Return in about 3 months (around 10/16/2017) for re-evaluation of diabetes, blood pressure, cholesterol.   Fara Chute, PA-C Primary Care at [place]

## 2017-07-18 NOTE — Patient Instructions (Addendum)
Use that treadmill! Figure out what you need to do to get over the inertia!    IF you received an x-ray today, you will receive an invoice from [facility]. Please contact [facility] at [phone] with questions or concerns regarding your invoice.   IF you received labwork today, you will receive an invoice from [facility]. Please contact [facility] at [phone] with questions or concerns regarding your invoice.   Our billing staff will not be able to assist you with questions regarding bills from these companies.  You will be contacted with the lab results as soon as they are available. The fastest way to get your results is to activate your My Chart account. Instructions are located on the last page of this paperwork. If you have not heard from [place] regarding the results in 2 weeks, please contact this office.

## 2017-07-19 ENCOUNTER — Other Ambulatory Visit: Payer: Self-pay | Admitting: Physician Assistant

## 2017-07-19 DIAGNOSIS — J0191 Acute recurrent sinusitis, unspecified: Secondary | ICD-10-CM

## 2017-07-19 LAB — CBC WITH DIFFERENTIAL/PLATELET
BASOS ABS: 0 10*3/uL (ref 0.0–0.2)
Basos: 1 %
EOS (ABSOLUTE): 0.1 10*3/uL (ref 0.0–0.4)
Eos: 1 %
HEMOGLOBIN: 15.5 g/dL (ref 13.0–17.7)
Hematocrit: 44.8 % (ref 37.5–51.0)
Immature Grans (Abs): 0 10*3/uL (ref 0.0–0.1)
Immature Granulocytes: 0 %
LYMPHS ABS: 3.4 10*3/uL — AB (ref 0.7–3.1)
LYMPHS: 55 %
MCH: 28.7 pg (ref 26.6–33.0)
MCHC: 34.6 g/dL (ref 31.5–35.7)
MCV: 83 fL (ref 79–97)
MONOCYTES: 9 %
Monocytes Absolute: 0.6 10*3/uL (ref 0.1–0.9)
Neutrophils Absolute: 2.1 10*3/uL (ref 1.4–7.0)
Neutrophils: 34 %
Platelets: 292 10*3/uL (ref 150–379)
RBC: 5.4 x10E6/uL (ref 4.14–5.80)
RDW: 14.3 % (ref 12.3–15.4)
WBC: 6.2 10*3/uL (ref 3.4–10.8)

## 2017-07-19 LAB — TSH: TSH: 3.48 u[IU]/mL (ref 0.450–4.500)

## 2017-07-19 LAB — COMPREHENSIVE METABOLIC PANEL
ALBUMIN: 4.6 g/dL (ref 3.5–5.5)
ALK PHOS: 49 IU/L (ref 39–117)
ALT: 47 IU/L — AB (ref 0–44)
AST: 28 IU/L (ref 0–40)
Albumin/Globulin Ratio: 2 (ref 1.2–2.2)
BUN/Creatinine Ratio: 11 (ref 9–20)
BUN: 12 mg/dL (ref 6–24)
Bilirubin Total: 0.3 mg/dL (ref 0.0–1.2)
CO2: 26 mmol/L (ref 20–29)
CREATININE: 1.05 mg/dL (ref 0.76–1.27)
Calcium: 9.6 mg/dL (ref 8.7–10.2)
Chloride: 101 mmol/L (ref 96–106)
GFR calc Af Amer: 100 mL/min/{1.73_m2} (ref >59)
GFR calc non Af Amer: 87 mL/min/{1.73_m2} (ref >59)
Globulin, Total: 2.3 g/dL (ref 1.5–4.5)
Glucose: 65 mg/dL (ref 65–99)
Potassium: 3.7 mmol/L (ref 3.5–5.2)
Sodium: 142 mmol/L (ref 134–144)
Total Protein: 6.9 g/dL (ref 6.0–8.5)

## 2017-07-19 LAB — LIPID PANEL
CHOLESTEROL TOTAL: 134 mg/dL (ref 100–199)
Chol/HDL Ratio: 5 ratio (ref 0.0–5.0)
HDL: 27 mg/dL — AB (ref >39)
LDL Calculated: 82 mg/dL (ref 0–99)
Triglycerides: 123 mg/dL (ref 0–149)
VLDL CHOLESTEROL CAL: 25 mg/dL (ref 5–40)

## 2017-07-19 LAB — HEMOGLOBIN A1C
ESTIMATED AVERAGE GLUCOSE: 148 mg/dL
Hgb A1c MFr Bld: 6.8 % — ABNORMAL HIGH (ref 4.8–5.6)

## 2017-07-19 NOTE — Assessment & Plan Note (Signed)
CoNTROLLED. Continue current treatment.

## 2017-07-19 NOTE — Assessment & Plan Note (Signed)
Tolerating current treatment. Await lab results.

## 2017-07-19 NOTE — Assessment & Plan Note (Signed)
Await lab results. Adjust regimen as indicated for A1C >7%.

## 2017-08-02 ENCOUNTER — Other Ambulatory Visit: Payer: Self-pay | Admitting: Physician Assistant

## 2017-08-02 DIAGNOSIS — E118 Type 2 diabetes mellitus with unspecified complications: Principal | ICD-10-CM

## 2017-08-02 DIAGNOSIS — E1165 Type 2 diabetes mellitus with hyperglycemia: Secondary | ICD-10-CM

## 2017-08-02 DIAGNOSIS — IMO0002 Reserved for concepts with insufficient information to code with codable children: Secondary | ICD-10-CM

## 2017-10-19 ENCOUNTER — Ambulatory Visit: Payer: Commercial Managed Care - PPO | Admitting: Physician Assistant

## 2017-10-19 ENCOUNTER — Telehealth: Payer: Self-pay

## 2017-10-19 NOTE — Telephone Encounter (Signed)
PA Started for Trulicity 3.44ET/0.1FT Pen-Injectors Awaiting Response  Elisabeth Pigeon (Key: Natasha Mead)   Your demographic data has been sent to the plan successfully. They will respond with your clinical questions and you will be notified by email when available within the next business day. You can also check for an update later by opening this request from your dashboard. Please do not fax or call the plan to resubmit this request. If you need assistance, please chat with CoverMyMeds or call us at [phone].

## 2017-10-24 ENCOUNTER — Telehealth: Payer: Self-pay | Admitting: Physician Assistant

## 2017-10-24 NOTE — Telephone Encounter (Signed)
Copied from [place] [id number]. Topic: Quick Communication - See Telephone Encounter >> Oct 24, 2017  1:14 PM Burnis Medin, NT wrote: CRM for notification. See Telephone encounter for: Brad Pineda is calling to check the status for PA for Dulaglutide (TRULICITY) 7.58 IT/2.5QD SOPN. Also she said it was more information that was needed before the medication can be filled.   10/24/17.

## 2017-10-26 ENCOUNTER — Ambulatory Visit: Payer: Commercial Managed Care - PPO | Admitting: Physician Assistant

## 2017-10-26 ENCOUNTER — Other Ambulatory Visit: Payer: Self-pay | Admitting: Physician Assistant

## 2017-10-26 DIAGNOSIS — IMO0002 Reserved for concepts with insufficient information to code with codable children: Secondary | ICD-10-CM

## 2017-10-26 DIAGNOSIS — E1165 Type 2 diabetes mellitus with hyperglycemia: Secondary | ICD-10-CM

## 2017-10-26 DIAGNOSIS — E118 Type 2 diabetes mellitus with unspecified complications: Secondary | ICD-10-CM

## 2017-10-26 DIAGNOSIS — I10 Essential (primary) hypertension: Secondary | ICD-10-CM

## 2017-10-26 DIAGNOSIS — E782 Mixed hyperlipidemia: Secondary | ICD-10-CM

## 2017-10-26 NOTE — Telephone Encounter (Signed)
PA for Trulicity Denied. Notice of Adverse Determination placed in box

## 2017-10-30 NOTE — Telephone Encounter (Signed)
Prior authorization for Trulicity denied.   Prior authorization criteria is below:  "- You have tried metformin either it did not work for you or you cannot use it - You require combination therapy and you have an A1c (hemoglobin A1c) of 7.5 percent or greater"  Provider, please advise.

## 2017-10-31 ENCOUNTER — Encounter: Payer: Self-pay | Admitting: Physician Assistant

## 2017-10-31 ENCOUNTER — Other Ambulatory Visit: Payer: Self-pay

## 2017-10-31 ENCOUNTER — Ambulatory Visit: Payer: Commercial Managed Care - PPO | Admitting: Physician Assistant

## 2017-10-31 VITALS — BP 110/66 | HR 99 | Temp 97.8°F | Resp 16 | Ht 68.0 in | Wt 197.0 lb

## 2017-10-31 DIAGNOSIS — F419 Anxiety disorder, unspecified: Secondary | ICD-10-CM | POA: Diagnosis not present

## 2017-10-31 DIAGNOSIS — I10 Essential (primary) hypertension: Secondary | ICD-10-CM

## 2017-10-31 DIAGNOSIS — Z6830 Body mass index (BMI) 30.0-30.9, adult: Secondary | ICD-10-CM | POA: Diagnosis not present

## 2017-10-31 DIAGNOSIS — E118 Type 2 diabetes mellitus with unspecified complications: Secondary | ICD-10-CM | POA: Diagnosis not present

## 2017-10-31 DIAGNOSIS — K7581 Nonalcoholic steatohepatitis (NASH): Secondary | ICD-10-CM | POA: Diagnosis not present

## 2017-10-31 DIAGNOSIS — E782 Mixed hyperlipidemia: Secondary | ICD-10-CM

## 2017-10-31 NOTE — Progress Notes (Signed)
Patient ID: Brad Pineda., male    DOB: Nov 16, 1975, 42 y.o.   MRN: [id number]  PCP: Harrison Mons, PA-C  Chief Complaint  Patient presents with  . Hyperlipidemia    follow up  . Hypertension    follow up   . Diabetes    follow up     Subjective:   Presents for evaluation of diabetes, HTN, hyperlipidemia.  Overall, he feels well, and is tolerating his medications. Last labs 07/18/2017: A1C 6.8% (down from 7.1 08/6071 added Trulicity, down from 8.4 12/2016 increased Farxiga) LDL 82, HDL 32  Started Trulicity in 02/1061, when A1C was 6.9% Ran out of Trulicity 3 weeks ago. Insurance no longer covers it. They allow Trulicity only when patient doesn't tolerate or cannot take metformin OR requires combination therapy and has A1C >7.5%. Recall that he has not tolerated higher doses of metformin, due to diarrhea. We have been able to increase to the current dose of 500 mg TID.  Has resumed ipratropium bromide NS with increased allergy symptoms about 2 weeks ago, with improvement.   Review of Systems  Constitutional: Negative for activity change, appetite change, fatigue and unexpected weight change.  HENT: Negative for congestion, dental problem, ear pain, hearing loss, mouth sores, postnasal drip, rhinorrhea, sneezing, sore throat, tinnitus and trouble swallowing.   Eyes: Negative for photophobia, pain, redness and visual disturbance.  Respiratory: Negative for cough, chest tightness and shortness of breath.   Cardiovascular: Negative for chest pain, palpitations and leg swelling.  Gastrointestinal: Negative for abdominal pain, blood in stool, constipation, diarrhea, nausea and vomiting.  Endocrine: Negative for cold intolerance, heat intolerance, polydipsia, polyphagia and polyuria.  Genitourinary: Negative for dysuria, frequency, hematuria and urgency.  Musculoskeletal: Negative for arthralgias, gait problem, myalgias and neck stiffness.  Skin: Negative for rash.    Allergic/Immunologic: Positive for environmental allergies.  Neurological: Negative for dizziness, speech difficulty, weakness, light-headedness, numbness and headaches.  Hematological: Negative for adenopathy.  Psychiatric/Behavioral: Negative for confusion and sleep disturbance. The patient is not nervous/anxious.        Patient Active Problem List   Diagnosis Date Noted  . Glaucoma 08/24/2015  . Nonalcoholic steatohepatitis (NASH) 08/24/2015  . OSA on CPAP 12/10/2013  . BMI 30.0-30.9,adult 05/13/2013  . Diabetes mellitus type 2, uncontrolled, with complications ([place])   . Essential hypertension, benign   . Mixed hyperlipidemia   . GERD (gastroesophageal reflux disease)   . Anxiety      Prior to Admission medications   Medication Sig Start Date End Date Taking? Authorizing Provider  ammonium lactate (LAC-HYDRIN) 12 % lotion Apply 1 application topically 2 (two) times daily 07/06/15  Yes Maccoy Haubner, PA-C  aspirin 81 MG tablet Take 162 mg by mouth once. Takes 3 qd   Yes [provider]  cetirizine (ZYRTEC) 10 MG tablet Take 10 mg by mouth daily.   Yes [provider]  dapagliflozin propanediol (FARXIGA) 10 MG TABS tablet Take 10 mg by mouth daily. 01/12/17  Yes Estrella Alcaraz, PA-C  fenofibrate (TRICOR) 145 MG tablet TAKE 1 TABLET (145 MG TOTAL) BY MOUTH DAILY. 01/02/17  Yes Jachai Okazaki, PA-C  fish oil-omega-3 fatty acids 1000 MG capsule Take 1 g by mouth daily.    Yes [provider]  fluticasone (FLONASE) 50 MCG/ACT nasal spray PLACE 2 SPRAYS INTO BOTH NOSTRILS DAILY. 07/19/17  Yes Juanetta Negash, PA-C  glipiZIDE (GLUCOTROL XL) 10 MG 24 hr tablet TAKE 1 TABLET (10 MG TOTAL) BY MOUTH DAILY WITH BREAKFAST. 08/03/17  Yes Earline Stiner, PA-C  hydrochlorothiazide (HYDRODIURIL) 12.5 MG tablet Take 1 tablet (12.5 mg total) by mouth daily. 01/02/17  Yes Ashyla Luth, PA-C  ibuprofen (ADVIL,MOTRIN) 200 MG tablet Take 200 mg by mouth every 6 (six)  hours as needed.   Yes [provider]  ipratropium (ATROVENT) 0.03 % nasal spray Place 2 sprays into both nostrils every 12 (twelve) hours. 07/18/17  Yes Synthia Fairbank, PA-C  JANUVIA 100 MG tablet TAKE 1 TABLET BY MOUTH EVERY DAY 10/26/17  Yes Tiffannie Sloss, PA-C  lansoprazole (PREVACID) 15 MG capsule Take 15 mg by mouth daily.   Yes [provider]  losartan (COZAAR) 100 MG tablet TAKE 1 TABLET BY MOUTH EVERY DAY 10/26/17  Yes Arria Naim, PA-C  metFORMIN (GLUCOPHAGE) 500 MG tablet TAKE 1 TABLET (500 MG TOTAL) BY MOUTH 3 (THREE) TIMES DAILY. 01/23/17  Yes Juel Bellerose, PA-C  niacin (NIASPAN) 500 MG CR tablet TAKE 1 TABLET BY MOUTH EVERY DAY 10/26/17  Yes Diangelo Radel, PA-C  PARoxetine (PAXIL) 40 MG tablet TAKE 1 TABLET (40 MG TOTAL) BY MOUTH DAILY. 08/03/17  Yes Leverne Amrhein, PA-C  pravastatin (PRAVACHOL) 40 MG tablet TAKE 1 TABLET BY MOUTH EVERY DAY 10/26/17  Yes Karthika Glasper, PA-C  benzonatate (TESSALON) 100 MG capsule Take 1-2 capsules (100-200 mg total) by mouth 3 (three) times daily as needed for cough. Patient not taking: Reported on 10/31/2017 07/18/17   Harrison Mons, PA-C  Dulaglutide (TRULICITY) 8.18 HU/3.1SH SOPN Inject 0.75 mg into the skin once a week. Patient not taking: Reported on 10/31/2017 04/23/17   Harrison Mons, PA-C     No Known Allergies     Objective:  Physical Exam  Constitutional: He is oriented to person, place, and time. He appears well-developed and well-nourished. He is active and cooperative. No distress.  BP 110/66   Pulse 99   Temp 97.8 F (36.6 C)   Resp 16   Ht 5' 8"  (1.727 m)   Wt 197 lb (89.4 kg)   SpO2 98%   BMI 29.95 kg/m   HENT:  Head: Normocephalic and atraumatic.  Right Ear: Hearing normal.  Left Ear: Hearing normal.  Eyes: Conjunctivae are normal. No scleral icterus.  Neck: Normal range of motion. Neck supple. No thyromegaly present.  Cardiovascular: Normal rate, regular rhythm and normal heart  sounds.  Pulses:      Radial pulses are 2+ on the right side, and 2+ on the left side.  Pulmonary/Chest: Effort normal and breath sounds normal.  Lymphadenopathy:       Head (right side): No tonsillar, no preauricular, no posterior auricular and no occipital adenopathy present.       Head (left side): No tonsillar, no preauricular, no posterior auricular and no occipital adenopathy present.    He has no cervical adenopathy.       Right: No supraclavicular adenopathy present.       Left: No supraclavicular adenopathy present.  Neurological: He is alert and oriented to person, place, and time. No sensory deficit.  Skin: Skin is warm, dry and intact. No rash noted. No cyanosis or erythema. Nails show no clubbing.  Psychiatric: He has a normal mood and affect. His speech is normal and behavior is normal.    Wt Readings from Last 3 Encounters:  10/31/17 197 lb (89.4 kg)  07/18/17 201 lb 6.4 oz (91.4 kg)  04/17/17 200 lb (90.7 kg)   Diabetic Foot Exam - Simple   Simple Foot Form Diabetic Foot exam was performed with the  following findings:  Yes 10/31/2017  8:53 AM  Visual Inspection No deformities, no ulcerations, no other skin breakdown bilaterally:  Yes Sensation Testing Intact to touch and monofilament testing bilaterally:  Yes Pulse Check Posterior Tibialis and Dorsalis pulse intact bilaterally:  Yes Comments        Assessment & Plan:   Problem List Items Addressed This Visit    Diabetes mellitus type 2, uncontrolled, with complications ([place]) - Primary    Stopped Trulicity 3 weeks ago due to cost. Continue metformin 500 mg TID, Januvia 100 mg daily, Farxiga 10 mg daily. Await lab results.      Essential hypertension, benign    Well controlled. Continue HCTZ 12.5 mg daily, losartan 100 mg daily.      Relevant Orders   CBC with Differential/Platelet   Comprehensive metabolic panel   Mixed hyperlipidemia    Await labs. Adjust regimen as indicated by results. Continue  fenofibrate 145 mg daily, Niaspan 500 mg daily, pravastatin 40 mg daily. Consider change pravastatin to atorvastatin or rosuvastatin if additional lipid lowering needed.      Relevant Orders   Comprehensive metabolic panel   Lipid panel   Anxiety    Well controlled on paroxetine. Continue.      BMI 30.0-30.9,adult    Congratulated on weight loss of 4 lbs since last visit. Encouraged increasing his exercise and continued efforts for healthier eating.      Nonalcoholic steatohepatitis (NASH)    Continue control of diabetes and hyperlipidemia. Await labs. Adjust regimen as indicated by results.      Relevant Orders   Comprehensive metabolic panel       Return in about 3 months (around 01/16/2018) for re-evalaution of diabetes, blood pressure and cholesterol.   Fara Chute, PA-C [facility]

## 2017-10-31 NOTE — Assessment & Plan Note (Signed)
Await labs. Adjust regimen as indicated by results. Continue fenofibrate 145 mg daily, Niaspan 500 mg daily, pravastatin 40 mg daily. Consider change pravastatin to atorvastatin or rosuvastatin if additional lipid lowering needed.

## 2017-10-31 NOTE — Assessment & Plan Note (Signed)
Well controlled. Continue HCTZ 12.5 mg daily, losartan 100 mg daily.

## 2017-10-31 NOTE — Patient Instructions (Addendum)
Goal: get at least 150 minutes of exercise each week. Start where you are, and increase as you are able. Maybe you start with 15 minutes 3 times a week. Then increase to 20 minutes, then 30, and so on. Make healthy eating choices: lots of vegetables, lean proteins. Go easy on the grains, and when you do have them, get WHOLE grains. The increased fiber helps to keep you glucose and choelsterol down.     IF you received an x-ray today, you will receive an invoice from [facility]. Please contact [facility] at [phone] with questions or concerns regarding your invoice.   IF you received labwork today, you will receive an invoice from [facility]. Please contact [facility] at [phone] with questions or concerns regarding your invoice.   Our billing staff will not be able to assist you with questions regarding bills from these companies.  You will be contacted with the lab results as soon as they are available. The fastest way to get your results is to activate your My Chart account. Instructions are located on the last page of this paperwork. If you have not heard from [place] regarding the results in 2 weeks, please contact this office.

## 2017-10-31 NOTE — Assessment & Plan Note (Signed)
Stopped Trulicity 3 weeks ago due to cost. Continue metformin 500 mg TID, Januvia 100 mg daily, Farxiga 10 mg daily. Await lab results.

## 2017-10-31 NOTE — Assessment & Plan Note (Signed)
Well controlled on paroxetine. Continue.

## 2017-10-31 NOTE — Assessment & Plan Note (Signed)
Congratulated on weight loss of 4 lbs since last visit. Encouraged increasing his exercise and continued efforts for healthier eating.

## 2017-10-31 NOTE — Assessment & Plan Note (Signed)
Continue control of diabetes and hyperlipidemia. Await labs. Adjust regimen as indicated by results.

## 2017-11-01 LAB — CBC WITH DIFFERENTIAL/PLATELET
Basophils Absolute: 0 10*3/uL (ref 0.0–0.2)
Basos: 1 %
EOS (ABSOLUTE): 0.1 10*3/uL (ref 0.0–0.4)
Eos: 2 %
Hematocrit: 45.5 % (ref 37.5–51.0)
Hemoglobin: 15.3 g/dL (ref 13.0–17.7)
IMMATURE GRANULOCYTES: 0 %
Immature Grans (Abs): 0 10*3/uL (ref 0.0–0.1)
LYMPHS ABS: 2.6 10*3/uL (ref 0.7–3.1)
Lymphs: 49 %
MCH: 29.1 pg (ref 26.6–33.0)
MCHC: 33.6 g/dL (ref 31.5–35.7)
MCV: 87 fL (ref 79–97)
MONOS ABS: 0.6 10*3/uL (ref 0.1–0.9)
Monocytes: 12 %
NEUTROS PCT: 36 %
Neutrophils Absolute: 1.9 10*3/uL (ref 1.4–7.0)
PLATELETS: 270 10*3/uL (ref 150–379)
RBC: 5.26 x10E6/uL (ref 4.14–5.80)
RDW: 14 % (ref 12.3–15.4)
WBC: 5.3 10*3/uL (ref 3.4–10.8)

## 2017-11-01 LAB — LIPID PANEL
CHOLESTEROL TOTAL: 124 mg/dL (ref 100–199)
Chol/HDL Ratio: 5.2 ratio — ABNORMAL HIGH (ref 0.0–5.0)
HDL: 24 mg/dL — ABNORMAL LOW (ref >39)
LDL Calculated: 70 mg/dL (ref 0–99)
Triglycerides: 152 mg/dL — ABNORMAL HIGH (ref 0–149)
VLDL CHOLESTEROL CAL: 30 mg/dL (ref 5–40)

## 2017-11-01 LAB — COMPREHENSIVE METABOLIC PANEL
A/G RATIO: 2 (ref 1.2–2.2)
ALK PHOS: 48 IU/L (ref 39–117)
ALT: 42 IU/L (ref 0–44)
AST: 22 IU/L (ref 0–40)
Albumin: 4.4 g/dL (ref 3.5–5.5)
BUN/Creatinine Ratio: 13 (ref 9–20)
BUN: 14 mg/dL (ref 6–24)
Bilirubin Total: 0.3 mg/dL (ref 0.0–1.2)
CALCIUM: 9.8 mg/dL (ref 8.7–10.2)
CO2: 20 mmol/L (ref 20–29)
CREATININE: 1.06 mg/dL (ref 0.76–1.27)
Chloride: 100 mmol/L (ref 96–106)
GFR calc Af Amer: 99 mL/min/{1.73_m2} (ref >59)
GFR, EST NON AFRICAN AMERICAN: 86 mL/min/{1.73_m2} (ref >59)
GLOBULIN, TOTAL: 2.2 g/dL (ref 1.5–4.5)
Glucose: 213 mg/dL — ABNORMAL HIGH (ref 65–99)
POTASSIUM: 3.7 mmol/L (ref 3.5–5.2)
SODIUM: 138 mmol/L (ref 134–144)
Total Protein: 6.6 g/dL (ref 6.0–8.5)

## 2017-11-01 LAB — HEMOGLOBIN A1C
Est. average glucose Bld gHb Est-mCnc: 154 mg/dL
HEMOGLOBIN A1C: 7 % — AB (ref 4.8–5.6)

## 2017-11-06 ENCOUNTER — Telehealth: Payer: Self-pay | Admitting: *Deleted

## 2017-11-06 NOTE — Telephone Encounter (Signed)
Denial letter put in box for Trulicity

## 2017-11-07 NOTE — Telephone Encounter (Signed)
This is a duplicate.

## 2017-11-08 ENCOUNTER — Encounter: Payer: Self-pay | Admitting: Physician Assistant

## 2017-11-12 ENCOUNTER — Other Ambulatory Visit: Payer: Self-pay | Admitting: Physician Assistant

## 2017-11-12 DIAGNOSIS — IMO0002 Reserved for concepts with insufficient information to code with codable children: Secondary | ICD-10-CM

## 2017-11-12 DIAGNOSIS — E1165 Type 2 diabetes mellitus with hyperglycemia: Secondary | ICD-10-CM

## 2017-11-12 DIAGNOSIS — E118 Type 2 diabetes mellitus with unspecified complications: Principal | ICD-10-CM

## 2017-11-12 NOTE — Telephone Encounter (Signed)
Brad Pineda,   This is from last visit, does patient still need glipizide?  Stopped Trulicity 3 weeks ago due to cost. Continue metformin 500 mg TID, Januvia 100 mg daily, Farxiga 10 mg daily. Await lab results.

## 2017-11-13 NOTE — Telephone Encounter (Signed)
Yes, continue glipizide  Meds ordered this encounter  Medications  . glipiZIDE (GLUCOTROL XL) 10 MG 24 hr tablet    Sig: TAKE 1 TABLET (10 MG TOTAL) BY MOUTH DAILY WITH BREAKFAST.    Dispense:  90 tablet    Refill:  3

## 2018-01-03 ENCOUNTER — Other Ambulatory Visit: Payer: Self-pay | Admitting: Physician Assistant

## 2018-01-03 DIAGNOSIS — E118 Type 2 diabetes mellitus with unspecified complications: Principal | ICD-10-CM

## 2018-01-03 DIAGNOSIS — E1165 Type 2 diabetes mellitus with hyperglycemia: Secondary | ICD-10-CM

## 2018-01-03 DIAGNOSIS — IMO0002 Reserved for concepts with insufficient information to code with codable children: Secondary | ICD-10-CM

## 2018-01-16 ENCOUNTER — Other Ambulatory Visit: Payer: Self-pay

## 2018-01-16 ENCOUNTER — Ambulatory Visit: Payer: Commercial Managed Care - PPO | Admitting: Physician Assistant

## 2018-01-16 ENCOUNTER — Encounter: Payer: Self-pay | Admitting: Physician Assistant

## 2018-01-16 VITALS — BP 128/70 | HR 98 | Temp 97.6°F | Ht 68.0 in | Wt 195.4 lb

## 2018-01-16 DIAGNOSIS — E782 Mixed hyperlipidemia: Secondary | ICD-10-CM

## 2018-01-16 DIAGNOSIS — E118 Type 2 diabetes mellitus with unspecified complications: Secondary | ICD-10-CM | POA: Diagnosis not present

## 2018-01-16 DIAGNOSIS — I10 Essential (primary) hypertension: Secondary | ICD-10-CM | POA: Diagnosis not present

## 2018-01-16 DIAGNOSIS — IMO0002 Reserved for concepts with insufficient information to code with codable children: Secondary | ICD-10-CM

## 2018-01-16 DIAGNOSIS — Z6830 Body mass index (BMI) 30.0-30.9, adult: Secondary | ICD-10-CM | POA: Diagnosis not present

## 2018-01-16 DIAGNOSIS — F419 Anxiety disorder, unspecified: Secondary | ICD-10-CM | POA: Diagnosis not present

## 2018-01-16 DIAGNOSIS — E1165 Type 2 diabetes mellitus with hyperglycemia: Secondary | ICD-10-CM

## 2018-01-16 LAB — CBC WITH DIFFERENTIAL/PLATELET
BASOS: 1 %
Basophils Absolute: 0 10*3/uL (ref 0.0–0.2)
EOS (ABSOLUTE): 0.1 10*3/uL (ref 0.0–0.4)
Eos: 2 %
HEMATOCRIT: 44.9 % (ref 37.5–51.0)
Hemoglobin: 15.2 g/dL (ref 13.0–17.7)
Immature Grans (Abs): 0 10*3/uL (ref 0.0–0.1)
Immature Granulocytes: 0 %
LYMPHS ABS: 2.8 10*3/uL (ref 0.7–3.1)
Lymphs: 52 %
MCH: 28.4 pg (ref 26.6–33.0)
MCHC: 33.9 g/dL (ref 31.5–35.7)
MCV: 84 fL (ref 79–97)
MONOS ABS: 0.5 10*3/uL (ref 0.1–0.9)
Monocytes: 10 %
Neutrophils Absolute: 1.8 10*3/uL (ref 1.4–7.0)
Neutrophils: 35 %
Platelets: 286 10*3/uL (ref 150–450)
RBC: 5.35 x10E6/uL (ref 4.14–5.80)
RDW: 14.2 % (ref 12.3–15.4)
WBC: 5.3 10*3/uL (ref 3.4–10.8)

## 2018-01-16 LAB — HEMOGLOBIN A1C
ESTIMATED AVERAGE GLUCOSE: 171 mg/dL
Hgb A1c MFr Bld: 7.6 % — ABNORMAL HIGH (ref 4.8–5.6)

## 2018-01-16 LAB — LIPID PANEL
CHOLESTEROL TOTAL: 129 mg/dL (ref 100–199)
Chol/HDL Ratio: 5.4 ratio — ABNORMAL HIGH (ref 0.0–5.0)
HDL: 24 mg/dL — ABNORMAL LOW (ref >39)
LDL Calculated: 73 mg/dL (ref 0–99)
TRIGLYCERIDES: 159 mg/dL — AB (ref 0–149)
VLDL CHOLESTEROL CAL: 32 mg/dL (ref 5–40)

## 2018-01-16 LAB — COMPREHENSIVE METABOLIC PANEL
A/G RATIO: 2.2 (ref 1.2–2.2)
ALT: 48 IU/L — AB (ref 0–44)
AST: 26 IU/L (ref 0–40)
Albumin: 4.6 g/dL (ref 3.5–5.5)
Alkaline Phosphatase: 48 IU/L (ref 39–117)
BUN/Creatinine Ratio: 15 (ref 9–20)
BUN: 15 mg/dL (ref 6–24)
Bilirubin Total: 0.6 mg/dL (ref 0.0–1.2)
CALCIUM: 9.4 mg/dL (ref 8.7–10.2)
CO2: 25 mmol/L (ref 20–29)
Chloride: 102 mmol/L (ref 96–106)
Creatinine, Ser: 1.03 mg/dL (ref 0.76–1.27)
GFR, EST AFRICAN AMERICAN: 102 mL/min/{1.73_m2} (ref >59)
GFR, EST NON AFRICAN AMERICAN: 89 mL/min/{1.73_m2} (ref >59)
GLUCOSE: 122 mg/dL — AB (ref 65–99)
Globulin, Total: 2.1 g/dL (ref 1.5–4.5)
POTASSIUM: 3.8 mmol/L (ref 3.5–5.2)
Sodium: 141 mmol/L (ref 134–144)
TOTAL PROTEIN: 6.7 g/dL (ref 6.0–8.5)

## 2018-01-16 MED ORDER — DAPAGLIFLOZIN PROPANEDIOL 10 MG PO TABS: 10.0000 mg | ORAL_TABLET | Freq: Every day | ORAL | 3 refills | Status: DC

## 2018-01-16 MED ORDER — SITAGLIPTIN PHOSPHATE 100 MG PO TABS: 100.0000 mg | ORAL_TABLET | Freq: Every day | ORAL | 1 refills | Status: DC

## 2018-01-16 MED ORDER — PAROXETINE HCL 40 MG PO TABS: ORAL_TABLET | ORAL | 3 refills | Status: AC

## 2018-01-16 MED ORDER — FENOFIBRATE 145 MG PO TABS: ORAL_TABLET | ORAL | 3 refills | Status: DC

## 2018-01-16 MED ORDER — METFORMIN HCL 500 MG PO TABS: 500.0000 mg | ORAL_TABLET | Freq: Three times a day (TID) | ORAL | 1 refills | Status: DC

## 2018-01-16 NOTE — Patient Instructions (Addendum)
Check your glucose sometimes fasting (goal <140) and sometimes 2-3 hours after your largest meal of the day (goal <180). Keep working on healthy eating and regular exercise.  Go ahead and call [facility] to schedule your next visit with me there. [phone].   IF you received an x-ray today, you will receive an invoice from [facility]. Please contact [facility] Radiology at [phone] with questions or concerns regarding your invoice.   IF you received labwork today, you will receive an invoice from [place]. Please contact [facility] at [phone] with questions or concerns regarding your invoice.   Our billing staff will not be able to assist you with questions regarding bills from these companies.  You will be contacted with the lab results as soon as they are available. The fastest way to get your results is to activate your My Chart account. Instructions are located on the last page of this paperwork. If you have not heard from [place] regarding the results in 2 weeks, please contact this office.

## 2018-01-16 NOTE — Progress Notes (Signed)
Patient ID: Brad Ahlers., male    DOB: [birth year], 42 y.o.   MRN: [id number]  PCP: Harrison Mons, PA-C  Chief Complaint  Patient presents with  . Chronic Conditions    3 month follow up     Subjective:   Presents for evaluation of diabetes, hypertension, hyperlipidemia.  Refill request for dapagliflozin 01/03/2018 was denied by this office, but he has not run out.  It is not clear why.  He is tolerating his medications without any difficulty. No chest pain, shortness of breath, blurred vision, dizziness. No polydipsia or polyuria.  Home glucose log reviewed. 127-213, most readings are mid-100's. Checks sometimes fasting, sometimes post-prandial. Last labs 10/2017. A1C 7% LDL 70   Review of Systems  Constitutional: Negative for activity change, appetite change, fatigue and unexpected weight change.  HENT: Negative for congestion, dental problem, ear pain, hearing loss, mouth sores, postnasal drip, rhinorrhea, sneezing, sore throat, tinnitus and trouble swallowing.   Eyes: Negative for photophobia, pain, redness and visual disturbance.  Respiratory: Negative for cough, chest tightness and shortness of breath.   Cardiovascular: Negative for chest pain, palpitations and leg swelling.  Gastrointestinal: Negative for abdominal pain, blood in stool, constipation, diarrhea, nausea and vomiting.  Endocrine: Negative for cold intolerance, heat intolerance, polydipsia, polyphagia and polyuria.  Genitourinary: Negative for dysuria, frequency, hematuria and urgency.  Musculoskeletal: Negative for arthralgias, gait problem, myalgias and neck stiffness.  Skin: Negative for rash.  Neurological: Negative for dizziness, speech difficulty, weakness, light-headedness, numbness and headaches.  Hematological: Negative for adenopathy.  Psychiatric/Behavioral: Negative for confusion and sleep disturbance. The patient is not nervous/anxious.        Patient Active Problem List   Diagnosis Date Noted  . Glaucoma 08/24/2015  . Nonalcoholic steatohepatitis (NASH) 08/24/2015  . OSA on CPAP 12/10/2013  . BMI 30.0-30.9,adult 05/13/2013  . Diabetes mellitus type 2, uncontrolled, with complications ([place])   . Essential hypertension, benign   . Mixed hyperlipidemia   . GERD (gastroesophageal reflux disease)   . Anxiety      Prior to Admission medications   Medication Sig Start Date End Date Taking? Authorizing Provider  ammonium lactate (LAC-HYDRIN) 12 % lotion Apply 1 application topically 2 (two) times daily 07/06/15  Yes Michaell Grider, PA-C  aspirin 81 MG tablet Take 162 mg by mouth once. Takes 3 qd   Yes [provider]  cetirizine (ZYRTEC) 10 MG tablet Take 10 mg by mouth daily.   Yes [provider]  dapagliflozin propanediol (FARXIGA) 10 MG TABS tablet Take 10 mg by mouth daily. 01/12/17  Yes Shadae Reino, PA-C  fenofibrate (TRICOR) 145 MG tablet TAKE 1 TABLET (145 MG TOTAL) BY MOUTH DAILY. 01/02/17  Yes Obaloluwa Delatte, PA-C  fish oil-omega-3 fatty acids 1000 MG capsule Take 1 g by mouth daily.    Yes [provider]  fluticasone (FLONASE) 50 MCG/ACT nasal spray PLACE 2 SPRAYS INTO BOTH NOSTRILS DAILY. 07/19/17  Yes Kalvin Buss, PA-C  glipiZIDE (GLUCOTROL XL) 10 MG 24 hr tablet TAKE 1 TABLET (10 MG TOTAL) BY MOUTH DAILY WITH BREAKFAST. 11/13/17  Yes Alexis Reber, PA-C  hydrochlorothiazide (HYDRODIURIL) 12.5 MG tablet Take 1 tablet (12.5 mg total) by mouth daily. 01/02/17  Yes Izac Faulkenberry, PA-C  ibuprofen (ADVIL,MOTRIN) 200 MG tablet Take 200 mg by mouth every 6 (six) hours as needed.   Yes [provider]  ipratropium (ATROVENT) 0.03 % nasal spray Place 2 sprays into both nostrils every 12 (twelve) hours. 07/18/17  Yes Jacqulynn Cadet,  Rhodie Cienfuegos, PA-C  JANUVIA 100 MG tablet TAKE 1 TABLET BY MOUTH EVERY DAY 10/26/17  Yes Linette Gunderson, PA-C  lansoprazole (PREVACID) 15 MG capsule Take 15 mg by mouth daily.   Yes [provider]  losartan (COZAAR) 100 MG tablet TAKE 1 TABLET BY MOUTH EVERY DAY 10/26/17  Yes Coston Mandato, PA-C  metFORMIN (GLUCOPHAGE) 500 MG tablet TAKE 1 TABLET (500 MG TOTAL) BY MOUTH 3 (THREE) TIMES DAILY. 01/23/17  Yes Everlie Eble, PA-C  niacin (NIASPAN) 500 MG CR tablet TAKE 1 TABLET BY MOUTH EVERY DAY 10/26/17  Yes Evita Merida, PA-C  PARoxetine (PAXIL) 40 MG tablet TAKE 1 TABLET (40 MG TOTAL) BY MOUTH DAILY. 08/03/17  Yes Dequane Strahan, PA-C  pravastatin (PRAVACHOL) 40 MG tablet TAKE 1 TABLET BY MOUTH EVERY DAY 10/26/17  Yes Isabel Freese, PA-C      No Known Allergies     Objective:  Physical Exam  Constitutional: He is oriented to person, place, and time. He appears well-developed and well-nourished. He is active and cooperative. No distress.  BP 128/70 (BP Location: Left Arm, Patient Position: Sitting, Cuff Size: Normal)   Pulse 98   Temp 97.6 F (36.4 C) (Oral)   Ht 5' 8"  (1.727 m)   Wt 195 lb 6.4 oz (88.6 kg)   SpO2 96%   BMI 29.71 kg/m   HENT:  Head: Normocephalic and atraumatic.  Right Ear: Hearing normal.  Left Ear: Hearing normal.  Eyes: Conjunctivae are normal. No scleral icterus.  Neck: Normal range of motion. Neck supple. No thyromegaly present.  Cardiovascular: Normal rate, regular rhythm and normal heart sounds.  Pulses:      Radial pulses are 2+ on the right side, and 2+ on the left side.  Pulmonary/Chest: Effort normal and breath sounds normal.  Lymphadenopathy:       Head (right side): No tonsillar, no preauricular, no posterior auricular and no occipital adenopathy present.       Head (left side): No tonsillar, no preauricular, no posterior auricular and no occipital adenopathy present.    He has no cervical adenopathy.       Right: No supraclavicular adenopathy present.       Left: No supraclavicular adenopathy present.  Neurological: He is alert and oriented to person, place, and time. No sensory deficit.  Skin: Skin is warm, dry and  intact. No rash noted. No cyanosis or erythema. Nails show no clubbing.  Psychiatric: He has a normal mood and affect. His speech is normal and behavior is normal.    Wt Readings from Last 3 Encounters:  01/16/18 195 lb 6.4 oz (88.6 kg)  10/31/17 197 lb (89.4 kg)  07/18/17 201 lb 6.4 oz (91.4 kg)       Assessment & Plan:   Problem List Items Addressed This Visit    Mixed hyperlipidemia    Await lab results.  LDL has been at goal or better.  HDL is quite low.  Increase physical activity.  Healthier eating choices encouraged.  Continue fenofibrate, Niaspan and pravastatin.      Relevant Medications   fenofibrate (TRICOR) 145 MG tablet   Other Relevant Orders   Comprehensive metabolic panel (Completed)   Lipid panel (Completed)   Essential hypertension, benign    Controlled.  Continue losartan 100 mg daily.  Healthy lifestyle changes.      Relevant Medications   fenofibrate (TRICOR) 145 MG tablet   Other Relevant Orders   CBC with Differential/Platelet (Completed)   Comprehensive metabolic panel (Completed)  Diabetes mellitus type 2, uncontrolled, with complications ([place]) - Primary    Continue healthy lifestyle modifications.  Await today's lab results.  He had a slight rise in A1c at last visit.  He has not tolerated higher doses of metformin, if we need additional glucose lowering, will need to add another agent.  I would defer to endocrinology.      Relevant Medications   dapagliflozin propanediol (FARXIGA) 10 MG TABS tablet   sitaGLIPtin (JANUVIA) 100 MG tablet   metFORMIN (GLUCOPHAGE) 500 MG tablet   Other Relevant Orders   Comprehensive metabolic panel (Completed)   Hemoglobin A1c (Completed)   BMI 30.0-30.9,adult    He has lost a little weight.  Encouraged continued efforts for healthy lifestyle changes- healthier eating choices with lean proteins and fewer starches.  Increase physical activity to 150 minutes/week.      Anxiety    Well-controlled on paroxetine 40  mg daily.      Relevant Medications   PARoxetine (PAXIL) 40 MG tablet       Return in about 3 months (around 04/18/2018) for re-evaluation of diabetes, choelsterol, blood pressure.   Fara Chute, PA-C [facility] [place]

## 2018-01-17 NOTE — Assessment & Plan Note (Signed)
Controlled.  Continue losartan 100 mg daily.  Healthy lifestyle changes.

## 2018-01-17 NOTE — Assessment & Plan Note (Signed)
Well-controlled on paroxetine 40 mg daily.

## 2018-01-17 NOTE — Assessment & Plan Note (Signed)
Continue healthy lifestyle modifications.  Await today's lab results.  He had a slight rise in A1c at last visit.  He has not tolerated higher doses of metformin, if we need additional glucose lowering, will need to add another agent.  I would defer to endocrinology.

## 2018-01-17 NOTE — Assessment & Plan Note (Signed)
He has lost a little weight.  Encouraged continued efforts for healthy lifestyle changes- healthier eating choices with lean proteins and fewer starches.  Increase physical activity to 150 minutes/week.

## 2018-01-17 NOTE — Assessment & Plan Note (Signed)
Await lab results.  LDL has been at goal or better.  HDL is quite low.  Increase physical activity.  Healthier eating choices encouraged.  Continue fenofibrate, Niaspan and pravastatin.

## 2018-01-18 ENCOUNTER — Other Ambulatory Visit: Payer: Self-pay | Admitting: Physician Assistant

## 2018-01-18 DIAGNOSIS — J0191 Acute recurrent sinusitis, unspecified: Secondary | ICD-10-CM

## 2018-01-24 ENCOUNTER — Other Ambulatory Visit: Payer: Self-pay | Admitting: Physician Assistant

## 2018-01-24 DIAGNOSIS — I10 Essential (primary) hypertension: Secondary | ICD-10-CM

## 2018-01-24 NOTE — Telephone Encounter (Signed)
Hydrochlorothiazide refill Last OV:01/16/18 Last refill:01/02/17 90/3refill (expired prescription) SLP:[id number] Dr. Pamella Pert; [facility] [place]: [id number] [phone](Phone) [phone](Fax)

## 2019-10-31 ENCOUNTER — Ambulatory Visit: Payer: Managed Care, Other (non HMO) | Attending: Internal Medicine

## 2019-10-31 DIAGNOSIS — Z23 Encounter for immunization: Secondary | ICD-10-CM

## 2019-10-31 NOTE — Progress Notes (Signed)
   [facility]ination [facility]  Name:  Brad Pineda.    MRN: [id number] DOB: 14-Feb-1976  10/31/2019  Mr. Brad Pineda was observed post Covid-19 immunization for 15 minutes without incident. He was provided with Vaccine Information Sheet and instruction to access the V-Safe system.   Mr. Brad Pineda was instructed to call 911 with any severe reactions post vaccine: Marland Kitchen Difficulty breathing  . Swelling of face and throat  . A fast heartbeat  . A bad rash all over body  . Dizziness and weakness   Immunizations Administered    Name Date Dose VIS Date Route   Pfizer COVID-19 Vaccine 10/31/2019  8:26 AM 0.3 mL 07/18/2019 Intramuscular   Manufacturer: [place]   Lot: [id number]   [place]: [address]-0

## 2019-11-26 ENCOUNTER — Ambulatory Visit: Payer: Managed Care, Other (non HMO) | Attending: Internal Medicine

## 2019-11-26 DIAGNOSIS — Z23 Encounter for immunization: Secondary | ICD-10-CM

## 2019-11-26 NOTE — Progress Notes (Signed)
   [facility] Vaccination [facility]  Name:  Brad Pineda.    MRN: [id number] DOB: [birth year]  11/26/2019  Mr. Brad Pineda was observed post Covid-19 immunization for 15 minutes without incident. He was provided with Vaccine Information Sheet and instruction to access the V-Safe system.   Mr. Brad Pineda was instructed to call 911 with any severe reactions post vaccine: Marland Kitchen Difficulty breathing  . Swelling of face and throat  . A fast heartbeat  . A bad rash all over body  . Dizziness and weakness   Immunizations Administered    Name Date Dose VIS Date Route   Pfizer COVID-19 Vaccine 11/26/2019  3:36 PM 0.3 mL 10/01/2018 Intramuscular   Manufacturer: [place]   Lot: [id number]   [place]: [id number]

## 2020-03-22 ENCOUNTER — Encounter: Payer: Self-pay | Admitting: Gastroenterology

## 2020-05-20 ENCOUNTER — Encounter: Payer: Managed Care, Other (non HMO) | Admitting: Gastroenterology

## 2020-06-07 ENCOUNTER — Other Ambulatory Visit: Payer: Self-pay

## 2020-06-07 ENCOUNTER — Ambulatory Visit (AMBULATORY_SURGERY_CENTER): Payer: Self-pay | Admitting: *Deleted

## 2020-06-07 VITALS — Ht 68.0 in | Wt 193.0 lb

## 2020-06-07 DIAGNOSIS — Z1211 Encounter for screening for malignant neoplasm of colon: Secondary | ICD-10-CM

## 2020-06-07 MED ORDER — SUTAB 1479-225-188 MG PO TABS: 1.0000 | ORAL_TABLET | ORAL | 0 refills | Status: DC

## 2020-06-07 NOTE — Progress Notes (Signed)
Patient is here in-person for PV. Patient denies any allergies to eggs or soy. Patient denies any problems with anesthesia/sedation. Patient denies any oxygen use at home. Patient denies taking any diet/weight loss medications or blood thinners. Patient is not being treated for MRSA or C-diff. Patient is aware of our care-partner policy and [id number] safety protocol. EMMI education assisgned to the patient for the procedure, pt is aware.   COVID-19 vaccines completed on  06/03/20 booster, per patient.   Prep Prescription coupon given to the patient.

## 2020-06-09 ENCOUNTER — Encounter: Payer: Self-pay | Admitting: Gastroenterology

## 2020-06-22 ENCOUNTER — Other Ambulatory Visit: Payer: Self-pay

## 2020-06-22 ENCOUNTER — Encounter: Payer: Self-pay | Admitting: Gastroenterology

## 2020-06-22 ENCOUNTER — Ambulatory Visit (AMBULATORY_SURGERY_CENTER): Payer: Commercial Managed Care - PPO | Admitting: Gastroenterology

## 2020-06-22 ENCOUNTER — Other Ambulatory Visit (INDEPENDENT_AMBULATORY_CARE_PROVIDER_SITE_OTHER): Payer: Commercial Managed Care - PPO

## 2020-06-22 VITALS — BP 105/68 | HR 77 | Temp 98.4°F | Resp 11 | Ht 68.0 in | Wt 193.0 lb

## 2020-06-22 DIAGNOSIS — IMO0002 Reserved for concepts with insufficient information to code with codable children: Secondary | ICD-10-CM

## 2020-06-22 DIAGNOSIS — K219 Gastro-esophageal reflux disease without esophagitis: Secondary | ICD-10-CM | POA: Diagnosis not present

## 2020-06-22 DIAGNOSIS — E1165 Type 2 diabetes mellitus with hyperglycemia: Secondary | ICD-10-CM

## 2020-06-22 DIAGNOSIS — K623 Rectal prolapse: Secondary | ICD-10-CM | POA: Diagnosis not present

## 2020-06-22 DIAGNOSIS — K644 Residual hemorrhoidal skin tags: Secondary | ICD-10-CM | POA: Diagnosis not present

## 2020-06-22 DIAGNOSIS — K625 Hemorrhage of anus and rectum: Secondary | ICD-10-CM

## 2020-06-22 DIAGNOSIS — K6289 Other specified diseases of anus and rectum: Secondary | ICD-10-CM | POA: Diagnosis not present

## 2020-06-22 DIAGNOSIS — Z1211 Encounter for screening for malignant neoplasm of colon: Secondary | ICD-10-CM

## 2020-06-22 DIAGNOSIS — K7581 Nonalcoholic steatohepatitis (NASH): Secondary | ICD-10-CM | POA: Diagnosis not present

## 2020-06-22 DIAGNOSIS — D124 Benign neoplasm of descending colon: Secondary | ICD-10-CM

## 2020-06-22 DIAGNOSIS — E118 Type 2 diabetes mellitus with unspecified complications: Secondary | ICD-10-CM | POA: Diagnosis not present

## 2020-06-22 DIAGNOSIS — D122 Benign neoplasm of ascending colon: Secondary | ICD-10-CM

## 2020-06-22 LAB — COMPREHENSIVE METABOLIC PANEL
ALT: 40 U/L (ref 0–53)
AST: 28 U/L (ref 0–37)
Albumin: 4.4 g/dL (ref 3.5–5.2)
Alkaline Phosphatase: 39 U/L (ref 39–117)
BUN: 8 mg/dL (ref 6–23)
CO2: 30 mEq/L (ref 19–32)
Calcium: 8.3 mg/dL — ABNORMAL LOW (ref 8.4–10.5)
Chloride: 100 mEq/L (ref 96–112)
Creatinine, Ser: 0.88 mg/dL (ref 0.40–1.50)
GFR: 103.24 mL/min (ref >60.00)
Glucose, Bld: 97 mg/dL (ref 70–99)
Potassium: 3.2 mEq/L — ABNORMAL LOW (ref 3.5–5.1)
Sodium: 138 mEq/L (ref 135–145)
Total Bilirubin: 0.5 mg/dL (ref 0.2–1.2)
Total Protein: 6.8 g/dL (ref 6.0–8.3)

## 2020-06-22 LAB — CBC WITH DIFFERENTIAL/PLATELET
Basophils Absolute: 0 10*3/uL (ref 0.0–0.1)
Basophils Relative: 0.8 % (ref 0.0–3.0)
Eosinophils Absolute: 0.1 10*3/uL (ref 0.0–0.7)
Eosinophils Relative: 1.3 % (ref 0.0–5.0)
HCT: 42.9 % (ref 39.0–52.0)
Hemoglobin: 14.6 g/dL (ref 13.0–17.0)
Lymphocytes Relative: 48.1 % — ABNORMAL HIGH (ref 12.0–46.0)
Lymphs Abs: 2.8 10*3/uL (ref 0.7–4.0)
MCHC: 34 g/dL (ref 30.0–36.0)
MCV: 82.4 fl (ref 78.0–100.0)
Monocytes Absolute: 0.4 10*3/uL (ref 0.1–1.0)
Monocytes Relative: 7.4 % (ref 3.0–12.0)
Neutro Abs: 2.5 10*3/uL (ref 1.4–7.7)
Neutrophils Relative %: 42.4 % — ABNORMAL LOW (ref 43.0–77.0)
Platelets: 284 10*3/uL (ref 150.0–400.0)
RBC: 5.21 Mil/uL (ref 4.22–5.81)
RDW: 13.7 % (ref 11.5–15.5)
WBC: 5.8 10*3/uL (ref 4.0–10.5)

## 2020-06-22 LAB — SEDIMENTATION RATE: Sed Rate: 4 mm/hr (ref 0–15)

## 2020-06-22 LAB — C-REACTIVE PROTEIN: CRP: <1 mg/dL (ref 0.5–20.0)

## 2020-06-22 MED ORDER — SODIUM CHLORIDE 0.9 % IV SOLN: 500.0000 mL | Freq: Once | INTRAVENOUS | Status: DC

## 2020-06-22 NOTE — Progress Notes (Signed)
To PACU, VSS. Report to [email or web address] 

## 2020-06-22 NOTE — Patient Instructions (Addendum)
Thank you for allowing [place] to care for you today!  Await biopsy results, approximately 7-10 days by mail.  Will make recommendation at that time for next colonoscopy.  Resume previous diet and medications today.  Add high fiber foods to your diet.    Start FiberCon 1-2 tablets daily with plenty of water for colon health.  Return to your normal activities tomorrow.   YOU HAD AN ENDOSCOPIC PROCEDURE TODAY AT [facility]:   Refer to the procedure report that was given to you for any specific questions about what was found during the examination.  If the procedure report does not answer your questions, please call your gastroenterologist to clarify.  If you requested that your care partner not be given the details of your procedure findings, then the procedure report has been included in a sealed envelope for you to review at your convenience later.  YOU SHOULD EXPECT: Some feelings of bloating in the abdomen. Passage of more gas than usual.  Walking can help get rid of the air that was put into your GI tract during the procedure and reduce the bloating. If you had a lower endoscopy (such as a colonoscopy or flexible sigmoidoscopy) you may notice spotting of blood in your stool or on the toilet paper. If you underwent a bowel prep for your procedure, you may not have a normal bowel movement for a few days.  Please Note:  You might notice some irritation and congestion in your nose or some drainage.  This is from the oxygen used during your procedure.  There is no need for concern and it should clear up in a day or so.  SYMPTOMS TO REPORT IMMEDIATELY:   Following lower endoscopy (colonoscopy or flexible sigmoidoscopy):  Excessive amounts of blood in the stool  Significant tenderness or worsening of abdominal pains  Swelling of the abdomen that is new, acute  Fever of 100F or higher  For urgent or emergent issues, a gastroenterologist can be reached at any hour by calling [phone]  [phone]. Do not use MyChart messaging for urgent concerns.    DIET:  We do recommend a small meal at first, but then you may proceed to your regular diet.  Drink plenty of fluids but you should avoid alcoholic beverages for 24 hours.  ACTIVITY:  You should plan to take it easy for the rest of today and you should NOT DRIVE or use heavy machinery until tomorrow (because of the sedation medicines used during the test).    FOLLOW UP: Our staff will call the number listed on your records 48-72 hours following your procedure to check on you and address any questions or concerns that you may have regarding the information given to you following your procedure. If we do not reach you, we will leave a message.  We will attempt to reach you two times.  During this call, we will ask if you have developed any symptoms of COVID 19. If you develop any symptoms (ie: fever, flu-like symptoms, shortness of breath, cough etc.) before then, please call [phone].  If you test positive for Covid 19 in the 2 weeks post procedure, please call and report this information to [place].    If any biopsies were taken you will be contacted by phone or by letter within the next 1-3 weeks.  Please call us at [phone] if you have not heard about the biopsies in 3 weeks.    SIGNATURES/CONFIDENTIALITY: You and/or your care partner have signed  paperwork which will be entered into your electronic medical record.  These signatures attest to the fact that that the information above on your After Visit Summary has been reviewed and is understood.  Full responsibility of the confidentiality of this discharge information lies with you and/or your care-partner.

## 2020-06-22 NOTE — Op Note (Addendum)
[facility] Patient Name: Brad Pineda Procedure Date: 06/22/2020 9:54 AM MRN: [id number] Endoscopist: Justice Britain , MD Age: 44 Referring MD:  Date of Birth: 1976/03/14 Gender: Male Account #: [id number] Procedure:                Colonoscopy Indications:              Screening for colorectal malignant neoplasm, This                            is the patient's first colonoscopy, Incidental                            Rectal bleeding Medicines:                Monitored Anesthesia Care Procedure:                Pre-Anesthesia Assessment:                           - Prior to the procedure, a History and Physical                            was performed, and patient medications and                            allergies were reviewed. The patient's tolerance of                            previous anesthesia was also reviewed. The risks                            and benefits of the procedure and the sedation                            options and risks were discussed with the patient.                            All questions were answered, and informed consent                            was obtained. Prior Anticoagulants: The patient has                            taken no previous anticoagulant or antiplatelet                            agents. ASA Grade Assessment: III - A patient with                            severe systemic disease. After reviewing the risks                            and benefits, the patient was deemed in  satisfactory condition to undergo the procedure.                           After obtaining informed consent, the colonoscope                            was passed under direct vision. Throughout the                            procedure, the patient's blood pressure, pulse, and                            oxygen saturations were monitored continuously. The                            Colonoscope was introduced through the  anus and                            advanced to the 5 cm into the ileum. The                            colonoscopy was performed without difficulty. The                            patient tolerated the procedure. The quality of the                            bowel preparation was adequate. The terminal ileum,                            ileocecal valve, appendiceal orifice, and rectum                            were photographed. Scope In: 10:16:50 AM Scope Out: 10:41:39 AM Scope Withdrawal Time: 0 hours 21 minutes 9 seconds  Total Procedure Duration: 0 hours 24 minutes 49 seconds  Findings:                 Skin tags were found on perianal exam.                           The digital rectal exam findings include                            hemorrhoids. Pertinent negatives include no                            palpable rectal lesions.                           The terminal ileum and ileocecal valve appeared                            normal.  Normal mucosa was found in the recto-sigmoid colon,                            in the sigmoid colon, in the descending colon, at                            the splenic flexure, in the transverse colon, at                            the hepatic flexure, in the ascending colon and in                            the cecum.                           Inflammation characterized by altered vascularity,                            congestion (edema), erosions, erythema, granularity                            and pseudopolyps was found in a continuous and                            circumferential pattern from the anus to the                            rectum. This was graded as Mayo Score 2 (moderate,                            with marked erythema, absent vascular pattern,                            friability, erosions). Biopsies were taken with a                            cold forceps for histology to rule out chronic                             proctitis.                           Seven sessile and semi-sessile polyps were found in                            the descending colon (3) and ascending colon (4).                            The polyps were 3 to 15 mm in size. These polyps                            were removed with a cold snare. Resection and  retrieval were complete.                           A few small-mouthed diverticula were found in the                            recto-sigmoid colon and sigmoid colon.                           Multiple diminutive white nodules were found at the                            dentate line. Biopsies were taken with a cold                            forceps for histology.                           Non-bleeding non-thrombosed external and internal                            hemorrhoids were found during retroflexion, during                            perianal exam and during digital exam. The                            hemorrhoids were Grade III (internal hemorrhoids                            that prolapse but require manual reduction). Complications:            No immediate complications. Estimated Blood Loss:     Estimated blood loss was minimal. Impression:               - Perianal skin tags found on perianal exam.                            Hemorrhoids found on digital rectal exam.                           - The examined portion of the ileum was normal.                           - Normal mucosa in the recto-sigmoid colon, in the                            sigmoid colon, in the descending colon, at the                            splenic flexure, in the transverse colon, at the                            hepatic flexure, in the ascending colon and in the  cecum.                           - Rule out Proctitis. Inflammation was found from                            the anus to the rectum. This was graded as Mayo                             Score 2 (moderate disease). Biopsied.                           - Seven 3 to 15 mm polyps in the descending colon                            and in the ascending colon, removed with a cold                            snare. Resected and retrieved.                           - Diverticulosis in the recto-sigmoid colon and in                            the sigmoid colon.                           - Nodules at dentate line. Biopsied.                           - Non-bleeding non-thrombosed external and internal                            hemorrhoids. Recommendation:           - The patient will be observed post-procedure,                            until all discharge criteria are met.                           - Discharge patient to home.                           - Patient has a contact number available for                            emergencies. The signs and symptoms of potential                            delayed complications were discussed with the                            patient. Return to normal activities tomorrow.  Written discharge instructions were provided to the                            patient.                           - High fiber diet.                           - Use FiberCon 1-2 tablets PO daily.                           - Continue present medications.                           - Would obtain the following labs in regards to                            potential therapies that may be considered in the                            future: CBC/CMP/ESR/CRP.                           - Await pathology results.                           - Repeat colonoscopy in 3 years for surveillance                            based on pathology results if only based on                            pathology of polyps most likely but will be                            determined in patient has evidence of proctitis if                             something sooner would be required based on need                            for mucosal healing evaluation.                           - The findings and recommendations were discussed                            with the patient.                           - The findings and recommendations were discussed                            with the patient's family. Justice Britain, MD 06/22/2020 11:03:24 AM

## 2020-06-22 NOTE — Progress Notes (Signed)
Called to room to assist during endoscopic procedure.  Patient ID and intended procedure confirmed with present staff. Received instructions for my participation in the procedure from the performing physician.  

## 2020-06-23 ENCOUNTER — Other Ambulatory Visit: Payer: Self-pay

## 2020-06-23 MED ORDER — POTASSIUM CHLORIDE ER 20 MEQ PO TBCR: 10.0000 meq | EXTENDED_RELEASE_TABLET | Freq: Every day | ORAL | 0 refills | Status: DC

## 2020-06-24 ENCOUNTER — Telehealth: Payer: Self-pay

## 2020-06-24 NOTE — Telephone Encounter (Signed)
Covid-19 screening questions   Do you now or have you had a fever in the last 14 days? No. Do you have any respiratory symptoms of shortness of breath or cough now or in the last 14 days? No.  Do you have any family members or close contacts with diagnosed or suspected Covid-19 in the past 14 days? No.  Have you been tested for Covid-19 and found to be positive? No.       Follow up Call-  Call back number 06/22/2020  Post procedure Call Back phone  # [phone]  Permission to leave phone message Yes  Some recent data might be hidden     Patient questions:  Do you have a fever, pain , or abdominal swelling? No. Pain Score  0 *  Have you tolerated food without any problems? Yes.    Have you been able to return to your normal activities? Yes.    Do you have any questions about your discharge instructions: Diet   No. Medications  No. Follow up visit  No.  Do you have questions or concerns about your Care? No.  Actions: * If pain score is 4 or above: No action needed, pain <4.

## 2020-06-27 ENCOUNTER — Encounter: Payer: Self-pay | Admitting: Gastroenterology

## 2020-06-28 ENCOUNTER — Other Ambulatory Visit: Payer: Self-pay

## 2020-06-28 ENCOUNTER — Telehealth: Payer: Self-pay | Admitting: Gastroenterology

## 2020-06-28 MED ORDER — POTASSIUM CHLORIDE ER 20 MEQ PO TBCR: 20.0000 meq | EXTENDED_RELEASE_TABLET | Freq: Every day | ORAL | 0 refills | Status: DC

## 2020-06-28 MED ORDER — HYDROCORTISONE ACETATE 25 MG RE SUPP: 25.0000 mg | Freq: Every day | RECTAL | 0 refills | Status: DC

## 2020-06-28 NOTE — Telephone Encounter (Signed)
The prescription for potassium has been corrected to 20 meq daily for 7 days. The pharmacy has been advised and new script sent.

## 2020-07-05 ENCOUNTER — Telehealth: Payer: Self-pay | Admitting: Gastroenterology

## 2020-07-05 NOTE — Telephone Encounter (Signed)
Pt is requesting a call back from a nurse to discuss which Vitamin D he needs to take and Vitamin C

## 2020-07-05 NOTE — Telephone Encounter (Signed)
The pt has been advised that he needs to take VIT D and Calcium OTC.  He thanked me for calling.

## 2020-09-01 ENCOUNTER — Ambulatory Visit: Payer: Commercial Managed Care - PPO | Admitting: Gastroenterology

## 2020-09-01 ENCOUNTER — Encounter: Payer: Self-pay | Admitting: Gastroenterology

## 2020-09-01 VITALS — BP 120/68 | HR 92 | Ht 68.0 in | Wt 195.8 lb

## 2020-09-01 DIAGNOSIS — K649 Unspecified hemorrhoids: Secondary | ICD-10-CM | POA: Diagnosis not present

## 2020-09-01 DIAGNOSIS — K219 Gastro-esophageal reflux disease without esophagitis: Secondary | ICD-10-CM | POA: Diagnosis not present

## 2020-09-01 DIAGNOSIS — K625 Hemorrhage of anus and rectum: Secondary | ICD-10-CM

## 2020-09-01 DIAGNOSIS — K623 Rectal prolapse: Secondary | ICD-10-CM

## 2020-09-01 NOTE — Patient Instructions (Signed)
A high fiber diet with plenty of fluids (up to 8 glasses of water daily) is suggested to relieve these symptoms.  Metamucil, 1 tablespoon once or twice daily can be used to keep bowels regular if needed. You may also use Fiber Con once daily, if Metamucil does not help.   Toileting tips to help with your constipation - Drink at least 64-80 ounces of water/liquid per day. - Establish a time to try to move your bowels every day.  For many people, this is after a cup of coffee or after a meal such as breakfast. - Sit all of the way back on the toilet keeping your back fairly straight and while sitting up, try to rest the tops of your forearms on your upper thighs.   - Raising your feet with a step stool/squatty potty can be helpful to improve the angle that allows your stool to pass through the rectum. - Relax the rectum feeling it bulge toward the toilet water.  If you feel your rectum raising toward your body, you are contracting rather than relaxing. - Breathe in and slowly exhale. "Belly breath" by expanding your belly towards your belly button. Keep belly expanded as you gently direct pressure down and back to the anus.  A low pitched GRRR sound can assist with increasing intra-abdominal pressure.  - Repeat 3-4 times. If unsuccessful, contract the pelvic floor to restore normal tone and get off the toilet.  Avoid excessive straining. - To reduce excessive wiping by teaching your anus to normally contract, place hands on outer aspect of knees and resist knee movement outward.  Hold 5-10 second then place hands just inside of knees and resist inward movement of knees.  Hold 5 seconds.  Repeat a few times each way.  We will see you back in the office in 3 months. Office will call to schedule next appointment.   Thank you for choosing me and [facility].  Dr. Rush Landmark

## 2020-09-01 NOTE — Progress Notes (Signed)
[facility] VISIT   Primary Care Provider [address], [address] [place] [address] [phone]  Patient Profile: Brad Pineda. is a 45 y.o. male with a pmh significant for hypertension, hyperlipidemia, OSA, MDD, anxiety, diabetes, ?GERD, colon polyps (TAs), hemorrhoids.  The patient presents to the [facility] for an evaluation and management of problem(s) noted below:  Problem List 1. Rectal bleeding   2. Hemorrhoids, unspecified hemorrhoid type   3. Rectal prolapse   4. Gastroesophageal reflux disease, unspecified whether esophagitis present     History of Present Illness This is a patient who I met in November for a direct colonoscopy for colon cancer screening.  The patient described incidental rectal bleeding occurring at times as well.  Colonoscopy findings suggested multiple colon polyps that returned as a combination of tubular adenomas as well as hyperplastic polyps with a plan for a 3-year follow-up colonoscopy.  The patient was also found to have evidence of hemorrhoidal disease (internal and external) as well as some endoscopic findings concerning for possible proctitis.  Pathology however, did not show evidence of overt chronic proctitis but suggested the possibility of mild mucosal/rectal prolapse.  The patient was initiated on Anusol suppositories.  The patient returns today for scheduled follow-up.  The patient states that after completing his Anusol suppositories he notices significant improvement.  From having 1-2 wipes per week having blood on the toilet paper he no longer had that for a few weeks.  However soon after completion of the Anusol suppository treatment he began to experience bleeding once again.  He did not initiate fiber supplementation as of yet.  The patient describes that in the early 2000's he began to experience chest pain/chest discomfort.  Eventually was put on Prevacid and he  has had significant improvement in regards to his GERD symptoms.  He very infrequently has any symptoms as long as he takes his medication but if he does not take his medication he certainly feels significant pyrosis.  The patient denies any overt odynophagia or dysphagia symptoms.  Patient has never had an upper endoscopy.  The patient does not take significant nonsteroidals or BC/Goody powders.  GI Review of Systems Positive as above Negative for globus, nausea, vomiting, pain, change in bowel habits, melena  Review of Systems General: Denies fevers/chills/weight loss unintentionally Cardiovascular: Denies chest pain/palpitations Pulmonary: Denies shortness of breath/nocturnal cough Gastroenterological: See HPI Genitourinary: Denies darkened urine Hematological: Denies easy bruising/bleeding Endocrine: Denies temperature intolerance Dermatological: Denies jaundice Psychological: Mood is stable   Medications Current Outpatient Medications  Medication Sig Dispense Refill  . Calcium Carbonate-Vitamin D (CALCIUM-VITAMIN D3) 600-125 MG-UNIT TABS Take 1 tablet by mouth in the morning and at bedtime.    . cetirizine (ZYRTEC) 10 MG tablet Take 10 mg by mouth daily.    . Dapagliflozin-metFORMIN HCl ER (XIGDUO XR) 12-998 MG TB24 Take by mouth.    . fenofibrate (TRICOR) 145 MG tablet TAKE 1 TABLET (145 MG TOTAL) BY MOUTH DAILY. 90 tablet 3  . fish oil-omega-3 fatty acids 1000 MG capsule Take 1 g by mouth daily.     . fluticasone (FLONASE) 50 MCG/ACT nasal spray SPRAY 2 SPRAYS INTO EACH NOSTRIL EVERY DAY 19 g 2  . gabapentin (NEURONTIN) 300 MG capsule Take 300 mg by mouth daily.    Marland Kitchen glipiZIDE (GLUCOTROL XL) 10 MG 24 hr tablet TAKE 1 TABLET (10 MG TOTAL) BY MOUTH DAILY WITH BREAKFAST. 90 tablet 3  . hydrochlorothiazide (HYDRODIURIL) 12.5 MG  tablet Take 1 tablet (12.5 mg total) by mouth daily. 90 tablet 3  . ipratropium (ATROVENT) 0.03 % nasal spray Place 2 sprays into both nostrils every 12  (twelve) hours. 90 mL 3  . lansoprazole (PREVACID) 15 MG capsule Take 15 mg by mouth daily.    Marland Kitchen losartan (COZAAR) 100 MG tablet TAKE 1 TABLET BY MOUTH EVERY DAY 90 tablet 1  . PARoxetine (PAXIL) 40 MG tablet TAKE 1 TABLET (40 MG TOTAL) BY MOUTH DAILY. 90 tablet 3  . rosuvastatin (CRESTOR) 10 MG tablet Take 10 mg by mouth at bedtime.    . TRULICITY 3.78 HY/8.5OY SOPN Inject into the skin.     No current facility-administered medications for this visit.    Allergies No Known Allergies  Histories Past Medical History:  Diagnosis Date  . Acute meniscal tear of left knee 2007  . Acute meniscal tear of left knee 2007  . Anxiety   . Depression   . Diabetes mellitus type 2, noninsulin dependent ([place])   . Family history of anesthesia complication    states father is hard to wake up post-op  . GERD (gastroesophageal reflux disease)   . Hyperlipidemia   . Hypertension    under control with meds., has been on med. since 2009  . Rectal prolapse   . Seasonal allergies   . Sleep apnea    uses CPAP nightly  . Trimalleolar fracture of left ankle 02/06/2014   Past Surgical History:  Procedure Laterality Date  . FOOT SURGERY    . ORIF ANKLE FRACTURE Left 02/10/2014   Procedure: LEFT ANKLE: FRACTURE OPEN TREATMENT TRIMALLEOLAR ANKLE INCLUDES INTERNAL FIXATION WITH FIXATION OF POSTERIOR LIP;  Surgeon: Renette Butters, MD;  Location: [facility];  Service: Orthopedics;  Laterality: Left;   Social History   Socioeconomic History  . Marital status: Married    Spouse name: Dorine  . Number of children: 0  . Years of education: 4  . Highest education level: Not on file  Occupational History  . Occupation: Electronics engineer: [employer]  Tobacco Use  . Smoking status: Never Smoker  . Smokeless tobacco: Never Used  Vaping Use  . Vaping Use: Never used  Substance and Sexual Activity  . Alcohol use: No  . Drug use: No  . Sexual activity: Yes    Partners:  Female  Other Topics Concern  . Not on file  Social History Narrative   Patient is married since 12/2010(Dorine) and Lives with his wife.   Patient is working full-time.   Patient has a BA degree.   Patient is right-handed.   Patient drinks two cups of soda and tea occasionally.   Social Determinants of Health   Financial Resource Strain: Not on file  Food Insecurity: Not on file  Transportation Needs: Not on file  Physical Activity: Not on file  Stress: Not on file  Social Connections: Not on file  Intimate Partner Violence: Not on file   Family History  Problem Relation Age of Onset  . Hypertension Mother   . Diabetes Mother   . Colon polyps Mother   . Cancer Mother        neuroendocrine tumor top of pancreas  . Anesthesia problems Father        hard to wake up post-op  . Colon polyps Father   . Kidney Stones Father   . Colon cancer Neg Hx   . Esophageal cancer Neg Hx   . Rectal  cancer Neg Hx   . Stomach cancer Neg Hx   . Inflammatory bowel disease Neg Hx   . Pancreatic cancer Neg Hx    I have reviewed his medical, social, and family history in detail and updated the electronic medical record as necessary.    PHYSICAL EXAMINATION  BP 120/68   Pulse 92   Ht 5' 8"  (1.727 m)   Wt 195 lb 12.8 oz (88.8 kg)   SpO2 94%   BMI 29.77 kg/m  Wt Readings from Last 3 Encounters:  09/01/20 195 lb 12.8 oz (88.8 kg)  06/22/20 193 lb (87.5 kg)  06/07/20 193 lb (87.5 kg)  GEN: NAD, appears stated age, doesn't appear chronically ill PSYCH: Cooperative, without pressured speech EYE: Conjunctivae pink, sclerae anicteric ENT: Masked CV: Nontachycardic RESP: No audible wheezing GI: NABS, soft, rounded, NT, without rebound GU: DRE deferred MSK/EXT: No lower extremity edema SKIN: No jaundice NEURO:  Alert & Oriented x 3, no focal deficits   REVIEW OF DATA  I reviewed the following data at the time of this encounter:  GI Procedures and Studies  November 2021 colonoscopy -  Perianal skin tags found on perianal exam. Hemorrhoids found on digital rectal exam. - The examined portion of the ileum was normal. - Normal mucosa in the recto-sigmoid colon, in the sigmoid colon, in the descending colon, at the splenic flexure, in the transverse colon, at the hepatic flexure, in the ascending colon and in the cecum. - Rule out Proctitis. Inflammation was found from the anus to the rectum. This was graded as Mayo Score 2 (moderate disease). Biopsied. - Seven 3 to 15 mm polyps in the descending colon and in the ascending colon, removed with a cold snare. Resected and retrieved. - Diverticulosis in the recto-sigmoid colon and in the sigmoid colon. - Nodules at dentate line. Biopsied. - Non-bleeding non-thrombosed external and internal hemorrhoids.  Pathology Diagnosis 1. Surgical [P], colon, ascending and descending, polyp (7) - TUBULAR ADENOMA(S) WITHOUT HIGH-GRADE DYSPLASIA OR MALIGNANCY - HYPERPLASTIC POLYP(S) 2. Surgical [P], colon, rectum - POLYPOID RECTAL MUCOSA WITH MUSCULARIZATION OF LAMINA PROPRIA, CONSISTENT WITH MUCOSAL PROLAPSE 3. Surgical [P], dentate line - ANORECTAL MUCOSA WITH REACTIVE CHANGES - NEGATIVE FOR DYSPLASIA  Laboratory Studies  Reviewed those in epic  Imaging Studies  No relevant studies to review   ASSESSMENT  Mr. Louvier is a 45 y.o. male with a pmh significant for hypertension, hyperlipidemia, OSA, MDD, anxiety, diabetes, ?GERD, colon polyps (TAs), hemorrhoids.  The patient is seen today for evaluation and management of:  1. Rectal bleeding   2. Hemorrhoids, unspecified hemorrhoid type   3. Rectal prolapse   4. Gastroesophageal reflux disease, unspecified whether esophagitis present    The patient is hemodynamically and clinically stable.  I believe the patient's intermittent low volume hematochezia is most likely result of his hemorrhoidal disease.  He had significant improvement after taking Anusol suppositories.  His hemorrhoidal  burden is both internal as well as external with skin tags.  We will try to optimize his bowel habits with increased amount of fiber supplementation as well as MiraLAX on a as needed basis if needed if he begins to strain.  I have recommended Preparation H ointment to be used 1-2 times daily as well as sitz bath's to be used nightly as able.  Toileting techniques were discussed with the patient for optimization to try and minimize risk of straining.  We will monitor his symptoms the course of the coming months and depending on how he  does will consider referral to colorectal surgery for hopeful conservative measures.  In regards to the patient having chronic acid reflux, he meets criteria for consideration of Barrett's esophagus screening.  Thankfully, he has no red flag symptoms other than the length of time that he has been on PPI therapy.  He wants time to think about consideration of an upper endoscopy which is reasonable based on not having any other red flag symptoms.  At follow-up we will discuss potential diagnostic endoscopy.  All patient questions were answered to the best of my ability, and the patient agrees to the aforementioned plan of action with follow-up as indicated.   PLAN  Continue Prevacid If pyrosis symptoms worsen or patient has any evidence of dysphagia he will let us know and a diagnostic endoscopy will be considered at that time Otherwise consider Barrett's screen in the course of the coming months as per patient comfort ability FiberCon or Metamucil once to twice daily Toileting techniques discussed for optimization of bowel habits If patient continues to have issues we will consider referral to colorectal surgery for evaluation of his hemorrhoidal disease   No orders of the defined types were placed in this encounter.   New Prescriptions   No medications on file   Modified Medications   No medications on file    Planned Follow Up Return in about 3 months (around  11/30/2020).   Total Time in Face-to-Face and in Coordination of Care for patient including independent/personal interpretation/review of prior testing, medical history, examination, medication adjustment, communicating results with the patient directly, and documentation with the EHR is 25 minutes.   Justice Britain, MD [facility] Office # [phone]

## 2020-09-02 ENCOUNTER — Encounter: Payer: Self-pay | Admitting: Gastroenterology

## 2020-09-02 DIAGNOSIS — K625 Hemorrhage of anus and rectum: Secondary | ICD-10-CM | POA: Insufficient documentation

## 2020-09-02 DIAGNOSIS — K649 Unspecified hemorrhoids: Secondary | ICD-10-CM | POA: Insufficient documentation

## 2020-09-02 DIAGNOSIS — K623 Rectal prolapse: Secondary | ICD-10-CM | POA: Insufficient documentation

## 2022-04-14 ENCOUNTER — Ambulatory Visit
Admission: RE | Admit: 2022-04-14 | Discharge: 2022-04-14 | Disposition: A | Discharge status: Home / Self Care | Payer: Commercial Managed Care - PPO | Source: Ambulatory Visit | Attending: Emergency Medicine | Admitting: Emergency Medicine

## 2022-04-14 VITALS — BP 122/81 | HR 110 | Temp 99.1°F | Resp 16

## 2022-04-14 DIAGNOSIS — U071 COVID-19: Secondary | ICD-10-CM | POA: Insufficient documentation

## 2022-04-14 DIAGNOSIS — B349 Viral infection, unspecified: Secondary | ICD-10-CM

## 2022-04-14 LAB — RESP PANEL BY RT-PCR (FLU A&B, COVID) ARPGX2
Influenza A by PCR: NEGATIVE
Influenza B by PCR: NEGATIVE
SARS Coronavirus 2 by RT PCR: POSITIVE — AB

## 2022-04-14 NOTE — Discharge Instructions (Signed)
The result of your viral PCR testing for COVID-19 and influenza will be posted to your MyChart once it is complete, typically this takes 6 to 12 hours.  If there is a positive result, you will be contacted by phone with further recommendations.   Please see the list below for recommended medications, dosages and frequencies to provide relief of your current symptoms:     Advil, Motrin (ibuprofen): This is a good anti-inflammatory medication which addresses aches, pains and inflammation of the upper airways that causes sinus and nasal congestion as well as in the lower airways which makes your cough feel tight and sometimes burn.  I recommend that you take between 400 to 600 mg every 6-8 hours as needed.      Tylenol (acetaminophen): This is a good fever reducer.  If your body temperature rises above 101.5 as measured with a thermometer, it is recommended that you take 1,000 mg every 8 hours until your temperature falls below 101.5, please not take more than 3,000 mg of acetaminophen either as a separate medication or as in ingredient in an over-the-counter cold/flu preparation within a 24-hour period.      Sudafed (pseudoephedrine): This is a decongestant.  This medication has to be purchased from the pharmacist counter, I recommend taking 2 tablets, 60 mg, 2-3 times a day as needed to relieve runny nose and sinus drainage.  This medication is available over-the-counter however I have sent a prescription for your convenience.   Robitussin, Mucinex (guaifenesin): This is an expectorant.  This helps break up chest congestion and loosen up thick nasal drainage making phlegm and drainage more liquid and therefore easier to remove.  I recommend being 400 mg three times daily as needed.      Chloraseptic Throat Spray: Spray 5 sprays into affected area every 2 hours, hold for 15 seconds and either swallow or spit it out.  This is a excellent numbing medication because it is a spray, you can put it right where  you needed and so sucking on a lozenge and numbing your entire mouth.        Please follow-up within the next 5-7 days either with your primary care provider or urgent care if your symptoms do not resolve.  If you do not have a primary care provider, we will assist you in finding one.   Thank you for visiting urgent care today.  We appreciate the opportunity to participate in your care.

## 2022-04-14 NOTE — ED Provider Notes (Signed)
[facility]    CSN: [id number] Arrival date & time: 04/14/22  1519    HISTORY   Chief Complaint  Patient presents with   Sore Throat    I have a cough & stuffy head too - Entered by patient   Cough   HPI Brad Pineda. is a pleasant, 46 y.o. male who presents to urgent care today. Patient complains of sore throat, nonproductive cough cough and congestion since 2 days ago.  States he actually had diarrhea 6 days ago that lasted 3 days.  Patient states he is currently treating symptoms with cough drops without meaningful relief.  Patient denies fever, chills, nausea, vomiting, ear pain.  Patient reports a history of frequent sinus infections and seasonal allergies, not currently taking any allergy medications.  The history is provided by the patient.   Past Medical History:  Diagnosis Date   Acute meniscal tear of left knee 2007   Acute meniscal tear of left knee 2007   Anxiety    Depression    Diabetes mellitus type 2, noninsulin dependent ([place])    Family history of anesthesia complication    states father is hard to wake up post-op   GERD (gastroesophageal reflux disease)    Hyperlipidemia    Hypertension    under control with meds., has been on med. since 2009   Rectal prolapse    Seasonal allergies    Sleep apnea    uses CPAP nightly   Trimalleolar fracture of left ankle 02/06/2014   Patient Active Problem List   Diagnosis Date Noted   Rectal prolapse 09/02/2020   Hemorrhoids 09/02/2020   Rectal bleeding 09/02/2020   Glaucoma 67/07/4579   Nonalcoholic steatohepatitis ([facility]) 08/24/2015   OSA on CPAP 12/10/2013   BMI 30.0-30.9,adult 05/13/2013   Diabetes mellitus type 2, uncontrolled, with complications    Essential hypertension, benign    Mixed hyperlipidemia    GERD (gastroesophageal reflux disease)    Anxiety    Past Surgical History:  Procedure Laterality Date   FOOT SURGERY     ORIF ANKLE FRACTURE Left 02/10/2014   Procedure: LEFT ANKLE:  FRACTURE OPEN TREATMENT TRIMALLEOLAR ANKLE INCLUDES INTERNAL FIXATION WITH FIXATION OF POSTERIOR LIP;  Surgeon: Renette Butters, MD;  Location: [facility];  Service: Orthopedics;  Laterality: Left;    Home Medications    Prior to Admission medications   Medication Sig Start Date End Date Taking? Authorizing Provider  busPIRone (BUSPAR) 15 MG tablet TAKE ONE TABLET BY MOUTH 2 TIMES DAILY. 11/18/21  Yes [provider]  losartan (COZAAR) 100 MG tablet Take by mouth. 01/31/21  Yes [provider]  montelukast (SINGULAIR) 10 MG tablet Take by mouth. 10/26/21  Yes [provider]  sitaGLIPtin (JANUVIA) 50 MG tablet Take by mouth. 01/29/22  Yes [provider]  Calcium Carbonate-Vitamin D (CALCIUM-VITAMIN D3) 600-125 MG-UNIT TABS Take 1 tablet by mouth in the morning and at bedtime.    [provider]  cetirizine (ZYRTEC) 10 MG tablet Take 10 mg by mouth daily.    [provider]  Dapagliflozin-metFORMIN HCl ER (XIGDUO XR) 12-998 MG TB24 Take by mouth. 07/24/19   [provider]  fenofibrate (TRICOR) 145 MG tablet TAKE 1 TABLET (145 MG TOTAL) BY MOUTH DAILY. 01/16/18   Harrison Mons, PA  fish oil-omega-3 fatty acids 1000 MG capsule Take 1 g by mouth daily.     [provider]  fluticasone (FLONASE) 50 MCG/ACT nasal spray SPRAY 2 SPRAYS INTO  EACH NOSTRIL EVERY DAY 01/18/18   Harrison Mons, PA  gabapentin (NEURONTIN) 300 MG capsule Take 300 mg by mouth daily. 03/11/20   [provider]  glipiZIDE (GLUCOTROL XL) 10 MG 24 hr tablet TAKE 1 TABLET (10 MG TOTAL) BY MOUTH DAILY WITH BREAKFAST. 11/13/17   Harrison Mons, PA  hydrochlorothiazide (HYDRODIURIL) 12.5 MG tablet Take 1 tablet (12.5 mg total) by mouth daily. 01/02/17   Harrison Mons, PA  ipratropium (ATROVENT) 0.03 % nasal spray Place 2 sprays into both nostrils every 12 (twelve) hours. 07/18/17   Harrison Mons, PA  lansoprazole (PREVACID) 15 MG capsule  Take 15 mg by mouth daily.    [provider]  losartan (COZAAR) 100 MG tablet TAKE 1 TABLET BY MOUTH EVERY DAY 10/26/17   Harrison Mons, PA  PARoxetine (PAXIL) 40 MG tablet TAKE 1 TABLET (40 MG TOTAL) BY MOUTH DAILY. 01/16/18   Harrison Mons, PA  rosuvastatin (CRESTOR) 10 MG tablet Take 10 mg by mouth at bedtime. 04/21/20   [provider]  TRULICITY 0.86 VH/8.4ON SOPN Inject into the skin. 05/20/20   [provider]    Family History Family History  Problem Relation Age of Onset   Hypertension Mother    Diabetes Mother    Colon polyps Mother    Cancer Mother        neuroendocrine tumor top of pancreas   Anesthesia problems Father        hard to wake up post-op   Colon polyps Father    Kidney Stones Father    Colon cancer Neg Hx    Esophageal cancer Neg Hx    Rectal cancer Neg Hx    Stomach cancer Neg Hx    Inflammatory bowel disease Neg Hx    Pancreatic cancer Neg Hx    Social History Social History   Tobacco Use   Smoking status: Never   Smokeless tobacco: Never  Vaping Use   Vaping Use: Never used  Substance Use Topics   Alcohol use: No   Drug use: No   Allergies   Patient has no known allergies.  Review of Systems Review of Systems Pertinent findings revealed after performing a 14 point review of systems has been noted in the history of present illness.  Physical Exam Triage Vital Signs ED Triage Vitals  Enc Vitals Group     BP 06/03/21 0827 (!) 147/82     Pulse Rate 06/03/21 0827 72     Resp 06/03/21 0827 18     Temp 06/03/21 0827 98.3 F (36.8 C)     Temp Source 06/03/21 0827 Oral     SpO2 06/03/21 0827 98 %     Weight --      Height --      Head Circumference --      Peak Flow --      Pain Score 06/03/21 0826 5     Pain Loc --      Pain Edu? --      Excl. in [place]? --   No data found.  Updated Vital Signs BP 122/81 (BP Location: Right Arm)   Pulse (S) (!) 110   Temp 99.1 F (37.3 C) (Oral)   Resp 16   SpO2 94%    Physical Exam Vitals and nursing note reviewed.  Constitutional:      General: He is not in acute distress.    Appearance: Normal appearance. He is not ill-appearing.  HENT:     Head: Normocephalic and atraumatic.  Salivary Glands: Right salivary gland is not diffusely enlarged or tender. Left salivary gland is not diffusely enlarged or tender.     Right Ear: Tympanic membrane, ear canal and external ear normal. No drainage. No middle ear effusion. There is no impacted cerumen. Tympanic membrane is not erythematous or bulging.     Left Ear: Tympanic membrane, ear canal and external ear normal. No drainage.  No middle ear effusion. There is no impacted cerumen. Tympanic membrane is not erythematous or bulging.     Nose: Nose normal. No nasal deformity, septal deviation, mucosal edema, congestion or rhinorrhea.     Right Turbinates: Not enlarged, swollen or pale.     Left Turbinates: Not enlarged, swollen or pale.     Right Sinus: No maxillary sinus tenderness or frontal sinus tenderness.     Left Sinus: No maxillary sinus tenderness or frontal sinus tenderness.     Mouth/Throat:     Lips: Pink. No lesions.     Mouth: Mucous membranes are moist. No oral lesions.     Pharynx: Oropharynx is clear. Uvula midline. No posterior oropharyngeal erythema or uvula swelling.     Tonsils: No tonsillar exudate. 0 on the right. 0 on the left.  Eyes:     General: Lids are normal.        Right eye: No discharge.        Left eye: No discharge.     Extraocular Movements: Extraocular movements intact.     Conjunctiva/sclera: Conjunctivae normal.     Right eye: Right conjunctiva is not injected.     Left eye: Left conjunctiva is not injected.  Neck:     Trachea: Trachea and phonation normal.  Cardiovascular:     Rate and Rhythm: Normal rate and regular rhythm.     Pulses: Normal pulses.     Heart sounds: Normal heart sounds. No murmur heard.    No friction rub. No gallop.  Pulmonary:     Effort:  Pulmonary effort is normal. No accessory muscle usage, prolonged expiration or respiratory distress.     Breath sounds: Normal breath sounds. No stridor, decreased air movement or transmitted upper airway sounds. No decreased breath sounds, wheezing, rhonchi or rales.  Chest:     Chest wall: No tenderness.  Musculoskeletal:        General: Normal range of motion.     Cervical back: Normal range of motion and neck supple. Normal range of motion.  Lymphadenopathy:     Cervical: No cervical adenopathy.  Skin:    General: Skin is warm and dry.     Findings: No erythema or rash.  Neurological:     General: No focal deficit present.     Mental Status: He is alert and oriented to person, place, and time.  Psychiatric:        Mood and Affect: Mood normal.        Behavior: Behavior normal.     Visual Acuity Right Eye Distance:   Left Eye Distance:   Bilateral Distance:    Right Eye Near:   Left Eye Near:    Bilateral Near:     UC Couse / Diagnostics / Procedures:     Radiology No results found.  Procedures Procedures (including critical care time) EKG  Pending results:  Labs Reviewed  RESP PANEL BY RT-PCR (FLU A&B, COVID) [id number]    Medications Ordered in UC: Medications - No data to display  UC Diagnoses / Final Clinical Impressions(s)   I  have reviewed the triage vital signs and the nursing notes.  Pertinent labs & imaging results that were available during my care of the patient were reviewed by me and considered in my medical decision making (see chart for details).    Final diagnoses:  Viral illness   Physical exam that is unremarkable.  COVID-19 and influenza testing performed, will notify patient of results once complete.  Supportive care recommended.  Return precautions advised.  ED Prescriptions   None    PDMP not reviewed this encounter.  Disposition Upon Discharge:  Condition: stable for discharge home Home: take medications as prescribed; routine  discharge instructions as discussed; follow up as advised.  Patient presented with an acute illness with associated systemic symptoms and significant discomfort requiring urgent management. In my opinion, this is a condition that a prudent lay person (someone who possesses an average knowledge of health and medicine) may potentially expect to result in complications if not addressed urgently such as respiratory distress, impairment of bodily function or dysfunction of bodily organs.   Routine symptom specific, illness specific and/or disease specific instructions were discussed with the patient and/or caregiver at length.   As such, the patient has been evaluated and assessed, work-up was performed and treatment was provided in alignment with urgent care protocols and evidence based medicine.  Patient/parent/caregiver has been advised that the patient may require follow up for further testing and treatment if the symptoms continue in spite of treatment, as clinically indicated and appropriate.  If the patient was tested for COVID-19, Influenza and/or RSV, then the patient/parent/guardian was advised to isolate at home pending the results of his/her diagnostic coronavirus test and potentially longer if they're positive. I have also advised pt that if his/her COVID-19 test returns positive, it's recommended to self-isolate for at least 10 days after symptoms first appeared AND until fever-free for 24 hours without fever reducer AND other symptoms have improved or resolved. Discussed self-isolation recommendations as well as instructions for household member/close contacts as per the [facility] and [place] DHHS, and also gave patient the [facility] packet with this information.  Patient/parent/caregiver has been advised to return to the [facility] or PCP in 3-5 days if no better; to PCP or the Emergency Department if new signs and symptoms develop, or if the current signs or symptoms continue to change or worsen for further workup,  evaluation and treatment as clinically indicated and appropriate  The patient will follow up with their current PCP if and as advised. If the patient does not currently have a PCP we will assist them in obtaining one.   The patient may need specialty follow up if the symptoms continue, in spite of conservative treatment and management, for further workup, evaluation, consultation and treatment as clinically indicated and appropriate.  Patient/parent/caregiver verbalized understanding and agreement of plan as discussed.  All questions were addressed during visit.  Please see discharge instructions below for further details of plan.  Discharge Instructions:   Discharge Instructions      The result of your viral PCR testing for COVID-19 and influenza will be posted to your MyChart once it is complete, typically this takes 6 to 12 hours.  If there is a positive result, you will be contacted by phone with further recommendations.   Please see the list below for recommended medications, dosages and frequencies to provide relief of your current symptoms:     Advil, Motrin (ibuprofen): This is a good anti-inflammatory medication which addresses aches, pains and inflammation of the  upper airways that causes sinus and nasal congestion as well as in the lower airways which makes your cough feel tight and sometimes burn.  I recommend that you take between 400 to 600 mg every 6-8 hours as needed.      Tylenol (acetaminophen): This is a good fever reducer.  If your body temperature rises above 101.5 as measured with a thermometer, it is recommended that you take 1,000 mg every 8 hours until your temperature falls below 101.5, please not take more than 3,000 mg of acetaminophen either as a separate medication or as in ingredient in an over-the-counter cold/flu preparation within a 24-hour period.      Sudafed (pseudoephedrine): This is a decongestant.  This medication has to be purchased from the pharmacist  counter, I recommend taking 2 tablets, 60 mg, 2-3 times a day as needed to relieve runny nose and sinus drainage.  This medication is available over-the-counter however I have sent a prescription for your convenience.   Robitussin, Mucinex (guaifenesin): This is an expectorant.  This helps break up chest congestion and loosen up thick nasal drainage making phlegm and drainage more liquid and therefore easier to remove.  I recommend being 400 mg three times daily as needed.      Chloraseptic Throat Spray: Spray 5 sprays into affected area every 2 hours, hold for 15 seconds and either swallow or spit it out.  This is a excellent numbing medication because it is a spray, you can put it right where you needed and so sucking on a lozenge and numbing your entire mouth.        Please follow-up within the next 5-7 days either with your primary care provider or urgent care if your symptoms do not resolve.  If you do not have a primary care provider, we will assist you in finding one.   Thank you for visiting urgent care today.  We appreciate the opportunity to participate in your care.     This office note has been dictated using Museum/gallery curator.  Unfortunately, this method of dictation can sometimes lead to typographical or grammatical errors.  I apologize for your inconvenience in advance if this occurs.  Please do not hesitate to reach out to me if clarification is needed.      Lynden Oxford Scales, [place] 04/14/22 [phone]

## 2022-04-14 NOTE — ED Triage Notes (Signed)
Patient presents to UC for sore throat, cough and congestion since 2 days ago. Diarrhea since last Sunday resolved Tuesday. Treating symptoms with cough drops. Hx of sinus infection.   Denies fever.

## 2023-05-02 ENCOUNTER — Ambulatory Visit
Admission: RE | Admit: 2023-05-02 | Discharge: 2023-05-02 | Disposition: A | Discharge status: Home / Self Care | Payer: Commercial Managed Care - PPO | Source: Ambulatory Visit | Attending: Internal Medicine | Admitting: Internal Medicine

## 2023-05-02 VITALS — BP 150/92 | HR 80 | Temp 98.1°F | Resp 16

## 2023-05-02 DIAGNOSIS — M5441 Lumbago with sciatica, right side: Secondary | ICD-10-CM

## 2023-05-02 DIAGNOSIS — M5442 Lumbago with sciatica, left side: Secondary | ICD-10-CM | POA: Diagnosis not present

## 2023-05-02 MED ORDER — CYCLOBENZAPRINE HCL 10 MG PO TABS
10.0000 mg | ORAL_TABLET | Freq: Two times a day (BID) | ORAL | 0 refills | Status: DC | PRN
Start: 2023-05-02 — End: 2023-09-11

## 2023-05-02 MED ORDER — KETOROLAC TROMETHAMINE 30 MG/ML IJ SOLN
30.0000 mg | Freq: Once | INTRAMUSCULAR | Status: AC
  Administered 2023-05-02: 30 mg via INTRAMUSCULAR

## 2023-05-02 MED ORDER — METHYLPREDNISOLONE ACETATE 80 MG/ML IJ SUSP
40.0000 mg | Freq: Once | INTRAMUSCULAR | Status: AC
  Administered 2023-05-02: 40 mg via INTRAMUSCULAR

## 2023-05-02 MED ORDER — PREDNISONE 10 MG (21) PO TBPK
ORAL_TABLET | Freq: Every day | ORAL | 0 refills | Status: DC
Start: 2023-05-03 — End: 2023-09-11

## 2023-05-02 NOTE — ED Provider Notes (Signed)
[facility]    CSN: [id number] Arrival date & time: 05/02/23  1407      History   Chief Complaint Chief Complaint  Patient presents with   Leg Pain    I hurt my back about 4 weeks ago and about a week ago I started having pain all down my left leg. Pain subsided some in back but is much greater in left leg now. - Entered by patient    HPI Brad Pineda. is a 47 y.o. male presents for back pain.  Patient reports 1 month of an intermittent bilateral lower back pain over the past week began radiating down his right leg.  Denies any known injury or inciting event but has been working out a lot recently.  He denies any numbness/tingling/weakness of his lower extremities, no bowel or bladder incontinence, no saddle paresthesia.  No history of low back injuries or surgeries in the past.  He has been treating his symptoms with ibuprofen, heating pad, IcyHot with minimal relief.  No other concerns at this time.   Leg Pain Associated symptoms: back pain     Past Medical History:  Diagnosis Date   Acute meniscal tear of left knee 2007   Acute meniscal tear of left knee 2007   Anxiety    Depression    Diabetes mellitus type 2, noninsulin dependent (HCC)    Family history of anesthesia complication    states father is hard to wake up post-op   GERD (gastroesophageal reflux disease)    Hyperlipidemia    Hypertension    under control with meds., has been on med. since 2009   Rectal prolapse    Seasonal allergies    Sleep apnea    uses CPAP nightly   Trimalleolar fracture of left ankle 02/06/2014    Patient Active Problem List   Diagnosis Date Noted   Rectal prolapse 09/02/2020   Hemorrhoids 09/02/2020   Rectal bleeding 09/02/2020   Glaucoma 08/24/2015   Nonalcoholic steatohepatitis (NASH) 08/24/2015   OSA on CPAP 12/10/2013   BMI 30.0-30.9,adult 05/13/2013   Diabetes mellitus type 2, uncontrolled, with complications    Essential hypertension, benign    Mixed  hyperlipidemia    GERD (gastroesophageal reflux disease)    Anxiety     Past Surgical History:  Procedure Laterality Date   FOOT SURGERY     ORIF ANKLE FRACTURE Left 02/10/2014   Procedure: LEFT ANKLE: FRACTURE OPEN TREATMENT TRIMALLEOLAR ANKLE INCLUDES INTERNAL FIXATION WITH FIXATION OF POSTERIOR LIP;  Surgeon: Sheral Apley, MD;  Location: [facility];  Service: Orthopedics;  Laterality: Left;       Home Medications    Prior to Admission medications   Medication Sig Start Date End Date Taking? Authorizing Provider  cyclobenzaprine (FLEXERIL) 10 MG tablet Take 1 tablet (10 mg total) by mouth 2 (two) times daily as needed for muscle spasms. 05/02/23  Yes Radford Pax, NP  predniSONE (STERAPRED UNI-PAK 21 TAB) 10 MG (21) TBPK tablet Take by mouth daily. Take 6 tabs by mouth daily  for 1 day, then 5 tabs for 1 day, then 4 tabs for 1 day, then 3 tabs for 1 day, 2 tabs for 1 day, then 1 tab by mouth daily for 1 days 05/03/23  Yes Radford Pax, NP  tirzepatide [facility]) 10 MG/0.5ML Pen Inject 10 mg into the skin once a week.   Yes [provider]  busPIRone (BUSPAR) 15 MG tablet TAKE ONE TABLET BY MOUTH  2 TIMES DAILY. 11/18/21   [provider]  Calcium Carbonate-Vitamin D (CALCIUM-VITAMIN D3) 600-125 MG-UNIT TABS Take 1 tablet by mouth in the morning and at bedtime.    [provider]  cetirizine (ZYRTEC) 10 MG tablet Take 10 mg by mouth daily.    [provider]  Dapagliflozin-metFORMIN HCl ER (XIGDUO XR) 12-998 MG TB24 Take by mouth. 07/24/19   [provider]  fenofibrate (TRICOR) 145 MG tablet TAKE 1 TABLET (145 MG TOTAL) BY MOUTH DAILY. 01/16/18   Porfirio Oar, PA  fish oil-omega-3 fatty acids 1000 MG capsule Take 1 g by mouth daily.     [provider]  fluticasone (FLONASE) 50 MCG/ACT nasal spray SPRAY 2 SPRAYS INTO EACH NOSTRIL EVERY DAY 01/18/18   Porfirio Oar, PA  gabapentin (NEURONTIN) 300 MG capsule Take 300  mg by mouth daily. 03/11/20   [provider]  glipiZIDE (GLUCOTROL XL) 10 MG 24 hr tablet TAKE 1 TABLET (10 MG TOTAL) BY MOUTH DAILY WITH BREAKFAST. Patient taking differently: 2.5 mg. TAKE 1 TABLET (10 MG TOTAL) BY MOUTH DAILY WITH BREAKFAST. 11/13/17   Porfirio Oar, PA  hydrochlorothiazide (HYDRODIURIL) 12.5 MG tablet Take 1 tablet (12.5 mg total) by mouth daily. 01/02/17   Porfirio Oar, PA  ipratropium (ATROVENT) 0.03 % nasal spray Place 2 sprays into both nostrils every 12 (twelve) hours. 07/18/17   Porfirio Oar, PA  lansoprazole (PREVACID) 15 MG capsule Take 15 mg by mouth daily.    [provider]  losartan (COZAAR) 100 MG tablet TAKE 1 TABLET BY MOUTH EVERY DAY 10/26/17   Porfirio Oar, PA  losartan (COZAAR) 100 MG tablet Take by mouth. 01/31/21   [provider]  montelukast (SINGULAIR) 10 MG tablet Take by mouth. 10/26/21   [provider]  PARoxetine (PAXIL) 40 MG tablet TAKE 1 TABLET (40 MG TOTAL) BY MOUTH DAILY. 01/16/18   Porfirio Oar, PA  rosuvastatin (CRESTOR) 10 MG tablet Take 10 mg by mouth at bedtime. 04/21/20   [provider]  sitaGLIPtin (JANUVIA) 50 MG tablet Take by mouth. 01/29/22   [provider]  TRULICITY 0.75 MG/0.5ML SOPN Inject into the skin. 05/20/20   [provider]    Family History Family History  Problem Relation Age of Onset   Hypertension Mother    Diabetes Mother    Colon polyps Mother    Cancer Mother        neuroendocrine tumor top of pancreas   Anesthesia problems Father        hard to wake up post-op   Colon polyps Father    Kidney Stones Father    Colon cancer Neg Hx    Esophageal cancer Neg Hx    Rectal cancer Neg Hx    Stomach cancer Neg Hx    Inflammatory bowel disease Neg Hx    Pancreatic cancer Neg Hx     Social History Social History   Tobacco Use   Smoking status: Never   Smokeless tobacco: Never  Vaping Use   Vaping status: Never Used  Substance Use Topics    Alcohol use: No   Drug use: No     Allergies   Patient has no known allergies.   Review of Systems Review of Systems  Musculoskeletal:  Positive for back pain.     Physical Exam Triage Vital Signs ED Triage Vitals  Encounter Vitals Group     BP 05/02/23 1424 (!) 150/92     Systolic BP Percentile --  Diastolic BP Percentile --      Pulse Rate 05/02/23 1424 80     Resp 05/02/23 1424 16     Temp 05/02/23 1424 98.1 F (36.7 C)     Temp Source 05/02/23 1424 Oral     SpO2 05/02/23 1424 96 %     Weight --      Height --      Head Circumference --      Peak Flow --      Pain Score 05/02/23 1426 10     Pain Loc --      Pain Education --      Exclude from Growth Chart --    No data found.  Updated Vital Signs BP (!) 150/92 (BP Location: Left Arm)   Pulse 80   Temp 98.1 F (36.7 C) (Oral)   Resp 16   SpO2 96%   Visual Acuity Right Eye Distance:   Left Eye Distance:   Bilateral Distance:    Right Eye Near:   Left Eye Near:    Bilateral Near:     Physical Exam Vitals and nursing note reviewed.  Constitutional:      General: He is not in acute distress.    Appearance: Normal appearance. He is not ill-appearing.  HENT:     Head: Normocephalic and atraumatic.  Eyes:     Pupils: Pupils are equal, round, and reactive to light.  Cardiovascular:     Rate and Rhythm: Normal rate.  Pulmonary:     Effort: Pulmonary effort is normal.  Musculoskeletal:     Lumbar back: Spasms and tenderness present. No swelling, edema, deformity, signs of trauma, lacerations or bony tenderness. Normal range of motion. Positive right straight leg raise test and positive left straight leg raise test. No scoliosis.       Back:     Comments: Strength is 5 out of 5 bilateral lower extremities  Skin:    General: Skin is warm and dry.  Neurological:     General: No focal deficit present.     Mental Status: He is alert and oriented to person, place, and time.     Deep Tendon  Reflexes:     Reflex Scores:      Patellar reflexes are 2+ on the right side and 2+ on the left side. Psychiatric:        Mood and Affect: Mood normal.        Behavior: Behavior normal.      UC Treatments / Results  Labs (all labs ordered are listed, but only abnormal results are displayed) Labs Reviewed - No data to display  EKG   Radiology No results found.  Procedures Procedures (including critical care time)  Medications Ordered in UC Medications  ketorolac (TORADOL) 30 MG/ML injection 30 mg (30 mg Intramuscular Given 05/02/23 1453)  methylPREDNISolone acetate (DEPO-MEDROL) injection 40 mg (40 mg Intramuscular Given 05/02/23 1453)    Initial Impression / Assessment and Plan / UC Course  I have reviewed the triage vital signs and the nursing notes.  Pertinent labs & imaging results that were available during my care of the patient were reviewed by me and considered in my medical decision making (see chart for details).     Reviewed exam and symptoms with patient.  No red flags.  Discussed low back strain/pain with sciatic involvement.  Toradol injection in clinic as well as Depo-Medrol.  Instructed no NSAIDs for 24 hours and he verbalized understanding.  Will do trial  of Flexeril, side effect profile reviewed.  Start prednisone tomorrow, 9/26.  Heat and rest.  PCP follow-up was reviewed and patient verbalized understanding. Final Clinical Impressions(s) / UC Diagnoses   Final diagnoses:  Acute bilateral low back pain with bilateral sciatica     Discharge Instructions      You were given a Toradol injection in clinic today. Do not take any over the counter NSAID's such as Advil, ibuprofen, Aleve, or naproxen for 24 hours. You may take tylenol if needed.  You also given injection of a steroid to your sciatica pain.  Please start the steroid Dosepak tomorrow, 9/26.  Restart Flexeril today as needed.  Patient's medication make you drowsy.  Do not drink alcohol or drive on  this medication.  Heat and rest to the back.  PCP follow-up if your symptoms do not improve.  Please go to the ER if you develop any worsening symptoms.  I hope you feel better soon!     ED Prescriptions     Medication Sig Dispense Auth. Provider   cyclobenzaprine (FLEXERIL) 10 MG tablet Take 1 tablet (10 mg total) by mouth 2 (two) times daily as needed for muscle spasms. 10 tablet Radford Pax, NP   predniSONE (STERAPRED UNI-PAK 21 TAB) 10 MG (21) TBPK tablet Take by mouth daily. Take 6 tabs by mouth daily  for 1 day, then 5 tabs for 1 day, then 4 tabs for 1 day, then 3 tabs for 1 day, 2 tabs for 1 day, then 1 tab by mouth daily for 1 days 21 tablet Radford Pax, NP      PDMP not reviewed this encounter.   Radford Pax, NP 05/02/23 [phone]

## 2023-05-02 NOTE — Discharge Instructions (Signed)
You were given a Toradol injection in clinic today. Do not take any over the counter NSAID's such as Advil, ibuprofen, Aleve, or naproxen for 24 hours. You may take tylenol if needed.  You also given injection of a steroid to your sciatica pain.  Please start the steroid Dosepak tomorrow, 9/26.  Restart Flexeril today as needed.  Patient's medication make you drowsy.  Do not drink alcohol or drive on this medication.  Heat and rest to the back.  PCP follow-up if your symptoms do not improve.  Please go to the ER if you develop any worsening symptoms.  I hope you feel better soon!

## 2023-05-02 NOTE — ED Triage Notes (Signed)
Pt presents to UC w/ c/o lower back and right leg pain x1 month. Pt states he has been working losing weight and working out and one month ago, he started to feel a sharp pain in his lower back. The right leg started hurting 3 weeks ago. Pt has tried ibuprofen, heating pad, and icy hot without relief.  Pt has an appt w/ his "diabetes doctor in a couple weeks." No hx of fall, direct injury

## 2023-05-03 ENCOUNTER — Other Ambulatory Visit: Payer: Self-pay

## 2023-05-03 ENCOUNTER — Emergency Department (HOSPITAL_COMMUNITY)
Admission: EM | Admit: 2023-05-03 | Discharge: 2023-05-03 | Disposition: A | Discharge status: Home / Self Care | Payer: Commercial Managed Care - PPO | Attending: Emergency Medicine | Admitting: Emergency Medicine

## 2023-05-03 ENCOUNTER — Encounter (HOSPITAL_COMMUNITY): Payer: Self-pay | Admitting: Emergency Medicine

## 2023-05-03 DIAGNOSIS — I10 Essential (primary) hypertension: Secondary | ICD-10-CM | POA: Insufficient documentation

## 2023-05-03 DIAGNOSIS — E119 Type 2 diabetes mellitus without complications: Secondary | ICD-10-CM | POA: Diagnosis not present

## 2023-05-03 DIAGNOSIS — Z79899 Other long term (current) drug therapy: Secondary | ICD-10-CM | POA: Insufficient documentation

## 2023-05-03 DIAGNOSIS — Z7984 Long term (current) use of oral hypoglycemic drugs: Secondary | ICD-10-CM | POA: Insufficient documentation

## 2023-05-03 DIAGNOSIS — M545 Low back pain, unspecified: Secondary | ICD-10-CM | POA: Diagnosis present

## 2023-05-03 DIAGNOSIS — M5441 Lumbago with sciatica, right side: Secondary | ICD-10-CM | POA: Insufficient documentation

## 2023-05-03 MED ORDER — HYDROCODONE-ACETAMINOPHEN 5-325 MG PO TABS: 1.0000 | ORAL_TABLET | Freq: Four times a day (QID) | ORAL | 0 refills | Status: DC | PRN

## 2023-05-03 NOTE — ED Provider Notes (Signed)
[place] EMERGENCY DEPARTMENT AT [facility] Provider Note   CSN: [id number] Arrival date & time: 05/03/23  1101     History  Chief Complaint  Patient presents with   Back Pain   Leg Pain    Brad Pineda. is a 47 y.o. male with medical history of diabetes, hypertension, depression, anxiety, GERD, hemorrhoids.  Patient presents to ED for evaluation of low back pain, right-sided leg pain.  Patient reports that 4 weeks ago he developed bilateral low back pain that has been progressively worsening since onset.  The patient reports that he works at [employer] and does a lot of heavy lifting for customers.  He reports that after onset of back pain he continued to work at [employer] lifting heavy objects.  States that 1 week ago he developed pain that radiates from the right side of his back down to his right leg.  He was seen at urgent care yesterday and diagnosed with bilateral sciatica.  Patient was provided steroid taper, Flexeril.  Patient was given Toradol shot in the clinic.  He reports that the Toradol shot and the steroids seem to have made his pain worse.  He reports that he is having extensive pain to the back portion of his right leg.  States that the pain was so bad this morning he cried.  Reports that currently he has no pain in his back or leg.  Denies weakness of his lower extremities, groin numbness, bilateral incontinence.  Denies fevers, nausea or vomiting, IV drug use.   Back Pain Associated symptoms: leg pain   Associated symptoms: no numbness and no weakness   Leg Pain Associated symptoms: back pain        Home Medications Prior to Admission medications   Medication Sig Start Date End Date Taking? Authorizing Provider  HYDROcodone-acetaminophen (NORCO/VICODIN) 5-325 MG tablet Take 1 tablet by mouth every 6 (six) hours as needed for severe pain. 05/03/23  Yes Al Decant, PA-C  busPIRone (BUSPAR) 15 MG tablet TAKE ONE TABLET BY MOUTH 2 TIMES  DAILY. 11/18/21   [provider]  Calcium Carbonate-Vitamin D (CALCIUM-VITAMIN D3) 600-125 MG-UNIT TABS Take 1 tablet by mouth in the morning and at bedtime.    [provider]  cetirizine (ZYRTEC) 10 MG tablet Take 10 mg by mouth daily.    [provider]  cyclobenzaprine (FLEXERIL) 10 MG tablet Take 1 tablet (10 mg total) by mouth 2 (two) times daily as needed for muscle spasms. 05/02/23   Radford Pax, NP  Dapagliflozin-metFORMIN HCl ER (XIGDUO XR) 12-998 MG TB24 Take by mouth. 07/24/19   [provider]  fenofibrate (TRICOR) 145 MG tablet TAKE 1 TABLET (145 MG TOTAL) BY MOUTH DAILY. 01/16/18   Porfirio Oar, PA  fish oil-omega-3 fatty acids 1000 MG capsule Take 1 g by mouth daily.     [provider]  fluticasone (FLONASE) 50 MCG/ACT nasal spray SPRAY 2 SPRAYS INTO EACH NOSTRIL EVERY DAY 01/18/18   Porfirio Oar, PA  gabapentin (NEURONTIN) 300 MG capsule Take 300 mg by mouth daily. 03/11/20   [provider]  glipiZIDE (GLUCOTROL XL) 10 MG 24 hr tablet TAKE 1 TABLET (10 MG TOTAL) BY MOUTH DAILY WITH BREAKFAST. Patient taking differently: 2.5 mg. TAKE 1 TABLET (10 MG TOTAL) BY MOUTH DAILY WITH BREAKFAST. 11/13/17   Porfirio Oar, PA  hydrochlorothiazide (HYDRODIURIL) 12.5 MG tablet Take 1 tablet (12.5 mg total) by mouth daily. 01/02/17   Porfirio Oar, PA  ipratropium (  ATROVENT) 0.03 % nasal spray Place 2 sprays into both nostrils every 12 (twelve) hours. 07/18/17   Porfirio Oar, PA  lansoprazole (PREVACID) 15 MG capsule Take 15 mg by mouth daily.    [provider]  losartan (COZAAR) 100 MG tablet TAKE 1 TABLET BY MOUTH EVERY DAY 10/26/17   Porfirio Oar, PA  losartan (COZAAR) 100 MG tablet Take by mouth. 01/31/21   [provider]  montelukast (SINGULAIR) 10 MG tablet Take by mouth. 10/26/21   [provider]  PARoxetine (PAXIL) 40 MG tablet TAKE 1 TABLET (40 MG TOTAL) BY MOUTH DAILY. 01/16/18   Porfirio Oar,  PA  predniSONE (STERAPRED UNI-PAK 21 TAB) 10 MG (21) TBPK tablet Take by mouth daily. Take 6 tabs by mouth daily  for 1 day, then 5 tabs for 1 day, then 4 tabs for 1 day, then 3 tabs for 1 day, 2 tabs for 1 day, then 1 tab by mouth daily for 1 days 05/03/23   Radford Pax, NP  rosuvastatin (CRESTOR) 10 MG tablet Take 10 mg by mouth at bedtime. 04/21/20   [provider]  sitaGLIPtin (JANUVIA) 50 MG tablet Take by mouth. 01/29/22   [provider]  tirzepatide [facility]) 10 MG/0.5ML Pen Inject 10 mg into the skin once a week.    [provider]  TRULICITY 0.75 MG/0.5ML SOPN Inject into the skin. 05/20/20   [provider]      Allergies    Patient has no known allergies.    Review of Systems   Review of Systems  Musculoskeletal:  Positive for back pain.  Neurological:  Negative for weakness and numbness.  All other systems reviewed and are negative.   Physical Exam Updated Vital Signs BP (!) 156/98 (BP Location: Left Arm)   Pulse (!) 106   Temp 98.9 F (37.2 C) (Oral)   Resp 18   Ht 5\' 8"  (1.727 m)   Wt 75.8 kg   SpO2 99%   BMI 25.39 kg/m  Physical Exam Vitals and nursing note reviewed.  Constitutional:      General: He is not in acute distress.    Appearance: Normal appearance. He is not ill-appearing, toxic-appearing or diaphoretic.  HENT:     Head: Normocephalic and atraumatic.     Nose: Nose normal.     Mouth/Throat:     Mouth: Mucous membranes are moist.     Pharynx: Oropharynx is clear.  Eyes:     Extraocular Movements: Extraocular movements intact.     Conjunctiva/sclera: Conjunctivae normal.     Pupils: Pupils are equal, round, and reactive to light.  Cardiovascular:     Rate and Rhythm: Normal rate and regular rhythm.  Pulmonary:     Effort: Pulmonary effort is normal.     Breath sounds: Normal breath sounds. No wheezing.  Abdominal:     General: Abdomen is flat. Bowel sounds are normal.     Palpations: Abdomen is soft.      Tenderness: There is no abdominal tenderness.  Musculoskeletal:     Cervical back: Normal range of motion and neck supple. No tenderness.       Back:     Comments: Pain throughout entire low back.  Nonfocal.  No centralized lumbar spinal tenderness.  Skin:    General: Skin is warm and dry.     Capillary Refill: Capillary refill takes less than 2 seconds.  Neurological:     General: No focal deficit present.     Mental  Status: He is alert and oriented to person, place, and time.     GCS: GCS eye subscore is 4. GCS verbal subscore is 5. GCS motor subscore is 6.     Cranial Nerves: Cranial nerves 2-12 are intact. No cranial nerve deficit.     Sensory: Sensation is intact. No sensory deficit.     Motor: Motor function is intact. No weakness.     Coordination: Coordination is intact. Romberg sign negative.     Comments: 5 out of 5 strength bilateral lower extremities.     ED Results / Procedures / Treatments   Labs (all labs ordered are listed, but only abnormal results are displayed) Labs Reviewed - No data to display  EKG None  Radiology No results found.  Procedures Procedures   Medications Ordered in ED Medications - No data to display  ED Course/ Medical Decision Making/ A&P  Medical Decision Making  47 year old presents to the ED for evaluation.  Please see HPI for further details.  On examination patient is afebrile and nontachycardic.  Lung sounds are clear bilaterally, nonhypoxic.  Abdomen is soft and compressible throughout.  Neurological examination is at baseline.  Patient has 5 out of 5 strength bilateral lower extremities.  He has nonfocal pain to his entire low back that radiates down his right leg.  Patient does appear to have sciatica.  Patient was given steroids, Toradol shot day prior.  Patient was sent home on Flexeril and Medrol Dosepak.  Patient denies any red flag symptoms of low back pain.  Patient here reporting that he does heavy lifting for his  job.  I feel this is probably the cause of the patient's low back pain.  He denies red flag symptoms of low back pain so I do not see need to image him here today.  He has 5 out of 5 strength with bilateral lower extremities.  He actually states that his pain is improved from yesterday however initially when he came into the ED his pain was exquisite.  Advised patient that he will need to follow-up with PCP for physical therapy as well as stretching exercises.  Advised the patient that he will need to return to the ED with any new or worsening symptoms such as bowel or bladder incontinence, weakness of his lower extremities or groin numbness.  He voiced understanding.  He will be sent home with a short course of pain medication which she will take for breakthrough pain at night.  He was advised that pain medication will not be a long-term solution to this issue and he will need to see PCP for further management and care and he voiced understanding.  Advised to continue taking medications prescribed to him at initial urgent care visit.  He had all of his questions answered to his satisfaction.  I have written him out of work for 1 week as well as requested light duty for him.  He is stable to discharge home.     Final Clinical Impression(s) / ED Diagnoses Final diagnoses:  Acute bilateral low back pain with right-sided sciatica    Rx / DC Orders ED Discharge Orders          Ordered    HYDROcodone-acetaminophen (NORCO/VICODIN) 5-325 MG tablet  Every 6 hours PRN        05/03/23 1304              Al Decant, PA-C 05/03/23 1304    Linwood Dibbles, MD 05/04/23 [phone]

## 2023-05-03 NOTE — Discharge Instructions (Addendum)
It was a pleasure taking part in your care today.  As we discussed, I believe that you have sciatica.  Please continue taking muscle relaxer, steroids prescribed by urgent care.  Please also begin taking ibuprofen or Tylenol every 6 hours as needed for pain.  If Tylenol or ibuprofen does not alleviate your pain, you may take prescribed pain medication 5 mg hydrocodone every 6 hours.  Please follow-up with your PCP for further management and care.  Please return to the ED with any new or worsening symptoms such as bowel bladder incontinence, groin numbness or lower extremity weakness.

## 2023-05-03 NOTE — ED Triage Notes (Signed)
Patient presents due to low back pain that radiates into the right leg. He was seen at an urgent care yesterday and was given Toradol, steroid and a Rx. He was then told to come here today because he hasn't noticed any improvement in the pain. Back pain began 4 weeks ago and leg pain began 1 week ago.

## 2023-06-21 ENCOUNTER — Encounter: Payer: Self-pay | Admitting: Gastroenterology

## 2023-07-22 ENCOUNTER — Encounter: Payer: Self-pay | Admitting: Gastroenterology

## 2023-08-12 ENCOUNTER — Encounter: Payer: Self-pay | Admitting: Gastroenterology

## 2023-08-31 ENCOUNTER — Encounter: Payer: Commercial Managed Care - PPO | Admitting: Gastroenterology

## 2023-09-11 ENCOUNTER — Ambulatory Visit (AMBULATORY_SURGERY_CENTER): Payer: Commercial Managed Care - PPO

## 2023-09-11 VITALS — Ht 68.0 in | Wt 169.0 lb

## 2023-09-11 DIAGNOSIS — Z860100 Personal history of colon polyps, unspecified: Secondary | ICD-10-CM

## 2023-09-11 MED ORDER — SUTAB 1479-225-188 MG PO TABS: 12.0000 | ORAL_TABLET | ORAL | 0 refills | Status: DC

## 2023-09-11 NOTE — Progress Notes (Signed)

## 2023-10-08 ENCOUNTER — Encounter: Payer: Self-pay | Admitting: Gastroenterology

## 2023-10-10 ENCOUNTER — Ambulatory Visit (AMBULATORY_SURGERY_CENTER): Payer: Commercial Managed Care - PPO | Admitting: Internal Medicine

## 2023-10-10 ENCOUNTER — Encounter: Payer: Self-pay | Admitting: Internal Medicine

## 2023-10-10 VITALS — BP 99/60 | HR 88 | Temp 98.0°F | Resp 20 | Ht 68.0 in | Wt 169.0 lb

## 2023-10-10 DIAGNOSIS — K629 Disease of anus and rectum, unspecified: Secondary | ICD-10-CM

## 2023-10-10 DIAGNOSIS — K6289 Other specified diseases of anus and rectum: Secondary | ICD-10-CM

## 2023-10-10 DIAGNOSIS — K5730 Diverticulosis of large intestine without perforation or abscess without bleeding: Secondary | ICD-10-CM | POA: Diagnosis not present

## 2023-10-10 DIAGNOSIS — D124 Benign neoplasm of descending colon: Secondary | ICD-10-CM

## 2023-10-10 DIAGNOSIS — D125 Benign neoplasm of sigmoid colon: Secondary | ICD-10-CM | POA: Diagnosis not present

## 2023-10-10 DIAGNOSIS — Z860100 Personal history of colon polyps, unspecified: Secondary | ICD-10-CM

## 2023-10-10 DIAGNOSIS — Z1211 Encounter for screening for malignant neoplasm of colon: Secondary | ICD-10-CM | POA: Diagnosis present

## 2023-10-10 DIAGNOSIS — D123 Benign neoplasm of transverse colon: Secondary | ICD-10-CM

## 2023-10-10 DIAGNOSIS — K644 Residual hemorrhoidal skin tags: Secondary | ICD-10-CM | POA: Diagnosis not present

## 2023-10-10 MED ORDER — SODIUM CHLORIDE 0.9 % IV SOLN: 500.0000 mL | INTRAVENOUS | Status: DC

## 2023-10-10 NOTE — Patient Instructions (Signed)
 -  Resume previous diet. - Continue present medications. - Await pathology results. - Patient given educational handouts related to procedure.   YOU HAD AN ENDOSCOPIC PROCEDURE TODAY AT THE [facility]:   Refer to the procedure report that was given to you for any specific questions about what was found during the examination.  If the procedure report does not answer your questions, please call your gastroenterologist to clarify.  If you requested that your care partner not be given the details of your procedure findings, then the procedure report has been included in a sealed envelope for you to review at your convenience later.  YOU SHOULD EXPECT: Some feelings of bloating in the abdomen. Passage of more gas than usual.  Walking can help get rid of the air that was put into your GI tract during the procedure and reduce the bloating. If you had a lower endoscopy (such as a colonoscopy or flexible sigmoidoscopy) you may notice spotting of blood in your stool or on the toilet paper. If you underwent a bowel prep for your procedure, you may not have a normal bowel movement for a few days.  Please Note:  You might notice some irritation and congestion in your nose or some drainage.  This is from the oxygen used during your procedure.  There is no need for concern and it should clear up in a day or so.  SYMPTOMS TO REPORT IMMEDIATELY:  Following lower endoscopy (colonoscopy or flexible sigmoidoscopy):  Excessive amounts of blood in the stool  Significant tenderness or worsening of abdominal pains  Swelling of the abdomen that is new, acute  Fever of 100F or higher  For urgent or emergent issues, a gastroenterologist can be reached at any hour by calling [phone]. Do not use MyChart messaging for urgent concerns.    DIET:  We do recommend a small meal at first, but then you may proceed to your regular diet.  Drink plenty of fluids but you should avoid alcoholic beverages for 24  hours.  ACTIVITY:  You should plan to take it easy for the rest of today and you should NOT DRIVE or use heavy machinery until tomorrow (because of the sedation medicines used during the test).    FOLLOW UP: Our staff will call the number listed on your records the next business day following your procedure.  We will call around 7:15- 8:00 am to check on you and address any questions or concerns that you may have regarding the information given to you following your procedure. If we do not reach you, we will leave a message.     If any biopsies were taken you will be contacted by phone or by letter within the next 1-3 weeks.  Please call us at [phone] if you have not heard about the biopsies in 3 weeks.    SIGNATURES/CONFIDENTIALITY: You and/or your care partner have signed paperwork which will be entered into your electronic medical record.  These signatures attest to the fact that that the information above on your After Visit Summary has been reviewed and is understood.  Full responsibility of the confidentiality of this discharge information lies with you and/or your care-partner.

## 2023-10-10 NOTE — Progress Notes (Signed)
 Report to PACU, RN, vss, BBS= Clear.

## 2023-10-10 NOTE — Progress Notes (Signed)
 Pt's states no medical or surgical changes since previsit or office visit.

## 2023-10-10 NOTE — Op Note (Signed)
 [facility] Patient Name: Brad Pineda Procedure Date: 10/10/2023 11:00 AM MRN: [id number] Endoscopist: Beverley Fiedler , MD, [id number] Age: 48 Referring MD:  Date of Birth: Dec 11, 1975 Gender: Male Account #: [id number] Procedure:                Colonoscopy Indications:              High risk colon cancer surveillance: Personal                            history of adenoma (10 mm or greater in size), Last                            colonoscopy: November 2021 (7 polyps removed                            adenomatous and hyperplastic) Medicines:                Monitored Anesthesia Care Procedure:                Pre-Anesthesia Assessment:                           - Prior to the procedure, a History and Physical                            was performed, and patient medications and                            allergies were reviewed. The patient's tolerance of                            previous anesthesia was also reviewed. The risks                            and benefits of the procedure and the sedation                            options and risks were discussed with the patient.                            All questions were answered, and informed consent                            was obtained. Prior Anticoagulants: The patient has                            taken no anticoagulant or antiplatelet agents. ASA                            Grade Assessment: II - A patient with mild systemic                            disease. After reviewing the risks and benefits,  the patient was deemed in satisfactory condition to                            undergo the procedure.                           After obtaining informed consent, the colonoscope                            was passed under direct vision. Throughout the                            procedure, the patient's blood pressure, pulse, and                            oxygen saturations were monitored  continuously. The                            CF [id number] #[id number] was introduced through the anus                            and advanced to the cecum, identified by                            appendiceal orifice and ileocecal valve. The                            colonoscopy was performed without difficulty. The                            patient tolerated the procedure well. The quality                            of the bowel preparation was good. The ileocecal                            valve, appendiceal orifice, and rectum were                            photographed. Scope In: 11:36:54 AM Scope Out: 11:59:20 AM Scope Withdrawal Time: 0 hours 17 minutes 5 seconds  Total Procedure Duration: 0 hours 22 minutes 26 seconds  Findings:                 The digital rectal exam was normal. Skin tags                            present.                           Four sessile polyps were found in the transverse                            colon. The polyps were 4 to 7 mm in size. These  polyps were removed with a cold snare. Resection                            and retrieval were complete.                           A 3 mm polyp was found in the descending colon. The                            polyp was sessile. The polyp was removed with a                            cold snare. Resection and retrieval were complete.                           Two sessile polyps were found in the sigmoid colon.                            The polyps were 3 to 8 mm in size. These polyps                            were removed with a cold snare. Resection and                            retrieval were complete.                           Multiple medium-mouthed and small-mouthed                            diverticula were found in the sigmoid colon and                            descending colon.                           A single (solitary) six mm ulcer was found in the                             distal rectum. No bleeding was present. Biopsies                            were taken with a cold forceps for histology. Query                            if related to rectal prolapse. Complications:            No immediate complications. Estimated Blood Loss:     Estimated blood loss was minimal. Impression:               - Four 4 to 7 mm polyps in the transverse colon,                            removed with a  cold snare. Resected and retrieved.                           - One 3 mm polyp in the descending colon, removed                            with a cold snare. Resected and retrieved.                           - Two 3 and 8 mm polyps in the sigmoid colon,                            removed with a cold snare. Resected and retrieved.                           - Moderate diverticulosis in the sigmoid colon and                            in the descending colon.                           - A single (solitary) ulcer in the distal rectum.                            Possibly prolapse related. Biopsied. Recommendation:           - Patient has a contact number available for                            emergencies. The signs and symptoms of potential                            delayed complications were discussed with the                            patient. Return to normal activities tomorrow.                            Written discharge instructions were provided to the                            patient.                           - Resume previous diet.                           - Continue present medications.                           - Await pathology results.                           - Repeat colonoscopy is recommended for  surveillance. The colonoscopy date will be                            determined after pathology results from today's                            exam become available for review. Beverley Fiedler, MD 10/10/2023 12:05:33 PM This report has  been signed electronically.

## 2023-10-10 NOTE — Progress Notes (Signed)
 GASTROENTEROLOGY PROCEDURE H&P NOTE   Primary Care Physician: Porfirio Oar, PA    Reason for Procedure:   Hx of adenomatous polyps  Plan:    Colonoscopy  Patient is appropriate for endoscopic procedure(s) in the ambulatory ([facility]) setting.  The nature of the procedure, as well as the risks, benefits, and alternatives were carefully and thoroughly reviewed with the patient. Ample time for discussion and questions allowed. The patient understood, was satisfied, and agreed to proceed.     HPI: Brad Pineda. is a 48 y.o. male who presents for colonoscopy.  Medical history as below.  Tolerated the prep.  No recent chest pain or shortness of breath.  No abdominal pain today.  Past Medical History:  Diagnosis Date   Acute meniscal tear of left knee 2007   Acute meniscal tear of left knee 2007   Allergy    Anxiety    Depression    Diabetes mellitus type 2, noninsulin dependent (HCC)    Family history of anesthesia complication    states father is hard to wake up post-op   GERD (gastroesophageal reflux disease)    Hyperlipidemia    Hypertension    under control with meds., has been on med. since 2009   Rectal prolapse    Seasonal allergies    Sleep apnea    uses CPAP nightly   Trimalleolar fracture of left ankle 02/06/2014    Past Surgical History:  Procedure Laterality Date   FOOT SURGERY     ORIF ANKLE FRACTURE Left 02/10/2014   Procedure: LEFT ANKLE: FRACTURE OPEN TREATMENT TRIMALLEOLAR ANKLE INCLUDES INTERNAL FIXATION WITH FIXATION OF POSTERIOR LIP;  Surgeon: Sheral Apley, MD;  Location:  SURGERY CENTER;  Service: Orthopedics;  Laterality: Left;    Prior to Admission medications   Medication Sig Start Date End Date Taking? Authorizing Provider  busPIRone (BUSPAR) 15 MG tablet TAKE ONE TABLET BY MOUTH 2 TIMES DAILY. 11/18/21  Yes [provider]  Calcium Carbonate-Vitamin D (CALCIUM-VITAMIN D3) 600-125 MG-UNIT TABS Take 1 tablet by mouth in  the morning and at bedtime.   Yes [provider]  cetirizine (ZYRTEC) 10 MG tablet Take 10 mg by mouth daily.   Yes [provider]  Dapagliflozin-metFORMIN HCl ER (XIGDUO XR) 12-998 MG TB24 Take by mouth. 07/24/19  Yes [provider]  fluticasone (FLONASE) 50 MCG/ACT nasal spray SPRAY 2 SPRAYS INTO EACH NOSTRIL EVERY DAY 01/18/18  Yes Jeffery, Chelle, PA  gabapentin (NEURONTIN) 300 MG capsule Take 300 mg by mouth daily. 03/11/20  Yes [provider]  hydrochlorothiazide (HYDRODIURIL) 12.5 MG tablet Take 1 tablet (12.5 mg total) by mouth daily. 01/02/17  Yes Jeffery, Chelle, PA  ipratropium (ATROVENT) 0.03 % nasal spray Place 2 sprays into both nostrils every 12 (twelve) hours. 07/18/17  Yes Jeffery, Chelle, PA  lansoprazole (PREVACID) 15 MG capsule Take 15 mg by mouth daily.   Yes [provider]  losartan (COZAAR) 100 MG tablet Take by mouth. 01/31/21  Yes [provider]  montelukast (SINGULAIR) 10 MG tablet Take by mouth. 10/26/21  Yes [provider]  PARoxetine (PAXIL) 40 MG tablet TAKE 1 TABLET (40 MG TOTAL) BY MOUTH DAILY. 01/16/18  Yes Jeffery, Chelle, PA  rosuvastatin (CRESTOR) 10 MG tablet Take 10 mg by mouth at bedtime. 04/21/20  Yes [provider]  MOUNJARO 12.5 MG/0.5ML Pen  09/01/23   [provider]    Current Outpatient Medications  Medication Sig Dispense Refill   busPIRone (BUSPAR) 15  MG tablet TAKE ONE TABLET BY MOUTH 2 TIMES DAILY.     Calcium Carbonate-Vitamin D (CALCIUM-VITAMIN D3) 600-125 MG-UNIT TABS Take 1 tablet by mouth in the morning and at bedtime.     cetirizine (ZYRTEC) 10 MG tablet Take 10 mg by mouth daily.     Dapagliflozin-metFORMIN HCl ER (XIGDUO XR) 12-998 MG TB24 Take by mouth.     fluticasone (FLONASE) 50 MCG/ACT nasal spray SPRAY 2 SPRAYS INTO EACH NOSTRIL EVERY DAY 19 g 2   gabapentin (NEURONTIN) 300 MG capsule Take 300 mg by mouth daily.     hydrochlorothiazide (HYDRODIURIL)  12.5 MG tablet Take 1 tablet (12.5 mg total) by mouth daily. 90 tablet 3   ipratropium (ATROVENT) 0.03 % nasal spray Place 2 sprays into both nostrils every 12 (twelve) hours. 90 mL 3   lansoprazole (PREVACID) 15 MG capsule Take 15 mg by mouth daily.     losartan (COZAAR) 100 MG tablet Take by mouth.     montelukast (SINGULAIR) 10 MG tablet Take by mouth.     PARoxetine (PAXIL) 40 MG tablet TAKE 1 TABLET (40 MG TOTAL) BY MOUTH DAILY. 90 tablet 3   rosuvastatin (CRESTOR) 10 MG tablet Take 10 mg by mouth at bedtime.     MOUNJARO 12.5 MG/0.5ML Pen      Current Facility-Administered Medications  Medication Dose Route Frequency Provider Last Rate Last Admin   0.9 %  sodium chloride infusion  500 mL Intravenous Continuous Armanie Martine, Carie Caddy, MD        Allergies as of 10/10/2023 - Review Complete 10/10/2023  Allergen Reaction Noted   Bee pollen Other (See Comments) 08/08/2022    Family History  Problem Relation Age of Onset   Hypertension Mother    Diabetes Mother    Colon polyps Mother    Cancer Mother        neuroendocrine tumor top of pancreas   Anesthesia problems Father        hard to wake up post-op   Colon polyps Father    Kidney Stones Father    Colon cancer Neg Hx    Esophageal cancer Neg Hx    Rectal cancer Neg Hx    Stomach cancer Neg Hx    Inflammatory bowel disease Neg Hx    Pancreatic cancer Neg Hx     Social History   Socioeconomic History   Marital status: Married    Spouse name: Dorine   Number of children: 0   Years of education: 16   Highest education level: Not on file  Occupational History   Occupation: Equities trader: [employer]  Tobacco Use   Smoking status: Never   Smokeless tobacco: Never  Vaping Use   Vaping status: Never Used  Substance and Sexual Activity   Alcohol use: No   Drug use: No   Sexual activity: Yes    Partners: Female  Other Topics Concern   Not on file  Social History Narrative   Patient is married  since 12/2010(Dorine) and Lives with his wife.   Patient is working full-time.   Patient has a BA degree.   Patient is right-handed.   Patient drinks two cups of soda and tea occasionally.   Social Drivers of Corporate investment banker Strain: Low Risk  (08/15/2023)   Received from [facility]   Overall Financial Resource Strain (CARDIA)    Difficulty of Paying Living Expenses: Not hard at all  Food Insecurity: No Food Insecurity (08/15/2023)  Received [facility] Vital Sign    Worried About Running Out of Food in the Last Year: Never true    Ran Out of Food in the Last Year: Never true  Transportation Needs: No Transportation Needs (08/15/2023)   Received from [facility] - Transportation    Lack of Transportation (Medical): No    Lack of Transportation (Non-Medical): No  Physical Activity: Inactive (05/01/2022)   Received from [facility]   Exercise Vital Sign    Days of Exercise per Week: 0 days    Minutes of Exercise per Session: 0 min  Stress: Stress Concern Present (05/01/2022)   Received from [facility] - Occupational Stress Questionnaire    Feeling of Stress : To some extent  Social Connections: Unknown (05/02/2023)   Received from [facility]   Social Network    Social Network: Not on file  Intimate Partner Violence: Unknown (05/02/2023)   Received from [facility]   HITS    Physically Hurt: Not on file    Insult or Talk Down To: Not on file    Threaten Physical Harm: Not on file    Scream or Curse: Not on file    Physical Exam: Vital signs in last 24 hours: @BP  135/74   Pulse 94   Temp 98 F (36.7 C)   Ht 5\' 8"  (1.727 m)   Wt 169 lb (76.7 kg)   SpO2 96%   BMI 25.70 kg/m  GEN: NAD EYE: Sclerae anicteric ENT: MMM CV: Non-tachycardic Pulm: CTA b/l GI: Soft, NT/ND NEURO:  Alert & Oriented x 3   Erick Blinks, MD [facility]  10/10/2023 11:29 AM

## 2023-10-11 ENCOUNTER — Telehealth: Payer: Self-pay

## 2023-10-11 NOTE — Telephone Encounter (Signed)
 No answer after follow up call. Voice message left.

## 2023-10-12 LAB — SURGICAL PATHOLOGY

## 2023-10-22 ENCOUNTER — Encounter: Payer: Self-pay | Admitting: Internal Medicine

## 2024-07-22 ENCOUNTER — Inpatient Hospital Stay: Admission: RE | Admit: 2024-07-22 | Discharge: 2024-07-22 | Attending: Family Medicine

## 2024-07-22 VITALS — BP 135/77 | HR 97 | Temp 98.1°F | Resp 16

## 2024-07-22 DIAGNOSIS — R070 Pain in throat: Secondary | ICD-10-CM

## 2024-07-22 DIAGNOSIS — B9789 Other viral agents as the cause of diseases classified elsewhere: Secondary | ICD-10-CM

## 2024-07-22 DIAGNOSIS — J988 Other specified respiratory disorders: Secondary | ICD-10-CM

## 2024-07-22 LAB — POCT RAPID STREP A (OFFICE): Rapid Strep A Screen: NEGATIVE

## 2024-07-22 MED ORDER — CETIRIZINE HCL 10 MG PO TABS: 10.0000 mg | ORAL_TABLET | Freq: Every day | ORAL | 0 refills | Status: AC

## 2024-07-22 MED ORDER — PSEUDOEPHEDRINE HCL 30 MG PO TABS: 30.0000 mg | ORAL_TABLET | Freq: Three times a day (TID) | ORAL | 0 refills | Status: AC | PRN

## 2024-07-22 MED ORDER — IBUPROFEN 600 MG PO TABS: 600.0000 mg | ORAL_TABLET | Freq: Four times a day (QID) | ORAL | 0 refills | Status: AC | PRN

## 2024-07-22 MED ORDER — PROMETHAZINE-DM 6.25-15 MG/5ML PO SYRP: 5.0000 mL | ORAL_SOLUTION | Freq: Three times a day (TID) | ORAL | 0 refills | Status: AC | PRN

## 2024-07-22 NOTE — ED Triage Notes (Signed)
 Patient reports tickle at bottom of throat on Friday with dry cough. Saturday throat pain progressed. Daily nasal spray. Patient did e-visit with [facility] provider and they prescribed Benzonatate  for cough , but symptoms have not improved. No fever. NAD. Steady gait.

## 2024-07-22 NOTE — Discharge Instructions (Addendum)
 We will manage this as a viral respiratory illness. For sore throat or cough try using a honey-based tea. Use 3 teaspoons of honey with juice squeezed from half lemon. Place shaved pieces of ginger into 1/2-1 cup of water and warm over stove top. Then mix the ingredients and repeat every 4 hours as needed. Please take ibuprofen  600mg  every 6 hours with food alternating with OR taken together with Tylenol  500mg -650mg  every 6 hours for throat pain, fevers, aches and pains. Hydrate very well with at least 2 liters of water. Eat light meals such as soups (chicken and noodles, vegetable, chicken and wild rice).  Do not eat foods that you are allergic to.  Taking an antihistamine like Zyrtec  (10mg  daily) can help against postnasal drainage, sinus congestion which can cause sinus pain, sinus headaches, throat pain, painful swallowing, coughing.  You can take this together with pseudoephedrine  (Sudafed) at a dose of 30mg  3 times a day or twice daily as needed for the same kind of nasal drip, congestion.

## 2024-07-22 NOTE — ED Provider Notes (Signed)
 [facility]  Note:  This document was prepared using Conservation officer, historic buildings and may include unintentional dictation errors.  MRN: [id number] DOB: 1976-03-28  Subjective:   Brad Pineda. is a 48 y.o. male presenting for 4-day history of dry cough, scratchy throat.  Throat discomfort has progressed, has painful swallowing now, runny and stuffy nose, shortness of breath, wheezing.  Has used nasal spray daily.  Did an e-visit.  Was recommended to have supportive care with benzonatate  capsules and Tylenol  for viral respiratory infection.  No fever, chest pain. Has a history of type 2 diabetes treated without insulin.  Last A1c was 6% July 2025. No asthma. No smoking of any kind including cigarettes, cigars, vaping, marijuana use.    Current Outpatient Medications  Medication Instructions   busPIRone (BUSPAR) 15 MG tablet TAKE ONE TABLET BY MOUTH 2 TIMES DAILY.   Calcium Carbonate-Vitamin D (CALCIUM-VITAMIN D3) 600-125 MG-UNIT TABS 1 tablet, 2 times daily   cetirizine  (ZYRTEC ) 10 mg, Daily   Dapagliflozin -metFORMIN  HCl ER (XIGDUO XR) 12-998 MG TB24 Take by mouth.   fluticasone  (FLONASE ) 50 MCG/ACT nasal spray SPRAY 2 SPRAYS INTO EACH NOSTRIL EVERY DAY   gabapentin (NEURONTIN) 300 mg, Daily   hydrochlorothiazide  (HYDRODIURIL ) 12.5 mg, Oral, Daily   ipratropium (ATROVENT ) 0.03 % nasal spray 2 sprays, Each Nare, Every 12 hours   lansoprazole (PREVACID) 15 mg, Daily   losartan  (COZAAR ) 100 MG tablet Take by mouth.   montelukast (SINGULAIR) 10 MG tablet Take by mouth.   MOUNJARO 12.5 MG/0.5ML Pen    PARoxetine  (PAXIL ) 40 MG tablet TAKE 1 TABLET (40 MG TOTAL) BY MOUTH DAILY.   rosuvastatin (CRESTOR) 10 mg, Daily at bedtime    Allergies[1]  Past Medical History:  Diagnosis Date   Acute meniscal tear of left knee 2007   Acute meniscal tear of left knee 2007   Allergy    Anxiety    Depression    Diabetes mellitus type 2, noninsulin dependent (HCC)     Family history of anesthesia complication    states father is hard to wake up post-op   GERD (gastroesophageal reflux disease)    Hyperlipidemia    Hypertension    under control with meds., has been on med. since 2009   Rectal prolapse    Seasonal allergies    Sleep apnea    uses CPAP nightly   Trimalleolar fracture of left ankle 02/06/2014     Past Surgical History:  Procedure Laterality Date   FOOT SURGERY     ORIF ANKLE FRACTURE Left 02/10/2014   Procedure: LEFT ANKLE: FRACTURE OPEN TREATMENT TRIMALLEOLAR ANKLE INCLUDES INTERNAL FIXATION WITH FIXATION OF POSTERIOR LIP;  Surgeon: Evalene JONETTA Chancy, MD;  Location: [facility];  Service: Orthopedics;  Laterality: Left;    Family History  Problem Relation Age of Onset   Hypertension Mother    Diabetes Mother    Colon polyps Mother    Cancer Mother        neuroendocrine tumor top of pancreas   Anesthesia problems Father        hard to wake up post-op   Colon polyps Father    Kidney Stones Father    Colon cancer Neg Hx    Esophageal cancer Neg Hx    Rectal cancer Neg Hx    Stomach cancer Neg Hx    Inflammatory bowel disease Neg Hx    Pancreatic cancer Neg Hx     Social History   Occupational  History   Occupation: Equities Trader: [employer]  Tobacco Use   Smoking status: Never   Smokeless tobacco: Never  Vaping Use   Vaping status: Never Used  Substance and Sexual Activity   Alcohol use: No   Drug use: No   Sexual activity: Yes    Partners: Female     ROS   Objective:   Vitals: BP 135/77 (BP Location: Right Arm)   Pulse 97   Temp 98.1 F (36.7 C) (Oral)   Resp 16   Physical Exam Constitutional:      General: He is not in acute distress.    Appearance: Normal appearance. He is well-developed and normal weight. He is not ill-appearing, toxic-appearing or diaphoretic.  HENT:     Head: Normocephalic and atraumatic.     Right Ear: Tympanic membrane, ear canal and  external ear normal. No drainage, swelling or tenderness. No middle ear effusion. There is no impacted cerumen. Tympanic membrane is not erythematous or bulging.     Left Ear: Tympanic membrane, ear canal and external ear normal. No drainage, swelling or tenderness.  No middle ear effusion. There is no impacted cerumen. Tympanic membrane is not erythematous or bulging.     Nose: Nose normal. No congestion or rhinorrhea.     Mouth/Throat:     Mouth: Mucous membranes are moist.     Pharynx: No pharyngeal swelling, oropharyngeal exudate, posterior oropharyngeal erythema or uvula swelling.     Tonsils: No tonsillar exudate or tonsillar abscesses. 0 on the right. 0 on the left.  Eyes:     General: No scleral icterus.       Right eye: No discharge.        Left eye: No discharge.     Extraocular Movements: Extraocular movements intact.     Conjunctiva/sclera: Conjunctivae normal.  Cardiovascular:     Rate and Rhythm: Normal rate and regular rhythm.     Heart sounds: Normal heart sounds. No murmur heard.    No friction rub. No gallop.  Pulmonary:     Effort: Pulmonary effort is normal. No respiratory distress.     Breath sounds: Normal breath sounds. No stridor. No wheezing, rhonchi or rales.  Musculoskeletal:     Cervical back: Normal range of motion and neck supple. No rigidity. No muscular tenderness.  Neurological:     General: No focal deficit present.     Mental Status: He is alert and oriented to person, place, and time.  Psychiatric:        Mood and Affect: Mood normal.        Behavior: Behavior normal.        Thought Content: Thought content normal.        Judgment: Judgment normal.    Results for orders placed or performed during the hospital encounter of 07/22/24 (from the past 24 hours)  POCT rapid strep A     Status: None   Collection Time: 07/22/24 10:11 AM  Result Value Ref Range   Rapid Strep A Screen Negative Negative   Assessment and Plan :   PDMP not reviewed this  encounter.  1. Viral respiratory infection   2. Throat pain    Deferred imaging given clear cardiopulmonary exam, hemodynamically stable vital signs. Suspect viral URI, viral syndrome. Physical exam findings reassuring and vital signs stable for discharge. Advised supportive care, offered symptomatic relief. Counseled patient on potential for adverse effects with medications prescribed/recommended today, ER and return-to-clinic precautions discussed, patient  verbalized understanding.       [1]  Allergies Allergen Reactions   Bee Pollen Other (See Comments)    Watery eyes and congestion     Christopher Savannah, PA-C 07/22/24 1019

## 2024-07-26 ENCOUNTER — Ambulatory Visit
Admission: RE | Admit: 2024-07-26 | Discharge: 2024-07-26 | Disposition: A | Discharge status: Home / Self Care | Source: Ambulatory Visit | Attending: Family Medicine | Admitting: Family Medicine

## 2024-07-26 ENCOUNTER — Ambulatory Visit (INDEPENDENT_AMBULATORY_CARE_PROVIDER_SITE_OTHER)

## 2024-07-26 VITALS — BP 118/75 | HR 115 | Temp 97.5°F | Resp 20

## 2024-07-26 DIAGNOSIS — J40 Bronchitis, not specified as acute or chronic: Secondary | ICD-10-CM | POA: Diagnosis not present

## 2024-07-26 DIAGNOSIS — R000 Tachycardia, unspecified: Secondary | ICD-10-CM | POA: Diagnosis not present

## 2024-07-26 DIAGNOSIS — J329 Chronic sinusitis, unspecified: Secondary | ICD-10-CM

## 2024-07-26 DIAGNOSIS — R0789 Other chest pain: Secondary | ICD-10-CM | POA: Diagnosis not present

## 2024-07-26 MED ORDER — AMOXICILLIN-POT CLAVULANATE 875-125 MG PO TABS: 1.0000 | ORAL_TABLET | Freq: Two times a day (BID) | ORAL | 0 refills | Status: AC

## 2024-07-26 MED ORDER — PREDNISONE 10 MG PO TABS: 30.0000 mg | ORAL_TABLET | Freq: Every day | ORAL | 0 refills | Status: AC

## 2024-07-26 NOTE — Discharge Instructions (Signed)
 Start Augmentin  and prednisone  to help with your sinobronchitis infection.  Continue using Zyrtec  and cough syrup as needed.

## 2024-07-26 NOTE — ED Triage Notes (Signed)
 Pt c/o prod cough, sore throat, head/chest congestion, CP, SHOB-sx x 1 week-states was seen here 12/16 and feels no better-NAD-steady gait

## 2024-07-26 NOTE — ED Provider Notes (Signed)
 [facility]  Note:  This document was prepared using Conservation officer, historic buildings and may include unintentional dictation errors.  MRN: [id number] DOB: 04-23-1976  Subjective:   Brad Pineda. is a 48 y.o. male presenting for 1 week history of persistent and worsening sinus congestion, drainage, scratchy throat, coughing, chest congestion, shortness of breath and chest pain.  Was seen 07/22/2024, had a negative strep test.  Imaging was deferred given clear pulmonary exam.  Has followed recommendations for supportive care.  No asthma.  Has diabetes that is well-controlled.  Current Outpatient Medications  Medication Instructions   busPIRone (BUSPAR) 15 MG tablet TAKE ONE TABLET BY MOUTH 2 TIMES DAILY.   Calcium Carbonate-Vitamin D (CALCIUM-VITAMIN D3) 600-125 MG-UNIT TABS 1 tablet, 2 times daily   cetirizine  (ZYRTEC  ALLERGY) 10 mg, Oral, Daily   Dapagliflozin -metFORMIN  HCl ER (XIGDUO XR) 12-998 MG TB24 Take by mouth.   fluticasone  (FLONASE ) 50 MCG/ACT nasal spray SPRAY 2 SPRAYS INTO EACH NOSTRIL EVERY DAY   gabapentin (NEURONTIN) 300 mg, Daily   hydrochlorothiazide  (HYDRODIURIL ) 12.5 mg, Oral, Daily   ibuprofen  (ADVIL ) 600 mg, Oral, Every 6 hours PRN   ipratropium (ATROVENT ) 0.03 % nasal spray 2 sprays, Each Nare, Every 12 hours   lansoprazole (PREVACID) 15 mg, Daily   losartan  (COZAAR ) 100 MG tablet Take by mouth.   montelukast (SINGULAIR) 10 MG tablet Take by mouth.   MOUNJARO 12.5 MG/0.5ML Pen    PARoxetine  (PAXIL ) 40 MG tablet TAKE 1 TABLET (40 MG TOTAL) BY MOUTH DAILY.   promethazine -dextromethorphan (PROMETHAZINE -DM) 6.25-15 MG/5ML syrup 5 mLs, Oral, 3 times daily PRN   pseudoephedrine  (SUDAFED) 30 mg, Oral, Every 8 hours PRN   rosuvastatin (CRESTOR) 10 mg, Daily at bedtime    Allergies[1]  Past Medical History:  Diagnosis Date   Acute meniscal tear of left knee 2007   Acute meniscal tear of left knee 2007   Allergy    Anxiety     Depression    Diabetes mellitus type 2, noninsulin dependent (HCC)    Family history of anesthesia complication    states father is hard to wake up post-op   GERD (gastroesophageal reflux disease)    Hyperlipidemia    Hypertension    under control with meds., has been on med. since 2009   Rectal prolapse    Seasonal allergies    Sleep apnea    uses CPAP nightly   Trimalleolar fracture of left ankle 02/06/2014     Past Surgical History:  Procedure Laterality Date   FOOT SURGERY     ORIF ANKLE FRACTURE Left 02/10/2014   Procedure: LEFT ANKLE: FRACTURE OPEN TREATMENT TRIMALLEOLAR ANKLE INCLUDES INTERNAL FIXATION WITH FIXATION OF POSTERIOR LIP;  Surgeon: Evalene JONETTA Chancy, MD;  Location: [facility];  Service: Orthopedics;  Laterality: Left;    Family History  Problem Relation Age of Onset   Hypertension Mother    Diabetes Mother    Colon polyps Mother    Cancer Mother        neuroendocrine tumor top of pancreas   Anesthesia problems Father        hard to wake up post-op   Colon polyps Father    Kidney Stones Father    Colon cancer Neg Hx    Esophageal cancer Neg Hx    Rectal cancer Neg Hx    Stomach cancer Neg Hx    Inflammatory bowel disease Neg Hx    Pancreatic cancer Neg Hx  Social History   Occupational History   Occupation: Equities Trader: [employer]  Tobacco Use   Smoking status: Never   Smokeless tobacco: Never  Vaping Use   Vaping status: Never Used  Substance and Sexual Activity   Alcohol use: No   Drug use: No   Sexual activity: Not on file     ROS   Objective:   Vitals: BP 118/75 (BP Location: Left Arm)   Pulse (!) 115   Temp (!) 97.5 F (36.4 C) (Oral)   Resp 20   SpO2 97%   Physical Exam Constitutional:      General: He is not in acute distress.    Appearance: Normal appearance. He is well-developed and normal weight. He is not ill-appearing, toxic-appearing or diaphoretic.  HENT:     Head:  Normocephalic and atraumatic.     Right Ear: Tympanic membrane, ear canal and external ear normal. No drainage, swelling or tenderness. No middle ear effusion. There is no impacted cerumen. Tympanic membrane is not erythematous or bulging.     Left Ear: Tympanic membrane, ear canal and external ear normal. No drainage, swelling or tenderness.  No middle ear effusion. There is no impacted cerumen. Tympanic membrane is not erythematous or bulging.     Nose: Congestion and rhinorrhea present.     Mouth/Throat:     Mouth: Mucous membranes are moist.     Pharynx: Posterior oropharyngeal erythema present. No oropharyngeal exudate.  Eyes:     General: No scleral icterus.       Right eye: No discharge.        Left eye: No discharge.     Extraocular Movements: Extraocular movements intact.     Conjunctiva/sclera: Conjunctivae normal.  Cardiovascular:     Rate and Rhythm: Regular rhythm. Tachycardia present.     Heart sounds: Normal heart sounds. No murmur heard.    No friction rub. No gallop.  Pulmonary:     Effort: Pulmonary effort is normal. No respiratory distress.     Breath sounds: Normal breath sounds. No stridor. No wheezing, rhonchi or rales.  Musculoskeletal:     Cervical back: Normal range of motion and neck supple. No rigidity. No muscular tenderness.  Neurological:     General: No focal deficit present.     Mental Status: He is alert and oriented to person, place, and time.  Psychiatric:        Mood and Affect: Mood normal.        Behavior: Behavior normal.        Thought Content: Thought content normal.    ED ECG REPORT   Date: 07/26/2024  EKG Time: 12:40 PM  Rate: 106bpm  Rhythm: sinus tachycardia  Axis: normal  Intervals:none  ST&T Change: Nonspecific T wave flattening in lead aVL  Narrative Interpretation: Sinus tachycardia at 106 bpm with nonspecific T wave changes above.  Comparable to priors including tachycardia.  DG Chest 2 View Result Date: 07/26/2024 EXAM: 2  VIEW(S) XRAY OF THE CHEST 07/26/2024 12:32:57 PM COMPARISON: PA and lateral radiographs of the chest dated 02/29/2012. CLINICAL HISTORY: chest pain, shob FINDINGS: LUNGS AND PLEURA: No focal pulmonary opacity. No pleural effusion. No pneumothorax. HEART AND MEDIASTINUM: No acute abnormality of the cardiac and mediastinal silhouettes. BONES AND SOFT TISSUES: Thoracic degenerative changes. IMPRESSION: 1. No acute cardiopulmonary process. 2. Thoracic spine degenerative changes. Electronically signed by: Evalene Coho MD 07/26/2024 12:38 PM EST RP Workstation: [id number]    Assessment and Plan :  PDMP not reviewed this encounter.  1. Sinobronchitis   2. Atypical chest pain   3. Tachycardia, unspecified      Will manage for sinobronchitis with Augmentin , prednisone  and general supportive care.  Counseled patient on potential for adverse effects with medications prescribed/recommended today, ER and return-to-clinic precautions discussed, patient verbalized understanding.     [1]  Allergies Allergen Reactions   Bee Pollen Other (See Comments)    Watery eyes and congestion     Christopher Savannah, PA-C 07/26/24 1246  "
